# Patient Record
Sex: Female | Born: 1950
Health system: Southern US, Community
[De-identification: ages and names within clinical notes are randomized; demographics above are authoritative.]

## PROBLEM LIST (undated history)

## (undated) DIAGNOSIS — E785 Hyperlipidemia, unspecified: Secondary | ICD-10-CM

## (undated) DIAGNOSIS — I10 Essential (primary) hypertension: Secondary | ICD-10-CM

## (undated) DIAGNOSIS — E119 Type 2 diabetes mellitus without complications: Secondary | ICD-10-CM

## (undated) HISTORY — PX: REFRACTIVE SURGERY: SHX103

## (undated) HISTORY — DX: Type 2 diabetes mellitus without complications: E11.9

## (undated) HISTORY — PX: APPENDECTOMY: SHX54

## (undated) HISTORY — DX: Hyperlipidemia, unspecified: E78.5

## (undated) HISTORY — DX: Essential (primary) hypertension: I10

---

## 1973-09-13 HISTORY — PX: TONSILLECTOMY AND ADENOIDECTOMY: SHX28

## 2006-11-08 DIAGNOSIS — L538 Other specified erythematous conditions: Secondary | ICD-10-CM | POA: Insufficient documentation

## 2011-06-18 ENCOUNTER — Ambulatory Visit: Payer: Self-pay | Admitting: Internal Medicine

## 2011-06-23 ENCOUNTER — Ambulatory Visit: Payer: Self-pay | Admitting: Internal Medicine

## 2012-03-23 ENCOUNTER — Ambulatory Visit: Payer: Self-pay | Admitting: Family Medicine

## 2012-03-23 LAB — HM MAMMOGRAPHY

## 2013-06-22 ENCOUNTER — Ambulatory Visit: Payer: Self-pay | Admitting: Family Medicine

## 2014-01-31 DIAGNOSIS — M2390 Unspecified internal derangement of unspecified knee: Secondary | ICD-10-CM | POA: Insufficient documentation

## 2015-03-30 ENCOUNTER — Other Ambulatory Visit: Payer: Self-pay | Admitting: Family Medicine

## 2015-07-18 ENCOUNTER — Encounter: Payer: Self-pay | Admitting: Family Medicine

## 2015-07-18 ENCOUNTER — Ambulatory Visit (INDEPENDENT_AMBULATORY_CARE_PROVIDER_SITE_OTHER): Payer: PPO | Admitting: Family Medicine

## 2015-07-18 VITALS — BP 142/90 | HR 69 | Temp 98.3°F | Resp 16 | Ht 63.0 in | Wt 199.8 lb

## 2015-07-18 DIAGNOSIS — I10 Essential (primary) hypertension: Secondary | ICD-10-CM | POA: Insufficient documentation

## 2015-07-18 DIAGNOSIS — E7800 Pure hypercholesterolemia, unspecified: Secondary | ICD-10-CM | POA: Insufficient documentation

## 2015-07-18 DIAGNOSIS — E119 Type 2 diabetes mellitus without complications: Secondary | ICD-10-CM | POA: Insufficient documentation

## 2015-07-18 DIAGNOSIS — E049 Nontoxic goiter, unspecified: Secondary | ICD-10-CM | POA: Insufficient documentation

## 2015-07-18 DIAGNOSIS — R490 Dysphonia: Secondary | ICD-10-CM | POA: Diagnosis not present

## 2015-07-18 DIAGNOSIS — J309 Allergic rhinitis, unspecified: Secondary | ICD-10-CM | POA: Insufficient documentation

## 2015-07-18 MED ORDER — AMLODIPINE BESYLATE 5 MG PO TABS: 5.0000 mg | ORAL_TABLET | Freq: Every day | ORAL | Status: DC

## 2015-07-18 NOTE — Progress Notes (Signed)
Subjective:     Patient ID: Anne Parrish, female   DOB: February 13, 1951, 64 y.o.   MRN: [id number]  HPI  Chief Complaint  Patient presents with  . Hoarse    Patient comes in office today with concerns of hoarsness and fullness feeling in her throat for the past 4 days. Patient denies any cold like symptoms or sore throat.   States she is generally fatigued working for the [employer] Has run out of her bp medication. Denies overuse of her voice. Does take antacids at least once/week for heartburn. She occasionally notices heartburn after lying down.   Review of Systems  Constitutional: Negative for fever and chills.  HENT: Negative for postnasal drip and sore throat.   Respiratory: Negative for cough.        Objective:   Physical Exam  Constitutional: She appears well-developed and well-nourished. No distress.  HENT:  Mouth/Throat: Oropharynx is clear and moist.  Tonsils absent  Pulmonary/Chest: Breath sounds normal.  Lymphadenopathy:    She has no cervical adenopathy.       Assessment:    1. Hoarseness: ? Mediated by reflux   2. Essential hypertension - amLODipine (NORVASC) 5 MG tablet; Take 1 tablet (5 mg total) by mouth daily.  Dispense: 90 tablet; Refill: 0    Plan:    Dexilant 60 mg.one at bedtime #15. Reestablish with Dr. Venia Minks.Consider ENT referral.

## 2015-07-18 NOTE — Patient Instructions (Signed)
Start one Dexilant at bedtime. Reestablish with Dr. Venia Minks.

## 2015-08-13 ENCOUNTER — Encounter: Payer: Self-pay | Admitting: Family Medicine

## 2015-08-13 ENCOUNTER — Ambulatory Visit (INDEPENDENT_AMBULATORY_CARE_PROVIDER_SITE_OTHER): Payer: PPO | Admitting: Family Medicine

## 2015-08-13 VITALS — BP 152/92 | HR 76 | Temp 98.0°F | Resp 16 | Ht 63.0 in | Wt 194.0 lb

## 2015-08-13 DIAGNOSIS — E7800 Pure hypercholesterolemia, unspecified: Secondary | ICD-10-CM

## 2015-08-13 DIAGNOSIS — K219 Gastro-esophageal reflux disease without esophagitis: Secondary | ICD-10-CM

## 2015-08-13 DIAGNOSIS — I10 Essential (primary) hypertension: Secondary | ICD-10-CM | POA: Diagnosis not present

## 2015-08-13 DIAGNOSIS — Z23 Encounter for immunization: Secondary | ICD-10-CM | POA: Diagnosis not present

## 2015-08-13 DIAGNOSIS — E049 Nontoxic goiter, unspecified: Secondary | ICD-10-CM | POA: Diagnosis not present

## 2015-08-13 DIAGNOSIS — Z1231 Encounter for screening mammogram for malignant neoplasm of breast: Secondary | ICD-10-CM

## 2015-08-13 MED ORDER — HYDROCHLOROTHIAZIDE 12.5 MG PO CAPS: 12.5000 mg | ORAL_CAPSULE | Freq: Every day | ORAL | Status: DC

## 2015-08-13 MED ORDER — OMEPRAZOLE 20 MG PO CPDR: 20.0000 mg | DELAYED_RELEASE_CAPSULE | Freq: Every day | ORAL | Status: DC

## 2015-08-13 NOTE — Progress Notes (Signed)
Subjective:    Patient ID: Anne Parrish, female    DOB: Jun 29, 1951, 64 y.o.   MRN: [id number]  Hypertension Episode onset: FU from 07/18/2015. Pt saw Mikki Santee. BP was 142/90. Pt started on Amlodipine 5 mg. The problem is uncontrolled. Associated symptoms include malaise/fatigue, peripheral edema and sweats. Pertinent negatives include no anxiety, blurred vision, chest pain, headaches, neck pain, orthopnea, palpitations or shortness of breath. Risk factors for coronary artery disease include obesity, post-menopausal state and family history. Treatments tried: Amlodipine 5 mg. There are no compliance problems.    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  Has been very stressful. Worked very long hours.  Hoarseness Pt was started on Dexilant 60 mg po qd at [place]. Pt reports this problem has resolved.  Doing much better.  No longer taking medication.  Was      Review of Systems  Constitutional: Positive for malaise/fatigue.  Eyes: Negative for blurred vision.  Respiratory: Negative for shortness of breath.   Cardiovascular: Negative for chest pain, palpitations and orthopnea.  Musculoskeletal: Negative for neck pain.  Neurological: Negative for headaches.   BP 152/92 mmHg  Pulse 76  Temp(Src) 98 F (36.7 C) (Oral)  Resp 16  Ht 5\' 3"  (1.6 m)  Wt 194 lb (87.998 kg)  BMI 34.37 kg/m2   Patient Active Problem List   Diagnosis Date Noted  . Allergic rhinitis 07/18/2015  . Goiter 07/18/2015  . BP (high blood pressure) 07/18/2015  . Hypercholesteremia 07/18/2015   Past Medical History  Diagnosis Date  . Hypertension   . Hyperlipidemia    Current Outpatient Prescriptions on File Prior to Visit  Medication Sig  . amLODipine (NORVASC) 5 MG tablet Take 1 tablet (5 mg total) by mouth daily.  Marland Kitchen Bioflavonoid Products (BIOFLEX PO) Take by mouth.   No current facility-administered medications on file prior to visit.   No Known  Allergies Past Surgical History  Procedure Laterality Date  . Tonsillectomy and adenoidectomy  1975  . Refractive surgery    . Appendectomy  1960's   Social History   Social History  . Marital Status: Divorced    Spouse Name: N/A  . Number of Children: N/A  . Years of Education: N/A   Occupational History  . Not on file.   Social History Main Topics  . Smoking status: Never Smoker   . Smokeless tobacco: Never Used  . Alcohol Use: No  . Drug Use: No  . Sexual Activity: Not on file   Other Topics Concern  . Not on file   Social History Narrative   Family History  Problem Relation Age of Onset  . Cancer Mother     lung  . Thyroid disease Mother   . Cancer Father     prostate  . Heart disease Father   . Cancer Brother     prostate      Objective:   Physical Exam  Constitutional: She is oriented to person, place, and time. She appears well-developed and well-nourished.  Cardiovascular: Normal rate and regular rhythm.   Pulmonary/Chest: Effort normal and breath sounds normal.  Neurological: She is alert and oriented to person, place, and time.  Psychiatric: She has a normal mood and affect. Her behavior is normal. Judgment and thought content normal.   BP 152/92 mmHg  Pulse 76  Temp(Src) 98 F (36.7 C) (Oral)  Resp 16  Ht 5\' 3"  (1.6 m)  Wt 194  lb (87.998 kg)  BMI 34.37 kg/m2      Assessment & Plan:  1. Essential hypertension BP ups today. Will add HCTZ, check labs and recheck in 4 weeks.   - CBC with Differential/Platelet - hydrochlorothiazide (MICROZIDE) 12.5 MG capsule; Take 1 capsule (12.5 mg total) by mouth daily.  Dispense: 30 capsule; Refill: 5  2. Goiter Check labs.  - TSH  3. Hypercholesteremia Recheck labs.   - Lipid panel - Comprehensive Metabolic Panel (CMET)  4. Gastroesophageal reflux disease without esophagitis Hoarseness did improve with Dexilant. Will try generic.   - omeprazole (PRILOSEC) 20 MG capsule; Take 1 capsule (20 mg  total) by mouth daily.  Dispense: 30 capsule; Refill: 3  5. Need for pneumococcal vaccination Give today. Refused flu shot.  - Pneumococcal conjugate vaccine 13-valent IM  6. Encounter for screening mammogram for breast cancer Patient will call and schedule.    - MM DIGITAL SCREENING BILATERAL; Future   Margarita Rana, MD

## 2015-08-26 ENCOUNTER — Ambulatory Visit
Admission: RE | Admit: 2015-08-26 | Discharge: 2015-08-26 | Disposition: A | Discharge status: Home / Self Care | Payer: PPO | Source: Ambulatory Visit | Attending: Family Medicine | Admitting: Family Medicine

## 2015-08-26 DIAGNOSIS — Z1231 Encounter for screening mammogram for malignant neoplasm of breast: Secondary | ICD-10-CM

## 2015-08-27 ENCOUNTER — Other Ambulatory Visit: Payer: Self-pay | Admitting: Family Medicine

## 2015-08-27 DIAGNOSIS — R928 Other abnormal and inconclusive findings on diagnostic imaging of breast: Secondary | ICD-10-CM

## 2015-08-27 DIAGNOSIS — N6489 Other specified disorders of breast: Secondary | ICD-10-CM

## 2015-09-30 ENCOUNTER — Ambulatory Visit
Admission: RE | Admit: 2015-09-30 | Discharge: 2015-09-30 | Disposition: A | Discharge status: Home / Self Care | Payer: PPO | Source: Ambulatory Visit | Attending: Family Medicine | Admitting: Family Medicine

## 2015-09-30 DIAGNOSIS — R928 Other abnormal and inconclusive findings on diagnostic imaging of breast: Secondary | ICD-10-CM | POA: Insufficient documentation

## 2015-09-30 DIAGNOSIS — N6489 Other specified disorders of breast: Secondary | ICD-10-CM

## 2015-10-08 DIAGNOSIS — R7309 Other abnormal glucose: Secondary | ICD-10-CM | POA: Diagnosis not present

## 2015-10-08 DIAGNOSIS — E049 Nontoxic goiter, unspecified: Secondary | ICD-10-CM | POA: Diagnosis not present

## 2015-10-08 DIAGNOSIS — E7800 Pure hypercholesterolemia, unspecified: Secondary | ICD-10-CM | POA: Diagnosis not present

## 2015-10-08 DIAGNOSIS — I10 Essential (primary) hypertension: Secondary | ICD-10-CM | POA: Diagnosis not present

## 2015-10-09 ENCOUNTER — Telehealth: Payer: Self-pay

## 2015-10-09 DIAGNOSIS — R7309 Other abnormal glucose: Secondary | ICD-10-CM

## 2015-10-09 LAB — CBC WITH DIFFERENTIAL/PLATELET
Basophils Absolute: 0 10*3/uL (ref 0.0–0.2)
Basos: 1 %
EOS (ABSOLUTE): 0.3 10*3/uL (ref 0.0–0.4)
EOS: 5 %
HEMATOCRIT: 41.5 % (ref 34.0–46.6)
HEMOGLOBIN: 13.9 g/dL (ref 11.1–15.9)
Immature Grans (Abs): 0 10*3/uL (ref 0.0–0.1)
Immature Granulocytes: 0 %
LYMPHS ABS: 1.7 10*3/uL (ref 0.7–3.1)
Lymphs: 30 %
MCH: 28.4 pg (ref 26.6–33.0)
MCHC: 33.5 g/dL (ref 31.5–35.7)
MCV: 85 fL (ref 79–97)
MONOCYTES: 7 %
Monocytes Absolute: 0.4 10*3/uL (ref 0.1–0.9)
NEUTROS ABS: 3.2 10*3/uL (ref 1.4–7.0)
Neutrophils: 57 %
Platelets: 284 10*3/uL (ref 150–379)
RBC: 4.9 x10E6/uL (ref 3.77–5.28)
RDW: 14.2 % (ref 12.3–15.4)
WBC: 5.5 10*3/uL (ref 3.4–10.8)

## 2015-10-09 LAB — COMPREHENSIVE METABOLIC PANEL
ALBUMIN: 4.3 g/dL (ref 3.6–4.8)
ALT: 20 IU/L (ref 0–32)
AST: 22 IU/L (ref 0–40)
Albumin/Globulin Ratio: 1.6 (ref 1.1–2.5)
Alkaline Phosphatase: 92 IU/L (ref 39–117)
BILIRUBIN TOTAL: 0.8 mg/dL (ref 0.0–1.2)
BUN / CREAT RATIO: 12 (ref 11–26)
BUN: 11 mg/dL (ref 8–27)
CO2: 25 mmol/L (ref 18–29)
CREATININE: 0.9 mg/dL (ref 0.57–1.00)
Calcium: 9.8 mg/dL (ref 8.7–10.3)
Chloride: 99 mmol/L (ref 96–106)
GFR calc non Af Amer: 67 mL/min/{1.73_m2} (ref >59)
GFR, EST AFRICAN AMERICAN: 78 mL/min/{1.73_m2} (ref >59)
GLUCOSE: 221 mg/dL — AB (ref 65–99)
Globulin, Total: 2.7 g/dL (ref 1.5–4.5)
Potassium: 4 mmol/L (ref 3.5–5.2)
Sodium: 142 mmol/L (ref 134–144)
TOTAL PROTEIN: 7 g/dL (ref 6.0–8.5)

## 2015-10-09 LAB — LIPID PANEL
CHOL/HDL RATIO: 4 ratio (ref 0.0–4.4)
CHOLESTEROL TOTAL: 245 mg/dL — AB (ref 100–199)
HDL: 61 mg/dL (ref >39)
LDL CALC: 154 mg/dL — AB (ref 0–99)
TRIGLYCERIDES: 152 mg/dL — AB (ref 0–149)
VLDL CHOLESTEROL CAL: 30 mg/dL (ref 5–40)

## 2015-10-09 LAB — TSH: TSH: 2.37 u[IU]/mL (ref 0.450–4.500)

## 2015-10-09 NOTE — Telephone Encounter (Signed)
Patient advised as below. Patient has an appt on 10/16/15 and would like to talk more about her treatment option at that time. sd

## 2015-10-09 NOTE — Telephone Encounter (Signed)
-----   Message from Margarita Rana, MD sent at 10/09/2015  9:16 AM EST ----- See note. PLease add hgbA1c, also cholesterol is elevated, 10 year risk of heart disease is 21 percent and statin is recommended. Please see if patient would like to start a medication. Also, recommend baby asa daily. Thanks.

## 2015-10-09 NOTE — Telephone Encounter (Signed)
Called [facility] to add order.

## 2015-10-10 LAB — SPECIMEN STATUS REPORT

## 2015-10-10 LAB — HGB A1C W/O EAG: HEMOGLOBIN A1C: 8.8 % — AB (ref 4.8–5.6)

## 2015-10-16 ENCOUNTER — Ambulatory Visit (INDEPENDENT_AMBULATORY_CARE_PROVIDER_SITE_OTHER): Payer: PPO | Admitting: Family Medicine

## 2015-10-16 ENCOUNTER — Encounter: Payer: Self-pay | Admitting: Family Medicine

## 2015-10-16 VITALS — BP 158/102 | HR 72 | Temp 98.2°F | Resp 16 | Ht 62.5 in | Wt 191.0 lb

## 2015-10-16 DIAGNOSIS — I10 Essential (primary) hypertension: Secondary | ICD-10-CM

## 2015-10-16 DIAGNOSIS — E7800 Pure hypercholesterolemia, unspecified: Secondary | ICD-10-CM

## 2015-10-16 DIAGNOSIS — K219 Gastro-esophageal reflux disease without esophagitis: Secondary | ICD-10-CM | POA: Diagnosis not present

## 2015-10-16 DIAGNOSIS — E1165 Type 2 diabetes mellitus with hyperglycemia: Secondary | ICD-10-CM | POA: Insufficient documentation

## 2015-10-16 DIAGNOSIS — IMO0002 Reserved for concepts with insufficient information to code with codable children: Secondary | ICD-10-CM | POA: Insufficient documentation

## 2015-10-16 DIAGNOSIS — IMO0001 Reserved for inherently not codable concepts without codable children: Secondary | ICD-10-CM

## 2015-10-16 MED ORDER — ONETOUCH DELICA LANCETS FINE MISC: Status: DC

## 2015-10-16 MED ORDER — ASPIRIN 81 MG PO TABS: 81.0000 mg | ORAL_TABLET | Freq: Every day | ORAL | Status: DC

## 2015-10-16 MED ORDER — AMLODIPINE BESYLATE 5 MG PO TABS: 5.0000 mg | ORAL_TABLET | Freq: Every day | ORAL | Status: DC

## 2015-10-16 MED ORDER — GLUCOSE BLOOD VI STRP: ORAL_STRIP | Status: DC

## 2015-10-16 MED ORDER — ATORVASTATIN CALCIUM 10 MG PO TABS: 10.0000 mg | ORAL_TABLET | Freq: Every day | ORAL | Status: DC

## 2015-10-16 MED ORDER — LISINOPRIL-HYDROCHLOROTHIAZIDE 10-12.5 MG PO TABS: 1.0000 | ORAL_TABLET | Freq: Every day | ORAL | Status: DC

## 2015-10-16 NOTE — Progress Notes (Signed)
Patient: Anne Parrish Female    DOB: 1951-01-30   65 y.o.   MRN: [id number] Visit Date: 10/16/2015  Today's Provider: Margarita Rana, MD   Chief Complaint  Patient presents with  . Diabetes   Subjective:    HPI Diabetes Mellitus Type II, Initial Visit: Patient here for an initial evaluation of Type 2 diabetes mellitus.  Current symptoms/problems include none and have been worsening.  The patient was initially diagnosed with Type 2 diabetes mellitus based on the following criteria:   HgbA1c  8.8.   Known diabetic complications: none Cardiovascular risk factors: advanced age (older than 44 for men, 22 for women), hypercholesterolemia.  Current diabetic medications include none.   Eye exam current (within one year): no Weight trend: stable Prior visit with dietician No Current diet: in general, a "healthy" diet   Has decreased sweets, increased salad, healthy foods and trying to look at diet on line.   Current exercise: no regular exercise, does walk up and down steps. Going to start walking with a friend.   Current monitoring regimen: none Any episodes of hypoglycemia?  Yes.  Educated as to the symptoms.    Is She on ACE inhibitor or angiotensin II receptor blocker?  Will start Lisinopril today.   Also with increased cholesterol today. Has not been on cholesterol medication in the past. 10 year risk of heart disease is over 20 percent.   Blood pressure still elevated today.  Did not take her medication today but does take it regularly. Does not have any side effects.        No Known Allergies Previous Medications   ACETAMINOPHEN (TYLENOL) 100 MG/ML SOLUTION    Take 10 mg/kg by mouth every 4 (four) hours as needed for fever.   AMLODIPINE (NORVASC) 5 MG TABLET    Take 1 tablet (5 mg total) by mouth daily.   BIOFLAVONOID PRODUCTS (BIOFLEX PO)    Take by mouth.   CHLORPHENIRAMINE (CHLOR-TRIMETON) 4 MG TABLET    Take 4 mg by mouth 2 (two) times daily as needed for  allergies.   HYDROCHLOROTHIAZIDE (MICROZIDE) 12.5 MG CAPSULE    Take 1 capsule (12.5 mg total) by mouth daily.   MULTIPLE VITAMIN (MULTIVITAMIN) CAPSULE    Take 1 capsule by mouth daily.   NAPROXEN SODIUM (ANAPROX) 220 MG TABLET    Take 220 mg by mouth 2 (two) times daily with a meal.   OMEPRAZOLE (PRILOSEC) 20 MG CAPSULE    Take 1 capsule (20 mg total) by mouth daily.   POTASSIUM GLUCONATE 595 MG TABS TABLET    Take 595 mg by mouth.   PROBIOTIC PRODUCT (PROBIOTIC DAILY PO)    Take by mouth.    Review of Systems  Constitutional: Negative.   Eyes: Negative.   Respiratory: Negative.   Psychiatric/Behavioral: Negative.     Social History  Substance Use Topics  . Smoking status: Never Smoker   . Smokeless tobacco: Never Used  . Alcohol Use: No   Objective:   BP 158/102 mmHg  Pulse 72  Temp(Src) 98.2 F (36.8 C) (Oral)  Resp 16  Ht 5' 2.5" (1.588 m)  Wt 191 lb (86.637 kg)  BMI 34.36 kg/m2  Physical Exam  Constitutional: She is oriented to person, place, and time. She appears well-developed and well-nourished.  Neurological: She is alert and oriented to person, place, and time.  Psychiatric: She has a normal mood and affect. Her behavior is normal. Judgment and thought content normal.  Results for orders placed or performed in visit on 08/13/15  Lipid panel  Result Value Ref Range   Cholesterol, Total 245 (H) 100 - 199 mg/dL   Triglycerides 152 (H) 0 - 149 mg/dL   HDL 61 >39 mg/dL   VLDL Cholesterol Cal 30 5 - 40 mg/dL   LDL Calculated 154 (H) 0 - 99 mg/dL   Chol/HDL Ratio 4.0 0.0 - 4.4 ratio units  TSH  Result Value Ref Range   TSH 2.370 0.450 - 4.500 uIU/mL  Comprehensive Metabolic Panel (CMET)  Result Value Ref Range   Glucose 221 (H) 65 - 99 mg/dL   BUN 11 8 - 27 mg/dL   Creatinine, Ser 0.90 0.57 - 1.00 mg/dL   GFR calc non Af Amer 67 >59 mL/min/1.73   GFR calc Af Amer 78 >59 mL/min/1.73   BUN/Creatinine Ratio 12 11 - 26   Sodium 142 134 - 144 mmol/L    Potassium 4.0 3.5 - 5.2 mmol/L   Chloride 99 96 - 106 mmol/L   CO2 25 18 - 29 mmol/L   Calcium 9.8 8.7 - 10.3 mg/dL   Total Protein 7.0 6.0 - 8.5 g/dL   Albumin 4.3 3.6 - 4.8 g/dL   Globulin, Total 2.7 1.5 - 4.5 g/dL   Albumin/Globulin Ratio 1.6 1.1 - 2.5   Bilirubin Total 0.8 0.0 - 1.2 mg/dL   Alkaline Phosphatase 92 39 - 117 IU/L   AST 22 0 - 40 IU/L   ALT 20 0 - 32 IU/L  CBC with Differential/Platelet  Result Value Ref Range   WBC 5.5 3.4 - 10.8 x10E3/uL   RBC 4.90 3.77 - 5.28 x10E6/uL   Hemoglobin 13.9 11.1 - 15.9 g/dL   Hematocrit 41.5 34.0 - 46.6 %   MCV 85 79 - 97 fL   MCH 28.4 26.6 - 33.0 pg   MCHC 33.5 31.5 - 35.7 g/dL   RDW 14.2 12.3 - 15.4 %   Platelets 284 150 - 379 x10E3/uL   Neutrophils 57 %   Lymphs 30 %   Monocytes 7 %   Eos 5 %   Basos 1 %   Neutrophils Absolute 3.2 1.4 - 7.0 x10E3/uL   Lymphocytes Absolute 1.7 0.7 - 3.1 x10E3/uL   Monocytes Absolute 0.4 0.1 - 0.9 x10E3/uL   EOS (ABSOLUTE) 0.3 0.0 - 0.4 x10E3/uL   Basophils Absolute 0.0 0.0 - 0.2 x10E3/uL   Immature Granulocytes 0 %   Immature Grans (Abs) 0.0 0.0 - 0.1 x10E3/uL  Hgb A1c w/o eAG  Result Value Ref Range   Hgb A1c MFr Bld 8.8 (H) 4.8 - 5.6 %  Specimen status report  Result Value Ref Range   specimen status report Comment        Assessment & Plan:     1. Uncontrolled type 2 diabetes mellitus without complication, without long-term current use of insulin ([place]) New problem. Taught how to check FSBG today. Will refer to [place].   Recheck in 3 months. Call and will start medication if not reaching goal of 120 to 140 in am with lifestyle.   - Amb Referral to Nutrition and Diabetic E - glucose blood (ONETOUCH VERIO) test strip; To check blood sugar once a day.  DX: E11.9  Dispense: 100 each; Refill: 3 - [place]; To check blood sugars once a day.  DX:  E11.9  Dispense: 100 each; Refill: 1  2. Gastroesophageal reflux disease without esophagitis Will change  to as needed.  3. Essential hypertension Not at goal. Will add ACE in light of diabetes. Warned about allergic reaction, cough. Will continue amlodipine, stop HCTZ and potassium.  Recheck labs in 4 weeks.  Follow up for Wellness in 6 weeks.   - lisinopril-hydrochlorothiazide (PRINZIDE,ZESTORETIC) 10-12.5 MG tablet; Take 1 tablet by mouth daily.  Dispense: 90 tablet; Refill: 3 - amLODipine (NORVASC) 5 MG tablet; Take 1 tablet (5 mg total) by mouth daily.  Dispense: 90 tablet; Refill: 1  4. Hypercholesteremia Will start medication, stop if any cramps or muscle symptoms. Recheck labs in 4 weeks.  - atorvastatin (LIPITOR) 10 MG tablet; Take 1 tablet (10 mg total) by mouth daily.  Dispense: 90 tablet; Refill: 3 - aspirin 81 MG tablet; Take 1 tablet (81 mg total) by mouth daily.  Dispense: 1 tablet; Refill: 0       Margarita Rana, MD  [facility]

## 2015-12-09 ENCOUNTER — Encounter: Payer: PPO | Admitting: Family Medicine

## 2015-12-22 ENCOUNTER — Encounter: Payer: PPO | Admitting: Family Medicine

## 2015-12-22 ENCOUNTER — Telehealth: Payer: Self-pay | Admitting: Family Medicine

## 2015-12-22 NOTE — Telephone Encounter (Signed)
Pt advised-aa 

## 2015-12-22 NOTE — Telephone Encounter (Signed)
Will order labs at ov if needed. Need to review records, etc first.   Thanks.

## 2015-12-22 NOTE — Telephone Encounter (Signed)
Please review-aa 

## 2015-12-22 NOTE — Telephone Encounter (Signed)
Pt has and appt coming up and wants to come by and pick up her lab sheets before hand.  Patient would like to pick up today.  She is going out of town,  Her call back is [phone]  [employer]

## 2015-12-30 ENCOUNTER — Encounter: Payer: Self-pay | Admitting: Family Medicine

## 2015-12-30 ENCOUNTER — Ambulatory Visit (INDEPENDENT_AMBULATORY_CARE_PROVIDER_SITE_OTHER): Payer: PPO | Admitting: Family Medicine

## 2015-12-30 VITALS — BP 132/84 | HR 64 | Temp 97.7°F | Resp 16 | Ht 63.0 in | Wt 170.0 lb

## 2015-12-30 DIAGNOSIS — Z0000 Encounter for general adult medical examination without abnormal findings: Secondary | ICD-10-CM

## 2015-12-30 DIAGNOSIS — Z1211 Encounter for screening for malignant neoplasm of colon: Secondary | ICD-10-CM | POA: Diagnosis not present

## 2015-12-30 DIAGNOSIS — Z780 Asymptomatic menopausal state: Secondary | ICD-10-CM | POA: Diagnosis not present

## 2015-12-30 NOTE — Progress Notes (Signed)
Patient ID: Anne Parrish, female   DOB: 1951-02-16, 65 y.o.   MRN: [id number]       Patient: Anne Parrish, Female    DOB: 1950-11-22, 65 y.o.   MRN: [id number] Visit Date: 12/30/2015  Today's Provider: Margarita Rana, MD   Chief Complaint  Patient presents with  . Medicare Wellness   Subjective:    Annual wellness visit Anne CHOKSHI is a 65 y.o. female. She feels well. She reports exercising walking daily. She reports she is sleeping well.  03/13/12 CPE 03/13/12 Pap-neg; HPV-neg 09/30/15 Mammogram-BI-RADS 1 -----------------------------------------------------------   Review of Systems  Constitutional: Negative.   HENT: Negative.   Eyes: Negative.   Respiratory: Negative.   Cardiovascular: Negative.   Gastrointestinal: Negative.   Endocrine: Negative.   Genitourinary: Negative.   Musculoskeletal: Negative.   Skin: Negative.   Allergic/Immunologic: Negative.   Neurological: Negative.   Hematological: Negative.   Psychiatric/Behavioral: Negative.     Social History   Social History  . Marital Status: Divorced    Spouse Name: N/A  . Number of Children: N/A  . Years of Education: N/A   Occupational History  . Not on file.   Social History Main Topics  . Smoking status: Never Smoker   . Smokeless tobacco: Never Used  . Alcohol Use: No  . Drug Use: No  . Sexual Activity: Not on file   Other Topics Concern  . Not on file   Social History Narrative    Past Medical History  Diagnosis Date  . Hypertension   . Hyperlipidemia      Patient Active Problem List   Diagnosis Date Noted  . Diabetes type 2, uncontrolled ([place]) 10/16/2015  . GERD (gastroesophageal reflux disease) 08/13/2015  . Allergic rhinitis 07/18/2015  . Goiter 07/18/2015  . BP (high blood pressure) 07/18/2015  . Hypercholesteremia 07/18/2015    Past Surgical History  Procedure Laterality Date  . Tonsillectomy and adenoidectomy  1975  . Refractive surgery    . Appendectomy  1960's     Her family history includes Cancer in her brother, father, and mother; Heart disease in her father; Thyroid disease in her mother.    Previous Medications   AMLODIPINE (NORVASC) 5 MG TABLET    Take 1 tablet (5 mg total) by mouth daily.   ASPIRIN 81 MG TABLET    Take 1 tablet (81 mg total) by mouth daily.   ATORVASTATIN (LIPITOR) 10 MG TABLET    Take 1 tablet (10 mg total) by mouth daily.   BIOFLAVONOID PRODUCTS (BIOFLEX PO)    Take by mouth.   GLUCOSE BLOOD (ONETOUCH VERIO) TEST STRIP    To check blood sugar once a day.  DX: E11.9   LISINOPRIL-HYDROCHLOROTHIAZIDE (PRINZIDE,ZESTORETIC) 10-12.5 MG TABLET    Take 1 tablet by mouth daily.   MULTIPLE VITAMIN (MULTIVITAMIN) CAPSULE    Take 1 capsule by mouth daily.   OMEPRAZOLE (PRILOSEC) 20 MG CAPSULE    Take 1 capsule (20 mg total) by mouth daily.   ONETOUCH DELICA LANCETS FINE MISC    To check blood sugars once a day.  DX:  E11.9   PROBIOTIC PRODUCT (PROBIOTIC DAILY PO)    Take by mouth.    Patient Care Team: Margarita Rana, MD as PCP - General (Family Medicine)     Objective:   Vitals: BP 132/84 mmHg  Pulse 64  Temp(Src) 97.7 F (36.5 C) (Oral)  Resp 16  Ht 5\' 3"  (1.6 m)  Wt 170 lb (77.111 kg)  BMI 30.12 kg/m2  SpO2 98%  Physical Exam  Constitutional: She is oriented to person, place, and time. She appears well-developed and well-nourished.  Cardiovascular: Normal rate, regular rhythm and normal heart sounds.   Pulmonary/Chest: Effort normal and breath sounds normal.  Neurological: She is alert and oriented to person, place, and time.    Activities of Daily Living In your present state of health, do you have any difficulty performing the following activities: 12/30/2015  Hearing? N  Vision? N  Difficulty concentrating or making decisions? N  Walking or climbing stairs? N  Dressing or bathing? N  Doing errands, shopping? N    Fall Risk Assessment Fall Risk  12/30/2015 10/16/2015  Falls in the past year? No No      Depression Screen PHQ 2/9 Scores 12/30/2015 10/16/2015  PHQ - 2 Score 0 0    Cognitive Testing - 6-CIT  Correct? Score   What year is it? yes 0 0 or 4  What month is it? yes 0 0 or 3  Memorize:    Anne Parrish,  41,  [address],  [place],      What time is it? (within 1 hour) yes 0 0 or 3  Count backwards from 20 yes 0 0, 2, or 4  Name the months of the year yes 0 0, 2, or 4  Repeat name & address above yes 0 0, 2, 4, 6, 8, or 10       TOTAL SCORE  0/28   Interpretation:  Normal  Normal (0-7) Abnormal (8-28)       Assessment & Plan:     Annual Wellness Visit  Reviewed patient's Family Medical History Reviewed and updated list of patient's medical providers Assessment of cognitive impairment was done Assessed patient's functional ability Established a written schedule for health screening [place] Completed and Reviewed  Exercise Activities and Dietary recommendations Goals    None      Immunization History  Administered Date(s) Administered  . Pneumococcal Conjugate-13 08/13/2015      1. Medicare annual wellness visit, initial As above. - EKG 12-Lead  2. Colon cancer screening - Ambulatory referral to Gastroenterology  3. Postmenopausal - DG Bone Density; Future   Patient seen and examined by Dr. Jerrell Belfast, and note scribed by Philbert Riser. Dimas, CMA.  I have reviewed the document for accuracy and completeness and I agree with above. Jerrell Belfast, MD   Margarita Rana, MD   ------------------------------------------------------------------------------------------------------------

## 2016-01-15 ENCOUNTER — Other Ambulatory Visit: Payer: Self-pay | Admitting: Family Medicine

## 2016-01-15 DIAGNOSIS — I10 Essential (primary) hypertension: Secondary | ICD-10-CM

## 2016-01-16 ENCOUNTER — Other Ambulatory Visit: Payer: Self-pay | Admitting: Family Medicine

## 2016-01-16 DIAGNOSIS — K219 Gastro-esophageal reflux disease without esophagitis: Secondary | ICD-10-CM

## 2016-01-19 ENCOUNTER — Telehealth: Payer: Self-pay

## 2016-01-19 ENCOUNTER — Ambulatory Visit
Admission: RE | Admit: 2016-01-19 | Discharge: 2016-01-19 | Disposition: A | Discharge status: Home / Self Care | Payer: PPO | Source: Ambulatory Visit | Attending: Family Medicine | Admitting: Family Medicine

## 2016-01-19 DIAGNOSIS — M8580 Other specified disorders of bone density and structure, unspecified site: Secondary | ICD-10-CM | POA: Diagnosis not present

## 2016-01-19 DIAGNOSIS — Z780 Asymptomatic menopausal state: Secondary | ICD-10-CM | POA: Diagnosis not present

## 2016-01-19 DIAGNOSIS — M85851 Other specified disorders of bone density and structure, right thigh: Secondary | ICD-10-CM | POA: Diagnosis not present

## 2016-01-19 DIAGNOSIS — Z13820 Encounter for screening for osteoporosis: Secondary | ICD-10-CM | POA: Insufficient documentation

## 2016-01-19 NOTE — Telephone Encounter (Signed)
Pt advised.  She is going to do some research first and then give [place] a call back.   Thanks,   -Mickel Baas

## 2016-01-19 NOTE — Telephone Encounter (Signed)
-----   Message from Margarita Rana, MD sent at 01/19/2016  9:17 AM EDT ----- Bone density right at cut of point for diagnosis and treatment for osteoporosis,  10 year risk of fracture is 20 percent. OV if would like to consider starting medication. If not, increase weight bearing exercise and check Vit D level. Thanks.

## 2016-01-21 ENCOUNTER — Ambulatory Visit: Payer: PPO | Admitting: Family Medicine

## 2016-01-22 DIAGNOSIS — E119 Type 2 diabetes mellitus without complications: Secondary | ICD-10-CM | POA: Diagnosis not present

## 2016-01-22 DIAGNOSIS — H40013 Open angle with borderline findings, low risk, bilateral: Secondary | ICD-10-CM | POA: Diagnosis not present

## 2016-01-22 LAB — HM DIABETES EYE EXAM

## 2016-01-27 ENCOUNTER — Encounter: Payer: PPO | Admitting: Family Medicine

## 2016-02-02 DIAGNOSIS — Z1211 Encounter for screening for malignant neoplasm of colon: Secondary | ICD-10-CM | POA: Diagnosis not present

## 2016-03-23 DIAGNOSIS — Z5321 Procedure and treatment not carried out due to patient leaving prior to being seen by health care provider: Secondary | ICD-10-CM | POA: Insufficient documentation

## 2016-03-23 DIAGNOSIS — R1111 Vomiting without nausea: Secondary | ICD-10-CM | POA: Insufficient documentation

## 2016-03-23 LAB — COMPREHENSIVE METABOLIC PANEL
ALK PHOS: 90 U/L (ref 38–126)
ALT: 26 U/L (ref 14–54)
ANION GAP: 11 (ref 5–15)
AST: 31 U/L (ref 15–41)
Albumin: 4.2 g/dL (ref 3.5–5.0)
BUN: 11 mg/dL (ref 6–20)
CO2: 23 mmol/L (ref 22–32)
Calcium: 9.4 mg/dL (ref 8.9–10.3)
Chloride: 92 mmol/L — ABNORMAL LOW (ref 101–111)
Creatinine, Ser: 0.83 mg/dL (ref 0.44–1.00)
GLUCOSE: 184 mg/dL — AB (ref 65–99)
POTASSIUM: 2.9 mmol/L — AB (ref 3.5–5.1)
SODIUM: 126 mmol/L — AB (ref 135–145)
TOTAL PROTEIN: 7.3 g/dL (ref 6.5–8.1)
Total Bilirubin: 1.8 mg/dL — ABNORMAL HIGH (ref 0.3–1.2)

## 2016-03-23 LAB — CBC
HEMATOCRIT: 37.3 % (ref 35.0–47.0)
HEMOGLOBIN: 13.3 g/dL (ref 12.0–16.0)
MCH: 29.8 pg (ref 26.0–34.0)
MCHC: 35.7 g/dL (ref 32.0–36.0)
MCV: 83.6 fL (ref 80.0–100.0)
Platelets: 299 10*3/uL (ref 150–440)
RBC: 4.46 MIL/uL (ref 3.80–5.20)
RDW: 12.6 % (ref 11.5–14.5)
WBC: 9 10*3/uL (ref 3.6–11.0)

## 2016-03-23 LAB — GLUCOSE, CAPILLARY: Glucose-Capillary: 179 mg/dL — ABNORMAL HIGH (ref 65–99)

## 2016-03-23 MED ORDER — ONDANSETRON 4 MG PO TBDP
4.0000 mg | ORAL_TABLET | Freq: Once | ORAL | Status: AC
  Administered 2016-03-23: 4 mg via ORAL

## 2016-03-23 MED ORDER — ONDANSETRON 4 MG PO TBDP
ORAL_TABLET | ORAL | Status: AC
  Administered 2016-03-23: 4 mg via ORAL
  Filled 2016-03-23: qty 1

## 2016-03-23 NOTE — ED Notes (Signed)
Pt started prep for colonoscopy scheduled tomorrow started vomiting around 1900 no abd pain, here for feeling shaky.

## 2016-03-24 ENCOUNTER — Ambulatory Visit: Admission: RE | Admit: 2016-03-24 | Payer: PPO | Source: Ambulatory Visit | Admitting: Unknown Physician Specialty

## 2016-03-24 ENCOUNTER — Emergency Department
Admission: EM | Admit: 2016-03-24 | Discharge: 2016-03-24 | Disposition: A | Discharge status: Home / Self Care | Payer: PPO | Attending: Emergency Medicine | Admitting: Emergency Medicine

## 2016-03-24 ENCOUNTER — Encounter: Admission: RE | Payer: Self-pay | Source: Ambulatory Visit

## 2016-03-24 SURGERY — COLONOSCOPY WITH PROPOFOL
Anesthesia: General

## 2016-04-14 ENCOUNTER — Encounter: Payer: Self-pay | Admitting: *Deleted

## 2016-04-15 ENCOUNTER — Ambulatory Visit
Admission: RE | Admit: 2016-04-15 | Discharge: 2016-04-15 | Disposition: A | Discharge status: Home / Self Care | Payer: PPO | Source: Ambulatory Visit | Attending: Unknown Physician Specialty | Admitting: Unknown Physician Specialty

## 2016-04-15 ENCOUNTER — Encounter: Payer: Self-pay | Admitting: *Deleted

## 2016-04-15 ENCOUNTER — Encounter
Admission: RE | Disposition: A | Discharge status: Home / Self Care | Payer: Self-pay | Source: Ambulatory Visit | Attending: Unknown Physician Specialty

## 2016-04-15 ENCOUNTER — Ambulatory Visit: Payer: PPO | Admitting: Anesthesiology

## 2016-04-15 DIAGNOSIS — E119 Type 2 diabetes mellitus without complications: Secondary | ICD-10-CM | POA: Insufficient documentation

## 2016-04-15 DIAGNOSIS — I10 Essential (primary) hypertension: Secondary | ICD-10-CM | POA: Diagnosis not present

## 2016-04-15 DIAGNOSIS — Z1211 Encounter for screening for malignant neoplasm of colon: Secondary | ICD-10-CM | POA: Insufficient documentation

## 2016-04-15 DIAGNOSIS — Z7982 Long term (current) use of aspirin: Secondary | ICD-10-CM | POA: Insufficient documentation

## 2016-04-15 DIAGNOSIS — K621 Rectal polyp: Secondary | ICD-10-CM | POA: Insufficient documentation

## 2016-04-15 DIAGNOSIS — E785 Hyperlipidemia, unspecified: Secondary | ICD-10-CM | POA: Insufficient documentation

## 2016-04-15 DIAGNOSIS — Z79899 Other long term (current) drug therapy: Secondary | ICD-10-CM | POA: Diagnosis not present

## 2016-04-15 DIAGNOSIS — D123 Benign neoplasm of transverse colon: Secondary | ICD-10-CM | POA: Insufficient documentation

## 2016-04-15 DIAGNOSIS — K219 Gastro-esophageal reflux disease without esophagitis: Secondary | ICD-10-CM | POA: Diagnosis not present

## 2016-04-15 DIAGNOSIS — D128 Benign neoplasm of rectum: Secondary | ICD-10-CM | POA: Diagnosis not present

## 2016-04-15 DIAGNOSIS — K635 Polyp of colon: Secondary | ICD-10-CM | POA: Diagnosis not present

## 2016-04-15 DIAGNOSIS — K648 Other hemorrhoids: Secondary | ICD-10-CM | POA: Diagnosis not present

## 2016-04-15 DIAGNOSIS — K640 First degree hemorrhoids: Secondary | ICD-10-CM | POA: Insufficient documentation

## 2016-04-15 HISTORY — PX: COLONOSCOPY WITH PROPOFOL: SHX5780

## 2016-04-15 LAB — SURGICAL PATHOLOGY

## 2016-04-15 SURGERY — COLONOSCOPY WITH PROPOFOL
Anesthesia: General

## 2016-04-15 MED ORDER — SODIUM CHLORIDE 0.9 % IV SOLN: INTRAVENOUS | Status: DC

## 2016-04-15 MED ORDER — PROPOFOL 10 MG/ML IV BOLUS
INTRAVENOUS | Status: DC | PRN
  Administered 2016-04-15: 70 mg via INTRAVENOUS

## 2016-04-15 MED ORDER — LIDOCAINE HCL (CARDIAC) 20 MG/ML IV SOLN
INTRAVENOUS | Status: DC | PRN
  Administered 2016-04-15: 30 mg via INTRAVENOUS

## 2016-04-15 MED ORDER — PROPOFOL 500 MG/50ML IV EMUL
INTRAVENOUS | Status: DC | PRN
  Administered 2016-04-15: 125 ug/kg/min via INTRAVENOUS

## 2016-04-15 MED ORDER — SODIUM CHLORIDE 0.9 % IV SOLN
INTRAVENOUS | Status: DC
  Administered 2016-04-15: 08:00:00 via INTRAVENOUS

## 2016-04-15 NOTE — Op Note (Signed)
[facility]enter[facility] Patient Name: Anne Parrish Procedure Date: 04/15/2016 8:11 AM MRN: [id number] Account #: [id number] Date of Birth: 02-Apr-1951 Admit Type: Outpatient Age: 65 Room: [facility] ENDO ROOM 4 Gender: Female Note Status: Finalized Procedure:            Colonoscopy Indications:          Screening for colorectal malignant neoplasm Providers:            Manya Silvas, MD Referring MD:         Jerrell Belfast, MD (Referring MD) Medicines:            Propofol per Anesthesia Complications:        No immediate complications. Procedure:            Pre-Anesthesia Assessment:                       - After reviewing the risks and benefits, the patient                        was deemed in satisfactory condition to undergo the                        procedure.                       After obtaining informed consent, the colonoscope was                        passed under direct vision. Throughout the procedure,                        the patient's blood pressure, pulse, and oxygen                        saturations were monitored continuously. The                        Colonoscope was introduced through the anus and                        advanced to the the cecum, identified by appendiceal                        orifice and ileocecal valve. The colonoscopy was                        performed without difficulty. The patient tolerated the                        procedure well. The quality of the bowel preparation                        was good. Findings:      Three sessile polyps were found in the rectum, transverse colon and       hepatic flexure. The polyps were diminutive in size. These polyps were       removed with a jumbo cold forceps. Resection and retrieval were complete.      Internal hemorrhoids were found during endoscopy. The hemorrhoids were       small and Grade I (internal hemorrhoids that do not prolapse).  The exam was otherwise  without abnormality. Impression:           - Three diminutive polyps in the rectum, in the                        transverse colon and at the hepatic flexure, removed                        with a jumbo cold forceps. Resected and retrieved.                       - Internal hemorrhoids.                       - The examination was otherwise normal. Recommendation:       - Await pathology results.                       - Resume previous diet. Manya Silvas, MD 04/15/2016 8:40:18 AM This report has been signed electronically. Number of Addenda: 0 Note Initiated On: 04/15/2016 8:11 AM Scope Withdrawal Time: 0 hours 11 minutes 24 seconds  Total Procedure Duration: 0 hours 17 minutes 33 seconds       [facility]

## 2016-04-15 NOTE — H&P (Signed)
Primary Care Physician:  Margarita Rana, MD Primary Gastroenterologist:  Dr. Vira Agar  Pre-Procedure History & Physical: HPI:  Anne Parrish is a 65 y.o. female is here for an colonoscopy.   Past Medical History:  Diagnosis Date  . Hyperlipidemia   . Hypertension     Past Surgical History:  Procedure Laterality Date  . APPENDECTOMY  1960's  . REFRACTIVE SURGERY    . TONSILLECTOMY AND ADENOIDECTOMY  1975    Prior to Admission medications   Medication Sig Start Date End Date Taking? Authorizing Provider  amLODipine (NORVASC) 5 MG tablet TAKE ONE TABLET BY MOUTH EVERY DAY 01/16/16  Yes Margarita Rana, MD  aspirin 81 MG tablet Take 1 tablet (81 mg total) by mouth daily. 10/16/15  Yes Margarita Rana, MD  atorvastatin (LIPITOR) 10 MG tablet Take 1 tablet (10 mg total) by mouth daily. 10/16/15  Yes Margarita Rana, MD  Bioflavonoid Products (BIOFLEX PO) Take by mouth.   Yes Historical Provider, MD  lisinopril-hydrochlorothiazide (PRINZIDE,ZESTORETIC) 10-12.5 MG tablet Take 1 tablet by mouth daily. 10/16/15  Yes Margarita Rana, MD  Multiple Vitamin (MULTIVITAMIN) capsule Take 1 capsule by mouth daily.   Yes Historical Provider, MD  glucose blood (ONETOUCH VERIO) test strip To check blood sugar once a day.  DX: E11.9 10/16/15   Margarita Rana, MD  omeprazole (PRILOSEC) 20 MG capsule TAKE 1 CAPSULE BY MOUTH EVERY DAY 01/16/16   Margarita Rana, MD  [facility] DELICA LANCETS FINE MISC To check blood sugars once a day.  DX:  E11.9 10/16/15   Margarita Rana, MD  Probiotic Product (PROBIOTIC DAILY PO) Take by mouth.    Historical Provider, MD    Allergies as of 04/12/2016  . (No Known Allergies)    Family History  Problem Relation Age of Onset  . Cancer Mother     lung  . Thyroid disease Mother   . Cancer Father     prostate  . Heart disease Father   . Cancer Brother     prostate    Social History   Social History  . Marital status: Divorced    Spouse name: N/A  . Number of children: N/A  . Years of  education: N/A   Occupational History  . Not on file.   Social History Main Topics  . Smoking status: Never Smoker  . Smokeless tobacco: Never Used  . Alcohol use No  . Drug use: No  . Sexual activity: Not on file   Other Topics Concern  . Not on file   Social History Narrative  . No narrative on file    Review of Systems: See HPI, otherwise negative ROS  Physical Exam: BP 132/73   Pulse 66   Temp 98.9 F (37.2 C) (Tympanic)   Resp 16   Ht 5\' 3"  (1.6 m)   Wt 73 kg (161 lb)   SpO2 96%   BMI 28.52 kg/m  General:   Alert,  pleasant and cooperative in NAD Head:  Normocephalic and atraumatic. Neck:  Supple; no masses or thyromegaly. Lungs:  Clear throughout to auscultation.    Heart:  Regular rate and rhythm. Abdomen:  Soft, nontender and nondistended. Normal bowel sounds, without guarding, and without rebound.   Neurologic:  Alert and  oriented x4;  grossly normal neurologically.  Impression/Plan: [name] is here for an colonoscopy to be performed for screening colonoscopy  Risks, benefits, limitations, and alternatives regarding  colonoscopy have been reviewed with the patient.  Questions have been answered.  All parties agreeable.   Gaylyn Cheers, MD  04/15/2016, 8:11 AM

## 2016-04-15 NOTE — Anesthesia Postprocedure Evaluation (Signed)
Anesthesia Post Note  Patient: Anne Parrish  Procedure(s) Performed: Procedure(s) (LRB): COLONOSCOPY WITH PROPOFOL (N/A)  Patient location during evaluation: Endoscopy Anesthesia Type: General Level of consciousness: awake and alert Pain management: pain level controlled Vital Signs Assessment: post-procedure vital signs reviewed and stable Respiratory status: spontaneous breathing, nonlabored ventilation, respiratory function stable and patient connected to nasal cannula oxygen Cardiovascular status: blood pressure returned to baseline and stable Postop Assessment: no signs of nausea or vomiting Anesthetic complications: no    Last Vitals:  Vitals:   04/15/16 0850 04/15/16 0900  BP: 121/61 124/68  Pulse:    Resp:    Temp:      Last Pain:  Vitals:   04/15/16 0840  TempSrc: Oral                 Martha Clan

## 2016-04-15 NOTE — Transfer of Care (Signed)
Immediate Anesthesia Transfer of Care Note  Patient: Anne Parrish  Procedure(s) Performed: Procedure(s): COLONOSCOPY WITH PROPOFOL (N/A)  Patient Location: Endoscopy Unit  Anesthesia Type:General  Level of Consciousness: awake and alert   Airway & Oxygen Therapy: Patient Spontanous Breathing and Patient connected to nasal cannula oxygen  Post-op Assessment: Report given to RN and Post -op Vital signs reviewed and stable  Post vital signs: Reviewed and stable  Last Vitals:  Vitals:   04/15/16 0747  BP: 132/73  Pulse: 66  Resp: 16  Temp: 37.2 C    Last Pain:  Vitals:   04/15/16 0747  TempSrc: Tympanic         Complications: No apparent anesthesia complications

## 2016-04-15 NOTE — Anesthesia Preprocedure Evaluation (Addendum)
Anesthesia Evaluation  Patient identified by MRN, date of birth, ID band Patient awake    Reviewed: Allergy & Precautions, NPO status , Patient's Chart, lab work & pertinent test results, reviewed documented beta blocker date and time   History of Anesthesia Complications Negative for: history of anesthetic complications  Airway Mallampati: II  TM Distance: >3 FB     Dental  (+) Chipped   Pulmonary neg pulmonary ROS,           Cardiovascular hypertension,      Neuro/Psych negative neurological ROS  negative psych ROS   GI/Hepatic Neg liver ROS, GERD  ,  Endo/Other  diabetes, Type 2  Renal/GU negative Renal ROS  negative genitourinary   Musculoskeletal   Abdominal   Peds  Hematology negative hematology ROS (+)   Anesthesia Other Findings Past Medical History: No date: Hyperlipidemia No date: Hypertension   Reproductive/Obstetrics negative OB ROS                            Anesthesia Physical Anesthesia Plan  ASA: III  Anesthesia Plan: General   Post-op Pain Management:    Induction: Intravenous  Airway Management Planned:   Additional Equipment:   Intra-op Plan:   Post-operative Plan:   Informed Consent: I have reviewed the patients History and Physical, chart, labs and discussed the procedure including the risks, benefits and alternatives for the proposed anesthesia with the patient or authorized representative who has indicated his/her understanding and acceptance.     Plan Discussed with: CRNA, Anesthesiologist and Surgeon  Anesthesia Plan Comments:        Anesthesia Quick Evaluation

## 2016-04-16 ENCOUNTER — Encounter: Payer: Self-pay | Admitting: Unknown Physician Specialty

## 2016-04-23 ENCOUNTER — Encounter: Payer: Self-pay | Admitting: Family Medicine

## 2016-04-23 DIAGNOSIS — K635 Polyp of colon: Secondary | ICD-10-CM | POA: Insufficient documentation

## 2016-08-02 ENCOUNTER — Other Ambulatory Visit: Payer: Self-pay | Admitting: Family Medicine

## 2016-08-02 DIAGNOSIS — E7800 Pure hypercholesterolemia, unspecified: Secondary | ICD-10-CM

## 2016-08-02 DIAGNOSIS — I10 Essential (primary) hypertension: Secondary | ICD-10-CM

## 2016-08-02 NOTE — Telephone Encounter (Signed)
Please review-aa 

## 2016-11-12 ENCOUNTER — Ambulatory Visit (INDEPENDENT_AMBULATORY_CARE_PROVIDER_SITE_OTHER): Payer: PPO | Admitting: Physician Assistant

## 2016-11-12 ENCOUNTER — Encounter: Payer: Self-pay | Admitting: Physician Assistant

## 2016-11-12 VITALS — BP 150/88 | HR 67 | Temp 98.1°F | Resp 16 | Ht 63.0 in | Wt 182.0 lb

## 2016-11-12 DIAGNOSIS — E7800 Pure hypercholesterolemia, unspecified: Secondary | ICD-10-CM

## 2016-11-12 DIAGNOSIS — I10 Essential (primary) hypertension: Secondary | ICD-10-CM

## 2016-11-12 DIAGNOSIS — E1165 Type 2 diabetes mellitus with hyperglycemia: Secondary | ICD-10-CM

## 2016-11-12 DIAGNOSIS — K219 Gastro-esophageal reflux disease without esophagitis: Secondary | ICD-10-CM

## 2016-11-12 DIAGNOSIS — E049 Nontoxic goiter, unspecified: Secondary | ICD-10-CM | POA: Diagnosis not present

## 2016-11-12 DIAGNOSIS — IMO0001 Reserved for inherently not codable concepts without codable children: Secondary | ICD-10-CM

## 2016-11-12 MED ORDER — AMLODIPINE BESYLATE 5 MG PO TABS: 5.0000 mg | ORAL_TABLET | Freq: Every day | ORAL | 1 refills | Status: DC

## 2016-11-12 MED ORDER — ATORVASTATIN CALCIUM 10 MG PO TABS: 10.0000 mg | ORAL_TABLET | Freq: Every day | ORAL | 1 refills | Status: DC

## 2016-11-12 MED ORDER — LISINOPRIL-HYDROCHLOROTHIAZIDE 10-12.5 MG PO TABS: 1.0000 | ORAL_TABLET | Freq: Every day | ORAL | 1 refills | Status: DC

## 2016-11-12 MED ORDER — OMEPRAZOLE 20 MG PO CPDR: DELAYED_RELEASE_CAPSULE | ORAL | 1 refills | Status: DC

## 2016-11-12 NOTE — Patient Instructions (Signed)
DASH Eating Plan DASH stands for "Dietary Approaches to Stop Hypertension." The DASH eating plan is a healthy eating plan that has been shown to reduce high blood pressure (hypertension). It may also reduce your risk for type 2 diabetes, heart disease, and stroke. The DASH eating plan may also help with weight loss. What are tips for following this plan? General guidelines  Avoid eating more than 2,300 mg (milligrams) of salt (sodium) a day. If you have hypertension, you may need to reduce your sodium intake to 1,500 mg a day.  Limit alcohol intake to no more than 1 drink a day for nonpregnant women and 2 drinks a day for men. One drink equals 12 oz of beer, 5 oz of wine, or 1 oz of hard liquor.  Work with your health care provider to maintain a healthy body weight or to lose weight. Ask what an ideal weight is for you.  Get at least 30 minutes of exercise that causes your heart to beat faster (aerobic exercise) most days of the week. Activities may include walking, swimming, or biking.  Work with your health care provider or diet and nutrition specialist (dietitian) to adjust your eating plan to your individual calorie needs. Reading food labels  Check food labels for the amount of sodium per serving. Choose foods with less than 5 percent of the Daily Value of sodium. Generally, foods with less than 300 mg of sodium per serving fit into this eating plan.  To find whole grains, look for the word "whole" as the first word in the ingredient list. Shopping  Buy products labeled as "low-sodium" or "no salt added."  Buy fresh foods. Avoid canned foods and premade or frozen meals. Cooking  Avoid adding salt when cooking. Use salt-free seasonings or herbs instead of table salt or sea salt. Check with your health care provider or pharmacist before using salt substitutes.  Do not fry foods. Cook foods using healthy methods such as baking, boiling, grilling, and broiling instead.  Cook with  heart-healthy oils, such as olive, canola, soybean, or sunflower oil. Meal planning   Eat a balanced diet that includes: ? 5 or more servings of fruits and vegetables each day. At each meal, try to fill half of your plate with fruits and vegetables. ? Up to 6-8 servings of whole grains each day. ? Less than 6 oz of lean meat, poultry, or fish each day. A 3-oz serving of meat is about the same size as a deck of cards. One egg equals 1 oz. ? 2 servings of low-fat dairy each day. ? A serving of nuts, seeds, or beans 5 times each week. ? Heart-healthy fats. Healthy fats called Omega-3 fatty acids are found in foods such as flaxseeds and coldwater fish, like sardines, salmon, and mackerel.  Limit how much you eat of the following: ? Canned or prepackaged foods. ? Food that is high in trans fat, such as fried foods. ? Food that is high in saturated fat, such as fatty meat. ? Sweets, desserts, sugary drinks, and other foods with added sugar. ? Full-fat dairy products.  Do not salt foods before eating.  Try to eat at least 2 vegetarian meals each week.  Eat more home-cooked food and less restaurant, buffet, and fast food.  When eating at a restaurant, ask that your food be prepared with less salt or no salt, if possible. What foods are recommended? The items listed may not be a complete list. Talk with your dietitian about what   dietary choices are best for you. Grains Whole-grain or whole-wheat bread. Whole-grain or whole-wheat pasta. Brown rice. Oatmeal. Quinoa. Bulgur. Whole-grain and low-sodium cereals. Pita bread. Low-fat, low-sodium crackers. Whole-wheat flour tortillas. Vegetables Fresh or frozen vegetables (raw, steamed, roasted, or grilled). Low-sodium or reduced-sodium tomato and vegetable juice. Low-sodium or reduced-sodium tomato sauce and tomato paste. Low-sodium or reduced-sodium canned vegetables. Fruits All fresh, dried, or frozen fruit. Canned fruit in natural juice (without  added sugar). Meat and other protein foods Skinless chicken or turkey. Ground chicken or turkey. Pork with fat trimmed off. Fish and seafood. Egg whites. Dried beans, peas, or lentils. Unsalted nuts, nut butters, and seeds. Unsalted canned beans. Lean cuts of beef with fat trimmed off. Low-sodium, lean deli meat. Dairy Low-fat (1%) or fat-free (skim) milk. Fat-free, low-fat, or reduced-fat cheeses. Nonfat, low-sodium ricotta or cottage cheese. Low-fat or nonfat yogurt. Low-fat, low-sodium cheese. Fats and oils Soft margarine without trans fats. Vegetable oil. Low-fat, reduced-fat, or light mayonnaise and salad dressings (reduced-sodium). Canola, safflower, olive, soybean, and sunflower oils. Avocado. Seasoning and other foods Herbs. Spices. Seasoning mixes without salt. Unsalted popcorn and pretzels. Fat-free sweets. What foods are not recommended? The items listed may not be a complete list. Talk with your dietitian about what dietary choices are best for you. Grains Baked goods made with fat, such as croissants, muffins, or some breads. Dry pasta or rice meal packs. Vegetables Creamed or fried vegetables. Vegetables in a cheese sauce. Regular canned vegetables (not low-sodium or reduced-sodium). Regular canned tomato sauce and paste (not low-sodium or reduced-sodium). Regular tomato and vegetable juice (not low-sodium or reduced-sodium). Pickles. Olives. Fruits Canned fruit in a light or heavy syrup. Fried fruit. Fruit in cream or butter sauce. Meat and other protein foods Fatty cuts of meat. Ribs. Fried meat. Bacon. Sausage. Bologna and other processed lunch meats. Salami. Fatback. Hotdogs. Bratwurst. Salted nuts and seeds. Canned beans with added salt. Canned or smoked fish. Whole eggs or egg yolks. Chicken or turkey with skin. Dairy Whole or 2% milk, cream, and half-and-half. Whole or full-fat cream cheese. Whole-fat or sweetened yogurt. Full-fat cheese. Nondairy creamers. Whipped toppings.  Processed cheese and cheese spreads. Fats and oils Butter. Stick margarine. Lard. Shortening. Ghee. Bacon fat. Tropical oils, such as coconut, palm kernel, or palm oil. Seasoning and other foods Salted popcorn and pretzels. Onion salt, garlic salt, seasoned salt, table salt, and sea salt. Worcestershire sauce. Tartar sauce. Barbecue sauce. Teriyaki sauce. Soy sauce, including reduced-sodium. Steak sauce. Canned and packaged gravies. Fish sauce. Oyster sauce. Cocktail sauce. Horseradish that you find on the shelf. Ketchup. Mustard. Meat flavorings and tenderizers. Bouillon cubes. Hot sauce and Tabasco sauce. Premade or packaged marinades. Premade or packaged taco seasonings. Relishes. Regular salad dressings. Where to find more information:  National Heart, Lung, and Blood Institute: www.nhlbi.nih.gov  American Heart Association: www.heart.org Summary  The DASH eating plan is a healthy eating plan that has been shown to reduce high blood pressure (hypertension). It may also reduce your risk for type 2 diabetes, heart disease, and stroke.  With the DASH eating plan, you should limit salt (sodium) intake to 2,300 mg a day. If you have hypertension, you may need to reduce your sodium intake to 1,500 mg a day.  When on the DASH eating plan, aim to eat more fresh fruits and vegetables, whole grains, lean proteins, low-fat dairy, and heart-healthy fats.  Work with your health care provider or diet and nutrition specialist (dietitian) to adjust your eating plan to your individual   calorie needs. This information is not intended to replace advice given to you by your health care provider. Make sure you discuss any questions you have with your health care provider. Document Released: 08/19/2011 Document Revised: 08/23/2016 Document Reviewed: 08/23/2016 Elsevier Interactive Patient Education  2017 Elsevier Inc.  

## 2016-11-12 NOTE — Progress Notes (Signed)
Patient: Anne Parrish Female    DOB: Feb 04, 1951   66 y.o.   MRN: [id number] Visit Date: 11/12/2016  Today's Provider: Mar Daring, PA-C   Chief Complaint  Patient presents with  . Follow-up    Diabetes, Cholesterol, Hypertension   Subjective:    HPI  Hypertension, follow-up:  BP Readings from Last 3 Encounters:  11/12/16 (!) 150/88  04/15/16 (!) 112/97  03/23/16 (!) 156/82    She was last seen for hypertension 1 years ago.  BP at that visit was 156/82. Management since that visit includes D/C HCTZ and Potassium. Continue Amlodipine and Lisinopril-Hydrochlorothiazide was added. Per patient she it has been a week or so of not taking medication. She reports fair compliance with treatment. She is not having side effects.  She is exercising. She walks. She is adherent to low salt diet.   Outside blood pressures are n/a. She is experiencing none.  Patient denies chest pain.   Cardiovascular risk factors include advanced age (older than 51 for men, 38 for women), diabetes mellitus, dyslipidemia and hypertension.    Weight trend: increasing rapidly Wt Readings from Last 3 Encounters:  11/12/16 182 lb (82.6 kg)  04/15/16 161 lb (73 kg)  03/23/16 160 lb (72.6 kg)    Current diet: in general, an "unhealthy" diet  ------------------------------------------------------------------------    Lipid/Cholesterol, Follow-up:   Last seen for this1 years ago.  Management changes since that visit include Atorvastatin 10 MG and was advised stop if any cramps or muscle symptoms. . Last Lipid Panel:    Component Value Date/Time   CHOL 245 (H) 10/08/2015 0804   TRIG 152 (H) 10/08/2015 0804   HDL 61 10/08/2015 0804   CHOLHDL 4.0 10/08/2015 0804   LDLCALC 154 (H) 10/08/2015 0804    Risk factors for vascular disease include diabetes mellitus, hypercholesterolemia and hypertension  She reports excellent compliance with treatment.Per patient she it has been a week or  so of not taking medication. She is not having side effects.  Current symptoms include none and have been stable. Weight trend: increasing steadily Prior visit with dietician: noPatient was referred to [place].    Diabetes Mellitus Type II, Follow-up:   Lab Results  Component Value Date   HGBA1C 8.8 (H) 10/08/2015    Last seen for diabetes 1 years ago.  Management since then includes it was a new problem. Pt was taught how to check FSBG. Pt was referred to [facility] and was advised to call  if not reaching goal of 120 to 140 in am with lifestyle to start medication.  Current symptoms include none and have been stable. Home blood sugar records: n/a   Most Recent Eye Exam: 02/2016 Prior visit with dietician: no.Ptient didn't go to [facility].   Pertinent Labs:    Component Value Date/Time   CHOL 245 (H) 10/08/2015 0804   TRIG 152 (H) 10/08/2015 0804   HDL 61 10/08/2015 0804   LDLCALC 154 (H) 10/08/2015 0804   CREATININE 0.83 03/23/2016 2309    ------------------------------------------------------------------------     Allergies  Allergen Reactions  . Adhesive [Tape]   . Morphine And Related Itching  . Zolpidem   . Benzonatate Rash  . Levofloxacin Rash     Current Outpatient Prescriptions:  .  aspirin 81 MG tablet, Take 1 tablet (81 mg total) by mouth daily., Disp: 1 tablet, Rfl: 0 .  Bioflavonoid Products (BIOFLEX PO), Take by mouth., Disp: , Rfl:  .  Multiple Vitamin (MULTIVITAMIN) capsule, Take 1 capsule by mouth daily., Disp: , Rfl:  .  omeprazole (PRILOSEC) 20 MG capsule, TAKE 1 CAPSULE BY MOUTH EVERY DAY, Disp: 90 capsule, Rfl: 1 .  Probiotic Product (PROBIOTIC DAILY PO), Take by mouth., Disp: , Rfl:  .  amLODipine (NORVASC) 5 MG tablet, TAKE ONE TABLET BY MOUTH EVERY DAY (Patient not taking: Reported on 11/12/2016), Disp: 90 tablet, Rfl: 1 .  atorvastatin (LIPITOR) 10 MG tablet, Take 1 tablet (10 mg total) by mouth daily. (Patient  not taking: Reported on 11/12/2016), Disp: 90 tablet, Rfl: 3 .  glucose blood (ONETOUCH VERIO) test strip, To check blood sugar once a day.  DX: E11.9 (Patient not taking: Reported on 11/12/2016), Disp: 100 each, Rfl: 3 .  lisinopril-hydrochlorothiazide (PRINZIDE,ZESTORETIC) 10-12.5 MG tablet, Take 1 tablet by mouth daily. (Patient not taking: Reported on 11/12/2016), Disp: 90 tablet, Rfl: 3 .  ONETOUCH DELICA LANCETS FINE MISC, To check blood sugars once a day.  DX:  E11.9 (Patient not taking: Reported on 11/12/2016), Disp: 100 each, Rfl: 1  Review of Systems  Constitutional: Negative for fatigue.  Respiratory: Negative for cough, chest tightness and shortness of breath.   Cardiovascular: Negative for chest pain, palpitations and leg swelling.  Gastrointestinal: Negative for constipation, diarrhea, nausea and vomiting.  Endocrine: Negative for polydipsia and polyuria.  Neurological: Negative for dizziness, weakness, light-headedness and headaches.    Social History  Substance Use Topics  . Smoking status: Never Smoker  . Smokeless tobacco: Never Used  . Alcohol use No   Objective:   BP (!) 150/88 (BP Location: Right Arm, Patient Position: Sitting, Cuff Size: Normal)   Pulse 67   Temp 98.1 F (36.7 C) (Oral)   Resp 16   Ht 5\' 3"  (1.6 m)   Wt 182 lb (82.6 kg)   BMI 32.24 kg/m     Physical Exam  Constitutional: She appears well-developed and well-nourished. No distress.  Neck: Normal range of motion. Neck supple.  Cardiovascular: Normal rate, regular rhythm and normal heart sounds.  Exam reveals no gallop and no friction rub.   No murmur heard. Pulmonary/Chest: Effort normal and breath sounds normal. No respiratory distress. She has no wheezes. She has no rales.  Musculoskeletal: She exhibits no edema.  Skin: She is not diaphoretic.  Vitals reviewed.      Assessment & Plan:     1. Essential hypertension Stable. Diagnosis pulled for medication refill. Continue current medical  treatment plan. Will check labs as below and f/u pending results. Patient will return in 6-7 weeks for CPE/AWV. - CBC with Differential/Platelet - Comprehensive metabolic panel - lisinopril-hydrochlorothiazide (PRINZIDE,ZESTORETIC) 10-12.5 MG tablet; Take 1 tablet by mouth daily.  Dispense: 90 tablet; Refill: 1 - amLODipine (NORVASC) 5 MG tablet; Take 1 tablet (5 mg total) by mouth daily.  Dispense: 90 tablet; Refill: 1  2. Gastroesophageal reflux disease without esophagitis Stable. Diagnosis pulled for medication refill. Continue current medical treatment plan. - omeprazole (PRILOSEC) 20 MG capsule; TAKE 1 CAPSULE BY MOUTH EVERY DAY  Dispense: 90 capsule; Refill: 1  3. Uncontrolled type 2 diabetes mellitus without complication, without long-term current use of insulin (HCC) Previously 8.8. Had been referred to Nutrition counseling and diabetic educator but patient never went. Had previously refused medications. Will check labs as below and f/u pending results. - Comprehensive metabolic panel - Hemoglobin A1c  4. Hypercholesteremia Stable. Diagnosis pulled for medication refill. Continue current medical treatment plan. Will check labs as below and f/u pending results. -  Comprehensive metabolic panel - Lipid panel - atorvastatin (LIPITOR) 10 MG tablet; Take 1 tablet (10 mg total) by mouth daily.  Dispense: 90 tablet; Refill: 1  5. Goiter Will check labs as below and f/u pending results. - TSH       Mar Daring, PA-C  [facility]

## 2016-11-13 LAB — CBC WITH DIFFERENTIAL/PLATELET
BASOS: 0 %
Basophils Absolute: 0 10*3/uL (ref 0.0–0.2)
EOS (ABSOLUTE): 0.2 10*3/uL (ref 0.0–0.4)
EOS: 4 %
HEMATOCRIT: 36.9 % (ref 34.0–46.6)
HEMOGLOBIN: 12.9 g/dL (ref 11.1–15.9)
IMMATURE GRANS (ABS): 0 10*3/uL (ref 0.0–0.1)
IMMATURE GRANULOCYTES: 0 %
LYMPHS: 30 %
Lymphocytes Absolute: 1.6 10*3/uL (ref 0.7–3.1)
MCH: 29.4 pg (ref 26.6–33.0)
MCHC: 35 g/dL (ref 31.5–35.7)
MCV: 84 fL (ref 79–97)
Monocytes Absolute: 0.5 10*3/uL (ref 0.1–0.9)
Monocytes: 9 %
NEUTROS ABS: 3 10*3/uL (ref 1.4–7.0)
NEUTROS PCT: 57 %
Platelets: 296 10*3/uL (ref 150–379)
RBC: 4.39 x10E6/uL (ref 3.77–5.28)
RDW: 13.1 % (ref 12.3–15.4)
WBC: 5.3 10*3/uL (ref 3.4–10.8)

## 2016-11-13 LAB — COMPREHENSIVE METABOLIC PANEL
A/G RATIO: 1.7 (ref 1.2–2.2)
ALBUMIN: 4.4 g/dL (ref 3.6–4.8)
ALT: 28 IU/L (ref 0–32)
AST: 32 IU/L (ref 0–40)
Alkaline Phosphatase: 70 IU/L (ref 39–117)
BILIRUBIN TOTAL: 0.7 mg/dL (ref 0.0–1.2)
BUN / CREAT RATIO: 12 (ref 12–28)
BUN: 11 mg/dL (ref 8–27)
CALCIUM: 9.4 mg/dL (ref 8.7–10.3)
CHLORIDE: 102 mmol/L (ref 96–106)
CO2: 22 mmol/L (ref 18–29)
Creatinine, Ser: 0.9 mg/dL (ref 0.57–1.00)
GFR, EST AFRICAN AMERICAN: 77 mL/min/{1.73_m2} (ref >59)
GFR, EST NON AFRICAN AMERICAN: 67 mL/min/{1.73_m2} (ref >59)
GLOBULIN, TOTAL: 2.6 g/dL (ref 1.5–4.5)
Glucose: 110 mg/dL — ABNORMAL HIGH (ref 65–99)
POTASSIUM: 4.5 mmol/L (ref 3.5–5.2)
SODIUM: 142 mmol/L (ref 134–144)
TOTAL PROTEIN: 7 g/dL (ref 6.0–8.5)

## 2016-11-13 LAB — LIPID PANEL
CHOL/HDL RATIO: 2.3 ratio (ref 0.0–4.4)
CHOLESTEROL TOTAL: 170 mg/dL (ref 100–199)
HDL: 74 mg/dL (ref >39)
LDL CALC: 84 mg/dL (ref 0–99)
Triglycerides: 60 mg/dL (ref 0–149)
VLDL CHOLESTEROL CAL: 12 mg/dL (ref 5–40)

## 2016-11-13 LAB — HEMOGLOBIN A1C
Est. average glucose Bld gHb Est-mCnc: 134 mg/dL
Hgb A1c MFr Bld: 6.3 % — ABNORMAL HIGH (ref 4.8–5.6)

## 2016-11-13 LAB — TSH: TSH: 1.9 u[IU]/mL (ref 0.450–4.500)

## 2016-11-15 ENCOUNTER — Telehealth: Payer: Self-pay

## 2016-11-15 NOTE — Telephone Encounter (Signed)
LMTCB  Thanks,  -Joseline 

## 2016-11-15 NOTE — Telephone Encounter (Signed)
Pt is returning call. CB#[phone]/MW

## 2016-11-15 NOTE — Telephone Encounter (Signed)
-----   Message from Mar Daring, [place] sent at 11/15/2016 10:52 AM EST ----- Cholesterol is drastically improved from previous labs and WNL now. A1c also improved from 8.8 to 6.3! All other labs are WNL and stable. Continue current medications and lifestyle changes.

## 2016-11-15 NOTE — Telephone Encounter (Signed)
Patient advised as directed below.  Thanks,  -Miracle Criado 

## 2016-11-15 NOTE — Telephone Encounter (Signed)
LMTCB  Thanks,  -Thomos Domine 

## 2016-12-06 DIAGNOSIS — L821 Other seborrheic keratosis: Secondary | ICD-10-CM | POA: Diagnosis not present

## 2016-12-06 DIAGNOSIS — L820 Inflamed seborrheic keratosis: Secondary | ICD-10-CM | POA: Diagnosis not present

## 2016-12-06 DIAGNOSIS — L578 Other skin changes due to chronic exposure to nonionizing radiation: Secondary | ICD-10-CM | POA: Diagnosis not present

## 2016-12-06 DIAGNOSIS — L570 Actinic keratosis: Secondary | ICD-10-CM | POA: Diagnosis not present

## 2016-12-06 DIAGNOSIS — L812 Freckles: Secondary | ICD-10-CM | POA: Diagnosis not present

## 2016-12-30 ENCOUNTER — Ambulatory Visit (INDEPENDENT_AMBULATORY_CARE_PROVIDER_SITE_OTHER): Payer: PPO | Admitting: Physician Assistant

## 2016-12-30 ENCOUNTER — Encounter: Payer: Self-pay | Admitting: Physician Assistant

## 2016-12-30 VITALS — BP 120/80 | HR 75 | Temp 98.7°F | Resp 16 | Ht 63.0 in | Wt 167.4 lb

## 2016-12-30 DIAGNOSIS — Z1239 Encounter for other screening for malignant neoplasm of breast: Secondary | ICD-10-CM

## 2016-12-30 DIAGNOSIS — Z0000 Encounter for general adult medical examination without abnormal findings: Secondary | ICD-10-CM

## 2016-12-30 DIAGNOSIS — Z1231 Encounter for screening mammogram for malignant neoplasm of breast: Secondary | ICD-10-CM

## 2016-12-30 DIAGNOSIS — N3941 Urge incontinence: Secondary | ICD-10-CM

## 2016-12-30 DIAGNOSIS — Z23 Encounter for immunization: Secondary | ICD-10-CM

## 2016-12-30 NOTE — Progress Notes (Signed)
Patient: Anne Parrish Female    DOB: 1950-11-08   66 y.o.   MRN: [id number] Visit Date: 12/30/2016  Today's Provider: Mar Daring, PA-C   Chief Complaint  Patient presents with  . Medicare Wellness   Subjective:     . Annual wellness visit Anne Parrish is a 66 y.o. female. She feels well. She reports exercising none, but is very active. She reports she is sleeping well.    12/30/15 AWE  03/13/12 Pap-neg; HPV-neg  01/19/16 BMD-borderline osteoporosis  09/30/2015 Mammo: [facility]ads:1 right breast  04/15/2016 Colonoscopy: Dr. Vira Agar; 5 year 2 tubular  adenomas and 1 hyperplastic polyp; internal hemorrhoids.    --------------------------------------------------------   HPI     Allergies  Allergen Reactions  . Adhesive [Tape]   . Morphine And Related Itching  . Zolpidem   . Benzonatate Rash  . Levofloxacin Rash     Current Outpatient Prescriptions:  .  amLODipine (NORVASC) 5 MG tablet, Take 1 tablet (5 mg total) by mouth daily., Disp: 90 tablet, Rfl: 1 .  aspirin 81 MG tablet, Take 1 tablet (81 mg total) by mouth daily., Disp: 1 tablet, Rfl: 0 .  atorvastatin (LIPITOR) 10 MG tablet, Take 1 tablet (10 mg total) by mouth daily., Disp: 90 tablet, Rfl: 1 .  Bioflavonoid Products (BIOFLEX PO), Take by mouth., Disp: , Rfl:  .  glucose blood (ONETOUCH VERIO) test strip, To check blood sugar once a day.  DX: E11.9, Disp: 100 each, Rfl: 3 .  lisinopril-hydrochlorothiazide (PRINZIDE,ZESTORETIC) 10-12.5 MG tablet, Take 1 tablet by mouth daily., Disp: 90 tablet, Rfl: 1 .  Multiple Vitamin (MULTIVITAMIN) capsule, Take 1 capsule by mouth daily., Disp: , Rfl:  .  omeprazole (PRILOSEC) 20 MG capsule, TAKE 1 CAPSULE BY MOUTH EVERY DAY, Disp: 90 capsule, Rfl: 1 .  ONETOUCH DELICA LANCETS FINE MISC, To check blood sugars once a day.  DX:  E11.9, Disp: 100 each, Rfl: 1 .  Probiotic Product (PROBIOTIC DAILY PO), Take by mouth., Disp: , Rfl:   Review of Systems  Constitutional:  Negative.   HENT: Positive for tinnitus.   Eyes: Negative.   Respiratory: Negative.   Cardiovascular: Negative.   Gastrointestinal: Negative.   Endocrine: Negative.   Genitourinary: Positive for urgency (She reports that for the last month she had some accidents).  Musculoskeletal: Negative.   Skin: Negative.   Allergic/Immunologic: Negative.   Neurological: Negative.   Hematological: Negative.   Psychiatric/Behavioral: Negative.     Social History  Substance Use Topics  . Smoking status: Never Smoker  . Smokeless tobacco: Never Used  . Alcohol use No   Active Ambulatory Problems    Diagnosis Date Noted  . Allergic rhinitis 07/18/2015  . Goiter 07/18/2015  . BP (high blood pressure) 07/18/2015  . Hypercholesteremia 07/18/2015  . GERD (gastroesophageal reflux disease) 08/13/2015  . Diabetes type 2, uncontrolled ([place]) 10/16/2015  . Colon polyp 04/23/2016   Resolved Ambulatory Problems    Diagnosis Date Noted  . Diabetes ([place]) 07/18/2015  . Other specified erythematous conditions 11/08/2006  . Derangement of knee 01/31/2014  . Abnormal blood sugar 10/09/2015   Past Medical History:  Diagnosis Date  . Hyperlipidemia   . Hypertension      Objective:   BP 120/80 (BP Location: Right Arm, Patient Position: Sitting, Cuff Size: Normal)   Pulse 75   Temp 98.7 F (37.1 C) (Oral)   Resp 16   Ht 5\' 3"  (1.6 m)   Wt 167  lb 6.4 oz (75.9 kg)   BMI 29.65 kg/m     Physical Exam  Constitutional: She is oriented to person, place, and time. She appears well-developed and well-nourished. No distress.  HENT:  Head: Normocephalic and atraumatic.  Right Ear: Hearing, tympanic membrane, external ear and ear canal normal.  Left Ear: Hearing, tympanic membrane, external ear and ear canal normal.  Nose: Nose normal.  Mouth/Throat: Uvula is midline, oropharynx is clear and moist and mucous membranes are normal. No oropharyngeal exudate.  Eyes: Conjunctivae and EOM are normal.  Pupils are equal, round, and reactive to light. Right eye exhibits no discharge. Left eye exhibits no discharge. No scleral icterus.  Neck: Normal range of motion. Neck supple. No JVD present. No tracheal deviation present. No thyromegaly present.  Cardiovascular: Normal rate, regular rhythm, normal heart sounds and intact distal pulses.  Exam reveals no gallop and no friction rub.   No murmur heard. Pulmonary/Chest: Effort normal and breath sounds normal. No respiratory distress. She has no wheezes. She has no rales. She exhibits no tenderness.  Abdominal: Soft. Bowel sounds are normal. She exhibits no distension and no mass. There is no tenderness. There is no rebound and no guarding.  Musculoskeletal: Normal range of motion. She exhibits no edema or tenderness.  Lymphadenopathy:    She has no cervical adenopathy.  Neurological: She is alert and oriented to person, place, and time.  Skin: Skin is warm and dry. No rash noted. She is not diaphoretic.  Psychiatric: She has a normal mood and affect. Her behavior is normal. Judgment and thought content normal.  Vitals reviewed.   Functional Status Survey: Is the patient deaf or have difficulty hearing?: No Does the patient have difficulty seeing, even when wearing glasses/contacts?: No Does the patient have difficulty concentrating, remembering, or making decisions?: No Does the patient have difficulty walking or climbing stairs?: No Does the patient have difficulty dressing or bathing?: No Does the patient have difficulty doing errands alone such as visiting a doctor's office or shopping?: No  Fall Risk  12/30/2016 12/30/2015 10/16/2015  Falls in the past year? No No No   Depression screen [facility] 2/9 12/30/2016 12/30/2015 10/16/2015  Decreased Interest 0 0 0  Down, Depressed, Hopeless 0 0 0  PHQ - 2 Score 0 0 0  Altered sleeping 0 - -  Tired, decreased energy 0 - -  Change in appetite 0 - -  Feeling bad or failure about yourself  0 - -  Trouble  concentrating 0 - -  Moving slowly or fidgety/restless 0 - -  Suicidal thoughts 0 - -  PHQ-9 Score 0 - -  Difficult doing work/chores Not difficult at all - -   Cognitive Testing - 6-CIT  Correct? Score   What year is it? yes 0 0 or 4  What month is it? yes 0 0 or 3  Memorize:    [name],  41,  [place],      What time is it? (within 1 hour) yes 0 0 or 3  Count backwards from 20 yes 0 0, 2, or 4  Name the months of the year yes 0 0, 2, or 4  Repeat name & address above yes 0 0, 2, 4, 6, 8, or 10       TOTAL SCORE  0/28   Interpretation:  Normal  Normal (0-7) Abnormal (8-28)    Audit-C Alcohol Use Screening  Question Answer Points  How often do you have  alcoholic drink? never 0  On days you do drink alcohol, how many drinks do you typically consume? 0 0  How oftey will you drink 6 or more in a total? never 0  Total Score:  0   A score of 3 or more in women, and 4 or more in men indicates increased risk for alcohol abuse, EXCEPT if all of the points are from question 1.       Assessment & Plan:     1. Medicare annual wellness visit, subsequent Normal physical exam for age.  2. Breast cancer screening Breast exam today was normal. There is no family history of breast cancer. She does perform regular self breast exams. Mammogram was ordered as below. Information for [facility] was given to patient so she may schedule her mammogram at her convenience. - MM Digital Screening; Future  3. Urge incontinence Discussed timed toileting, kegel exercises. Discussed referral for pelvic floor therapy if her doing it on her own does not improve symptoms. Discussed medications for OAB but do not want to start unless fails conservative measures.   4. Need for pneumococcal vaccination Vaccine given to patient without complications. Patient sat for 15 minutes after administration and was tolerated well without adverse effects. - Pneumococcal polysaccharide  vaccine 23-valent greater than or equal to 2yo subcutaneous/IM       Mar Daring, PA-C  [place]

## 2016-12-30 NOTE — Patient Instructions (Signed)
Health Maintenance for Postmenopausal Women Menopause is a normal process in which your reproductive ability comes to an end. This process happens gradually over a span of months to years, usually between the ages of 33 and 38. Menopause is complete when you have missed 12 consecutive menstrual periods. It is important to talk with your health care provider about some of the most common conditions that affect postmenopausal women, such as heart disease, cancer, and bone loss (osteoporosis). Adopting a healthy lifestyle and getting preventive care can help to promote your health and wellness. Those actions can also lower your chances of developing some of these common conditions. What should I know about menopause? During menopause, you may experience a number of symptoms, such as:  Moderate-to-severe hot flashes.  Night sweats.  Decrease in sex drive.  Mood swings.  Headaches.  Tiredness.  Irritability.  Memory problems.  Insomnia. Choosing to treat or not to treat menopausal changes is an individual decision that you make with your health care provider. What should I know about hormone replacement therapy and supplements? Hormone therapy products are effective for treating symptoms that are associated with menopause, such as hot flashes and night sweats. Hormone replacement carries certain risks, especially as you become older. If you are thinking about using estrogen or estrogen with progestin treatments, discuss the benefits and risks with your health care provider. What should I know about heart disease and stroke? Heart disease, heart attack, and stroke become more likely as you age. This may be due, in part, to the hormonal changes that your body experiences during menopause. These can affect how your body processes dietary fats, triglycerides, and cholesterol. Heart attack and stroke are both medical emergencies. There are many things that you can do to help prevent heart disease  and stroke:  Have your blood pressure checked at least every 1-2 years. High blood pressure causes heart disease and increases the risk of stroke.  If you are 67-67 years old, ask your health care provider if you should take aspirin to prevent a heart attack or a stroke.  Do not use any tobacco products, including cigarettes, chewing tobacco, or electronic cigarettes. If you need help quitting, ask your health care provider.  It is important to eat a healthy diet and maintain a healthy weight.  Be sure to include plenty of vegetables, fruits, low-fat dairy products, and lean protein.  Avoid eating foods that are high in solid fats, added sugars, or salt (sodium).  Get regular exercise. This is one of the most important things that you can do for your health.  Try to exercise for at least 150 minutes each week. The type of exercise that you do should increase your heart rate and make you sweat. This is known as moderate-intensity exercise.  Try to do strengthening exercises at least twice each week. Do these in addition to the moderate-intensity exercise.  Know your numbers.Ask your health care provider to check your cholesterol and your blood glucose. Continue to have your blood tested as directed by your health care provider. What should I know about cancer screening? There are several types of cancer. Take the following steps to reduce your risk and to catch any cancer development as early as possible. Breast Cancer  Practice breast self-awareness.  This means understanding how your breasts normally appear and feel.  It also means doing regular breast self-exams. Let your health care provider know about any changes, no matter how small.  If you are 40 or older,  have a clinician do a breast exam (clinical breast exam or CBE) every year. Depending on your age, family history, and medical history, it may be recommended that you also have a yearly breast X-ray (mammogram).  If you  have a family history of breast cancer, talk with your health care provider about genetic screening.  If you are at high risk for breast cancer, talk with your health care provider about having an MRI and a mammogram every year.  Breast cancer (BRCA) gene test is recommended for women who have family members with BRCA-related cancers. Results of the assessment will determine the need for genetic counseling and BRCA1 and for BRCA2 testing. BRCA-related cancers include these types:  Breast. This occurs in males or females.  Ovarian.  Tubal. This may also be called fallopian tube cancer.  Cancer of the abdominal or pelvic lining (peritoneal cancer).  Prostate.  Pancreatic. Cervical, Uterine, and Ovarian Cancer  Your health care provider may recommend that you be screened regularly for cancer of the pelvic organs. These include your ovaries, uterus, and vagina. This screening involves a pelvic exam, which includes checking for microscopic changes to the surface of your cervix (Pap test).  For women ages 21-65, health care providers may recommend a pelvic exam and a Pap test every three years. For women ages 23-65, they may recommend the Pap test and pelvic exam, combined with testing for human papilloma virus (HPV), every five years. Some types of HPV increase your risk of cervical cancer. Testing for HPV may also be done on women of any age who have unclear Pap test results.  Other health care providers may not recommend any screening for nonpregnant women who are considered low risk for pelvic cancer and have no symptoms. Ask your health care provider if a screening pelvic exam is right for you.  If you have had past treatment for cervical cancer or a condition that could lead to cancer, you need Pap tests and screening for cancer for at least 20 years after your treatment. If Pap tests have been discontinued for you, your risk factors (such as having a new sexual partner) need to be reassessed  to determine if you should start having screenings again. Some women have medical problems that increase the chance of getting cervical cancer. In these cases, your health care provider may recommend that you have screening and Pap tests more often.  If you have a family history of uterine cancer or ovarian cancer, talk with your health care provider about genetic screening.  If you have vaginal bleeding after reaching menopause, tell your health care provider.  There are currently no reliable tests available to screen for ovarian cancer. Lung Cancer  Lung cancer screening is recommended for adults 99-83 years old who are at high risk for lung cancer because of a history of smoking. A yearly low-dose CT scan of the lungs is recommended if you:  Currently smoke.  Have a history of at least 30 pack-years of smoking and you currently smoke or have quit within the past 15 years. A pack-year is smoking an average of one pack of cigarettes per day for one year. Yearly screening should:  Continue until it has been 15 years since you quit.  Stop if you develop a health problem that would prevent you from having lung cancer treatment. Colorectal Cancer  This type of cancer can be detected and can often be prevented.  Routine colorectal cancer screening usually begins at age 72 and continues  through age 75.  If you have risk factors for colon cancer, your health care provider may recommend that you be screened at an earlier age.  If you have a family history of colorectal cancer, talk with your health care provider about genetic screening.  Your health care provider may also recommend using home test kits to check for hidden blood in your stool.  A small camera at the end of a tube can be used to examine your colon directly (sigmoidoscopy or colonoscopy). This is done to check for the earliest forms of colorectal cancer.  Direct examination of the colon should be repeated every 5-10 years until  age 75. However, if early forms of precancerous polyps or small growths are found or if you have a family history or genetic risk for colorectal cancer, you may need to be screened more often. Skin Cancer  Check your skin from head to toe regularly.  Monitor any moles. Be sure to tell your health care provider:  About any new moles or changes in moles, especially if there is a change in a mole's shape or color.  If you have a mole that is larger than the size of a pencil eraser.  If any of your family members has a history of skin cancer, especially at a young age, talk with your health care provider about genetic screening.  Always use sunscreen. Apply sunscreen liberally and repeatedly throughout the day.  Whenever you are outside, protect yourself by wearing long sleeves, pants, a wide-brimmed hat, and sunglasses. What should I know about osteoporosis? Osteoporosis is a condition in which bone destruction happens more quickly than new bone creation. After menopause, you may be at an increased risk for osteoporosis. To help prevent osteoporosis or the bone fractures that can happen because of osteoporosis, the following is recommended:  If you are 19-50 years old, get at least 1,000 mg of calcium and at least 600 mg of vitamin D per day.  If you are older than age 50 but younger than age 70, get at least 1,200 mg of calcium and at least 600 mg of vitamin D per day.  If you are older than age 70, get at least 1,200 mg of calcium and at least 800 mg of vitamin D per day. Smoking and excessive alcohol intake increase the risk of osteoporosis. Eat foods that are rich in calcium and vitamin D, and do weight-bearing exercises several times each week as directed by your health care provider. What should I know about how menopause affects my mental health? Depression may occur at any age, but it is more common as you become older. Common symptoms of depression include:  Low or sad  mood.  Changes in sleep patterns.  Changes in appetite or eating patterns.  Feeling an overall lack of motivation or enjoyment of activities that you previously enjoyed.  Frequent crying spells. Talk with your health care provider if you think that you are experiencing depression. What should I know about immunizations? It is important that you get and maintain your immunizations. These include:  Tetanus, diphtheria, and pertussis (Tdap) booster vaccine.  Influenza every year before the flu season begins.  Pneumonia vaccine.  Shingles vaccine. Your health care provider may also recommend other immunizations. This information is not intended to replace advice given to you by your health care provider. Make sure you discuss any questions you have with your health care provider. Document Released: 10/22/2005 Document Revised: 03/19/2016 Document Reviewed: 06/03/2015 Elsevier Interactive Patient   Education  2017 [employer]  

## 2017-02-09 ENCOUNTER — Other Ambulatory Visit: Payer: Self-pay | Admitting: Physician Assistant

## 2017-02-09 ENCOUNTER — Ambulatory Visit
Admission: RE | Admit: 2017-02-09 | Discharge: 2017-02-09 | Disposition: A | Discharge status: Home / Self Care | Payer: PPO | Source: Ambulatory Visit | Attending: Physician Assistant | Admitting: Physician Assistant

## 2017-02-09 ENCOUNTER — Telehealth: Payer: Self-pay

## 2017-02-09 DIAGNOSIS — N6489 Other specified disorders of breast: Secondary | ICD-10-CM

## 2017-02-09 DIAGNOSIS — R928 Other abnormal and inconclusive findings on diagnostic imaging of breast: Secondary | ICD-10-CM | POA: Diagnosis not present

## 2017-02-09 DIAGNOSIS — Z1231 Encounter for screening mammogram for malignant neoplasm of breast: Secondary | ICD-10-CM | POA: Insufficient documentation

## 2017-02-09 DIAGNOSIS — Z1239 Encounter for other screening for malignant neoplasm of breast: Secondary | ICD-10-CM

## 2017-02-09 NOTE — Telephone Encounter (Signed)
Patient advised as below. Patient verbalizes understanding and is in agreement with treatment plan. Patient reports that she called [facility] today and they told her to wait for an appointment. Patient reports that she will be going out of town next week and will be gone for a week.

## 2017-02-09 NOTE — Telephone Encounter (Signed)
-----   Message from Mar Daring, [place] sent at 02/09/2017  9:36 AM EDT ----- Mammogram is abnormal in right breast. [facility] should contact her to schedule diagnostic mammogram and US of the right breast. If she has not heard from them in the next couple of days (by Friday) she can call us to schedule.

## 2017-02-09 NOTE — Telephone Encounter (Signed)
Noted  

## 2017-02-21 ENCOUNTER — Telehealth: Payer: Self-pay

## 2017-02-21 ENCOUNTER — Ambulatory Visit
Admission: RE | Admit: 2017-02-21 | Discharge: 2017-02-21 | Disposition: A | Discharge status: Home / Self Care | Payer: PPO | Source: Ambulatory Visit | Attending: Physician Assistant | Admitting: Physician Assistant

## 2017-02-21 ENCOUNTER — Other Ambulatory Visit: Payer: Self-pay | Admitting: Physician Assistant

## 2017-02-21 DIAGNOSIS — N6312 Unspecified lump in the right breast, upper inner quadrant: Secondary | ICD-10-CM | POA: Insufficient documentation

## 2017-02-21 DIAGNOSIS — N6489 Other specified disorders of breast: Secondary | ICD-10-CM

## 2017-02-21 DIAGNOSIS — R928 Other abnormal and inconclusive findings on diagnostic imaging of breast: Secondary | ICD-10-CM

## 2017-02-21 DIAGNOSIS — N6310 Unspecified lump in the right breast, unspecified quadrant: Secondary | ICD-10-CM

## 2017-02-21 NOTE — Telephone Encounter (Signed)
Pt advised. Per Tawanna Sat, the hospital sent over orders to sign. Advised pt the scheduling is in progress, and she should receive phone call soon to schedule this. Renaldo Fiddler, CMA

## 2017-02-21 NOTE — Telephone Encounter (Signed)
-----   Message from Mar Daring, [place] sent at 02/21/2017 12:29 PM EDT ----- The [place] and diagnostic mammogram was inconclusive and they are recommending biopsy. Can we see if they offered to schedule her at the hospital for this or does she have a surgeon that she may wish to be referred to for this?

## 2017-02-28 ENCOUNTER — Ambulatory Visit
Admission: RE | Admit: 2017-02-28 | Discharge: 2017-02-28 | Disposition: A | Discharge status: Home / Self Care | Payer: PPO | Source: Ambulatory Visit | Attending: Physician Assistant | Admitting: Physician Assistant

## 2017-02-28 DIAGNOSIS — N6310 Unspecified lump in the right breast, unspecified quadrant: Secondary | ICD-10-CM | POA: Diagnosis not present

## 2017-02-28 DIAGNOSIS — R928 Other abnormal and inconclusive findings on diagnostic imaging of breast: Secondary | ICD-10-CM | POA: Insufficient documentation

## 2017-02-28 DIAGNOSIS — N6031 Fibrosclerosis of right breast: Secondary | ICD-10-CM | POA: Diagnosis not present

## 2017-02-28 DIAGNOSIS — N6312 Unspecified lump in the right breast, upper inner quadrant: Secondary | ICD-10-CM | POA: Diagnosis not present

## 2017-02-28 DIAGNOSIS — R922 Inconclusive mammogram: Secondary | ICD-10-CM | POA: Diagnosis not present

## 2017-03-01 LAB — SURGICAL PATHOLOGY

## 2017-03-10 DIAGNOSIS — E119 Type 2 diabetes mellitus without complications: Secondary | ICD-10-CM | POA: Diagnosis not present

## 2017-03-10 DIAGNOSIS — H524 Presbyopia: Secondary | ICD-10-CM | POA: Diagnosis not present

## 2017-03-10 DIAGNOSIS — H21233 Degeneration of iris (pigmentary), bilateral: Secondary | ICD-10-CM | POA: Diagnosis not present

## 2017-03-10 DIAGNOSIS — H40013 Open angle with borderline findings, low risk, bilateral: Secondary | ICD-10-CM | POA: Diagnosis not present

## 2017-03-10 LAB — HM DIABETES EYE EXAM

## 2017-03-21 DIAGNOSIS — L720 Epidermal cyst: Secondary | ICD-10-CM | POA: Diagnosis not present

## 2017-03-21 DIAGNOSIS — L820 Inflamed seborrheic keratosis: Secondary | ICD-10-CM | POA: Diagnosis not present

## 2017-03-21 DIAGNOSIS — L812 Freckles: Secondary | ICD-10-CM | POA: Diagnosis not present

## 2017-03-21 DIAGNOSIS — L570 Actinic keratosis: Secondary | ICD-10-CM | POA: Diagnosis not present

## 2017-03-21 DIAGNOSIS — L821 Other seborrheic keratosis: Secondary | ICD-10-CM | POA: Diagnosis not present

## 2017-03-30 ENCOUNTER — Encounter: Payer: Self-pay | Admitting: Physician Assistant

## 2017-05-23 ENCOUNTER — Telehealth: Payer: Self-pay | Admitting: Physician Assistant

## 2017-05-23 DIAGNOSIS — E7800 Pure hypercholesterolemia, unspecified: Secondary | ICD-10-CM

## 2017-05-23 DIAGNOSIS — K219 Gastro-esophageal reflux disease without esophagitis: Secondary | ICD-10-CM

## 2017-05-23 DIAGNOSIS — I10 Essential (primary) hypertension: Secondary | ICD-10-CM

## 2017-05-23 MED ORDER — AMLODIPINE BESYLATE 5 MG PO TABS: 5.0000 mg | ORAL_TABLET | Freq: Every day | ORAL | 3 refills | Status: DC

## 2017-05-23 MED ORDER — LISINOPRIL-HYDROCHLOROTHIAZIDE 10-12.5 MG PO TABS: 1.0000 | ORAL_TABLET | Freq: Every day | ORAL | 3 refills | Status: DC

## 2017-05-23 MED ORDER — ATORVASTATIN CALCIUM 10 MG PO TABS: 10.0000 mg | ORAL_TABLET | Freq: Every day | ORAL | 3 refills | Status: DC

## 2017-05-23 MED ORDER — OMEPRAZOLE 20 MG PO CPDR: DELAYED_RELEASE_CAPSULE | ORAL | 3 refills | Status: DC

## 2017-05-23 NOTE — Telephone Encounter (Signed)
Pt is requesting a call back to discuss her medication.  CB#[phone]

## 2017-05-23 NOTE — Telephone Encounter (Signed)
Called patient and left VM. Can someone please try her again in the morning to see what questions she has. Orders were placed to refill her atorvastatin, omeprazole, lisinopril-hctz and amlodipine.

## 2018-02-22 ENCOUNTER — Telehealth: Payer: Self-pay | Admitting: Physician Assistant

## 2018-02-22 DIAGNOSIS — Z1239 Encounter for other screening for malignant neoplasm of breast: Secondary | ICD-10-CM

## 2018-02-22 DIAGNOSIS — R928 Other abnormal and inconclusive findings on diagnostic imaging of breast: Secondary | ICD-10-CM

## 2018-02-22 NOTE — Telephone Encounter (Signed)
LMTCMB about scheduliung MWVw [facility].  Last appt 12/2016 so she is ready to schedule.

## 2018-02-22 NOTE — Telephone Encounter (Signed)
Mammo ordered.

## 2018-02-22 NOTE — Telephone Encounter (Signed)
°  Scheduled MWV and CPE but patient wants to have an order sent to [facility] for her 6 month FU mammogram  Pt's call back is [phone]  [facility]

## 2018-02-22 NOTE — Telephone Encounter (Signed)
Patient advised.

## 2018-02-28 HISTORY — PX: BREAST BIOPSY: SHX20

## 2018-03-02 ENCOUNTER — Other Ambulatory Visit: Payer: Self-pay | Admitting: Physician Assistant

## 2018-03-02 DIAGNOSIS — I10 Essential (primary) hypertension: Secondary | ICD-10-CM

## 2018-03-02 DIAGNOSIS — E7800 Pure hypercholesterolemia, unspecified: Secondary | ICD-10-CM

## 2018-03-02 DIAGNOSIS — K219 Gastro-esophageal reflux disease without esophagitis: Secondary | ICD-10-CM

## 2018-03-10 DIAGNOSIS — E119 Type 2 diabetes mellitus without complications: Secondary | ICD-10-CM | POA: Diagnosis not present

## 2018-03-10 DIAGNOSIS — H40013 Open angle with borderline findings, low risk, bilateral: Secondary | ICD-10-CM | POA: Diagnosis not present

## 2018-03-10 DIAGNOSIS — H524 Presbyopia: Secondary | ICD-10-CM | POA: Diagnosis not present

## 2018-03-10 DIAGNOSIS — H21233 Degeneration of iris (pigmentary), bilateral: Secondary | ICD-10-CM | POA: Diagnosis not present

## 2018-03-10 DIAGNOSIS — H5213 Myopia, bilateral: Secondary | ICD-10-CM | POA: Diagnosis not present

## 2018-03-10 LAB — HM DIABETES EYE EXAM

## 2018-04-07 ENCOUNTER — Ambulatory Visit
Admission: RE | Admit: 2018-04-07 | Discharge: 2018-04-07 | Disposition: A | Discharge status: Home / Self Care | Payer: PPO | Source: Ambulatory Visit | Attending: Physician Assistant | Admitting: Physician Assistant

## 2018-04-07 ENCOUNTER — Telehealth: Payer: Self-pay

## 2018-04-07 DIAGNOSIS — R928 Other abnormal and inconclusive findings on diagnostic imaging of breast: Secondary | ICD-10-CM | POA: Diagnosis not present

## 2018-04-07 DIAGNOSIS — Z1231 Encounter for screening mammogram for malignant neoplasm of breast: Secondary | ICD-10-CM | POA: Diagnosis not present

## 2018-04-07 DIAGNOSIS — Z1239 Encounter for other screening for malignant neoplasm of breast: Secondary | ICD-10-CM

## 2018-04-07 NOTE — Telephone Encounter (Signed)
-----   Message from Mar Daring, PA-C sent at 04/07/2018 10:45 AM EDT ----- Normal mammogram. Repeat screening in one year.

## 2018-04-07 NOTE — Telephone Encounter (Signed)
Viewed by [name] on 04/07/2018 12:22 PM

## 2018-04-19 ENCOUNTER — Ambulatory Visit (INDEPENDENT_AMBULATORY_CARE_PROVIDER_SITE_OTHER): Payer: PPO

## 2018-04-19 ENCOUNTER — Encounter: Payer: Self-pay | Admitting: Physician Assistant

## 2018-04-19 ENCOUNTER — Ambulatory Visit (INDEPENDENT_AMBULATORY_CARE_PROVIDER_SITE_OTHER): Payer: PPO | Admitting: Physician Assistant

## 2018-04-19 VITALS — BP 122/62 | HR 75 | Temp 98.8°F | Ht 63.0 in | Wt 181.0 lb

## 2018-04-19 VITALS — BP 122/62 | HR 75 | Temp 98.8°F | Resp 16 | Ht 63.0 in | Wt 181.0 lb

## 2018-04-19 DIAGNOSIS — E7800 Pure hypercholesterolemia, unspecified: Secondary | ICD-10-CM | POA: Diagnosis not present

## 2018-04-19 DIAGNOSIS — E1165 Type 2 diabetes mellitus with hyperglycemia: Secondary | ICD-10-CM

## 2018-04-19 DIAGNOSIS — I10 Essential (primary) hypertension: Secondary | ICD-10-CM

## 2018-04-19 DIAGNOSIS — E049 Nontoxic goiter, unspecified: Secondary | ICD-10-CM | POA: Diagnosis not present

## 2018-04-19 DIAGNOSIS — Z0000 Encounter for general adult medical examination without abnormal findings: Secondary | ICD-10-CM | POA: Diagnosis not present

## 2018-04-19 NOTE — Progress Notes (Signed)
Subjective:   Anne Parrish is a 67 y.o. female who presents for Medicare Annual (Subsequent) preventive examination.  Review of Systems:  N/A  Cardiac Risk Factors include: advanced age (>84men, >31 women);diabetes mellitus;dyslipidemia;hypertension;obesity (BMI >30kg/m2)     Objective:     Vitals: BP 122/62 (BP Location: Right Arm)   Pulse 75   Temp 98.8 F (37.1 C) (Oral)   Ht 5\' 3"  (1.6 m)   Wt 181 lb (82.1 kg)   BMI 32.06 kg/m   Body mass index is 32.06 kg/m.  Advanced Directives 04/19/2018 04/15/2016 12/30/2015 12/30/2015 10/16/2015 08/13/2015  Does Patient Have a Medical Advance Directive? No No No Yes No No  Type of Advance Directive - - Public librarian;Living will - -  Does patient want to make changes to medical advance directive? Yes (MAU/Ambulatory/Procedural Areas - Information given) - - - - -  Would patient like information on creating a medical advance directive? - No - patient declined information - - - -    Tobacco Social History   Tobacco Use  Smoking Status Never Smoker  Smokeless Tobacco Never Used     Counseling given: Not Answered   Clinical Intake:  Pre-visit preparation completed: Yes  Pain : No/denies pain Pain Score: 0-No pain     Nutritional Status: BMI > 30  Obese Nutritional Risks: None Diabetes: Yes(type 2) CBG done?: No Did pt. bring in CBG monitor from home?: No  How often do you need to have someone help you when you read instructions, pamphlets, or other written materials from your doctor or pharmacy?: 1 - Never  Interpreter Needed?: No  Information entered by :: [facility], LPN  Past Medical History:  Diagnosis Date  . Hyperlipidemia   . Hypertension    Past Surgical History:  Procedure Laterality Date  . APPENDECTOMY  1960's  . BREAST BIOPSY Right 02/28/2018   neg  . COLONOSCOPY WITH PROPOFOL N/A 04/15/2016   Procedure: COLONOSCOPY WITH PROPOFOL;  Surgeon: Manya Silvas, MD;  Location: [facility]  ENDOSCOPY;  Service: Endoscopy;  Laterality: N/A;  . REFRACTIVE SURGERY    . TONSILLECTOMY AND ADENOIDECTOMY  1975   Family History  Problem Relation Age of Onset  . Cancer Mother        lung  . Thyroid disease Mother   . Cancer Father        prostate  . Heart disease Father   . Cancer Brother        prostate   Social History   Socioeconomic History  . Marital status: Divorced    Spouse name: Not on file  . Number of children: 2  . Years of education: Not on file  . Highest education level: Some college, no degree  Occupational History  . Occupation: retired  . Occupation: owns Engineer, building services  Social Needs  . Financial resource strain: Not hard at all  . Food insecurity:    Worry: Never true    Inability: Never true  . Transportation needs:    Medical: No    Non-medical: No  Tobacco Use  . Smoking status: Never Smoker  . Smokeless tobacco: Never Used  Substance and Sexual Activity  . Alcohol use: No    Alcohol/week: 0.0 oz  . Drug use: No  . Sexual activity: Not on file  Lifestyle  . Physical activity:    Days per week: Not on file    Minutes per session: Not on file  . Stress: Not at all  Relationships  . Social connections:    Talks on phone: Not on file    Gets together: Not on file    Attends religious service: Not on file    Active member of club or organization: Not on file    Attends meetings of clubs or organizations: Not on file    Relationship status: Not on file  Other Topics Concern  . Not on file  Social History Narrative  . Not on file    Outpatient Encounter Medications as of 04/19/2018  Medication Sig  . acetaminophen (TYLENOL) 650 MG CR tablet Take 650 mg by mouth every 8 (eight) hours as needed for pain.  Marland Kitchen amLODipine (NORVASC) 5 MG tablet TAKE 1 TABLET BY MOUTH DAILY  . aspirin 81 MG tablet Take 1 tablet (81 mg total) by mouth daily.  Marland Kitchen atorvastatin (LIPITOR) 10 MG tablet TAKE 1 TABLET BY MOUTH DAILY  . Bioflavonoid Products  (BIOFLEX PO) Take by mouth daily.   Marland Kitchen glucose blood (ONETOUCH VERIO) test strip To check blood sugar once a day.  DX: E11.9  . lisinopril-hydrochlorothiazide (PRINZIDE,ZESTORETIC) 10-12.5 MG tablet TAKE 1 TABLET BY MOUTH DAILY  . Multiple Vitamin (MULTIVITAMIN) capsule Take 1 capsule by mouth daily.  Marland Kitchen omeprazole (PRILOSEC) 20 MG capsule TAKE 1 CAPSULE BY MOUTH DAILY  . ONETOUCH DELICA LANCETS FINE MISC To check blood sugars once a day.  DX:  E11.9  . Probiotic Product (PROBIOTIC DAILY PO) Take by mouth.   No facility-administered encounter medications on file as of 04/19/2018.     Activities of Daily Living In your present state of health, do you have any difficulty performing the following activities: 04/19/2018  Hearing? N  Vision? N  Difficulty concentrating or making decisions? N  Walking or climbing stairs? N  Dressing or bathing? N  Doing errands, shopping? N  Preparing Food and eating ? N  Using the Toilet? N  In the past six months, have you accidently leaked urine? Y  Comment Occasionally when does not go with urges.  Do you have problems with loss of bowel control? Y  Comment Rare  Managing your Medications? N  Managing your Finances? N  Housekeeping or managing your Housekeeping? N  Some recent data might be hidden    Patient Care Team: Mar Daring, PA-C as PCP - General (Family Medicine) Thelma Comp, [place] as Consulting Physician (Optometry)    Assessment:   This is a routine wellness examination for [name].  Exercise Activities and Dietary recommendations Current Exercise Habits: The patient does not participate in regular exercise at present, Exercise limited by: None identified  Goals    . Exercise 3x per week (30 min per time)     Recommend to start walking 3 days a week for at least 30 minutes at a time.        Fall Risk Fall Risk  04/19/2018 12/30/2016 12/30/2015 10/16/2015  Falls in the past year? No No No No   Is the patient's home free of  loose throw rugs in walkways, pet beds, electrical cords, etc?   yes      Grab bars in the bathroom? yes      Handrails on the stairs?   no      Adequate lighting?   yes  Timed Get Up and Go performed: N/A  Depression Screen PHQ 2/9 Scores 04/19/2018 04/19/2018 12/30/2016 12/30/2015  PHQ - 2 Score 0 0 0 0  PHQ- 9 Score 1 - 0 -  Cognitive Function: Pt declined screening today.         Immunization History  Administered Date(s) Administered  . Pneumococcal Conjugate-13 08/13/2015  . Pneumococcal Polysaccharide-23 12/30/2016    Qualifies for Shingles Vaccine? Due for Shingles vaccine. Declined my offer to administer today. Education has been provided regarding the importance of this vaccine. Pt has been advised to call her insurance company to determine her out of pocket expense. Advised she may also receive this vaccine at her local pharmacy or Health Dept. Verbalized acceptance and understanding.  Screening Tests Health Maintenance  Topic Date Due  . FOOT EXAM  03/09/1960  . TETANUS/TDAP  03/09/1969  . HEMOGLOBIN A1C  05/15/2017  . OPHTHALMOLOGY EXAM  03/10/2018  . INFLUENZA VACCINE  04/13/2018  . MAMMOGRAM  04/07/2020  . COLONOSCOPY  04/15/2021  . DEXA SCAN  Completed  . Hepatitis C Screening  Completed  . PNA vac Low Risk Adult  Completed    Cancer Screenings: Lung: Low Dose CT Chest recommended if Age 68-80 years, 30 pack-year currently smoking OR have quit w/in 15years. Patient does not qualify. Breast:  Up to date on Mammogram? Yes   Up to date of Bone Density/Dexa? Yes Colorectal: Up to date  Additional Screenings:  Hepatitis C Screening: Up to date     Plan:  I have personally reviewed and addressed the Medicare Annual Wellness questionnaire and have noted the following in the patient's chart:  A. Medical and social history B. Use of alcohol, tobacco or illicit drugs  C. Current medications and supplements D. Functional ability and status E.  Nutritional  status F.  Physical activity G. Advance directives H. List of other physicians I.  Hospitalizations, surgeries, and ER visits in previous 12 months J.  Kirkpatrick such as hearing and vision if needed, cognitive and depression L. Referrals and appointments - none  In addition, I have reviewed and discussed with patient certain preventive protocols, quality metrics, and best practice recommendations. A written personalized care plan for preventive services as well as general preventive health recommendations were provided to patient.  See attached scanned questionnaire for additional information.   Signed,  Fabio Neighbors, LPN Nurse Health Advisor   Nurse Recommendations: Pt needs a diabetic foot exam and Hgb A1c checked today. Pt states she has had an eye exam in 02/2018. Will contact [facility] to retrieve records. Pt declined the tetanus vaccine today.

## 2018-04-19 NOTE — Progress Notes (Signed)
Patient: Anne Parrish, Female    DOB: [birth year], 67 y.o.   MRN: [id number] Visit Date: 04/19/2018  Today's Provider: Mar Daring, PA-C   Chief Complaint  Patient presents with  . Annual Exam   Subjective:    Anne Parrish is a 67 y.o. female. She feels well. She reports exercising none. She reports she is sleeping well.  12/30/16 AWE 04/07/18 Mammogram-BI-RADS 2 01/19/16 BMD-osteopenia 04/15/16 Colonoscopy-polyps, recheck in 5 yrs -----------------------------------------------------------   Review of Systems  Constitutional: Negative.   HENT: Negative.   Eyes: Negative.   Respiratory: Negative.   Cardiovascular: Negative.   Gastrointestinal: Negative.   Endocrine: Negative.   Genitourinary: Negative.   Musculoskeletal: Negative.   Skin: Negative.   Allergic/Immunologic: Negative.   Neurological: Negative.   Hematological: Negative.   Psychiatric/Behavioral: Negative.     Social History   Socioeconomic History  . Marital status: Divorced    Spouse name: Not on file  . Number of children: 2  . Years of education: Not on file  . Highest education level: Some college, no degree  Occupational History  . Occupation: retired  . Occupation: owns Engineer, building services  Social Needs  . Financial resource strain: Not hard at all  . Food insecurity:    Worry: Never true    Inability: Never true  . Transportation needs:    Medical: No    Non-medical: No  Tobacco Use  . Smoking status: Never Smoker  . Smokeless tobacco: Never Used  Substance and Sexual Activity  . Alcohol use: No    Alcohol/week: 0.0 oz  . Drug use: No  . Sexual activity: Not on file  Lifestyle  . Physical activity:    Days per week: Not on file    Minutes per session: Not on file  . Stress: Not at all  Relationships  . Social connections:    Talks on phone: Not on file    Gets together: Not on file    Attends religious service: Not on file    Active member of club or  organization: Not on file    Attends meetings of clubs or organizations: Not on file    Relationship status: Not on file  . Intimate partner violence:    Fear of current or ex partner: Not on file    Emotionally abused: Not on file    Physically abused: Not on file    Forced sexual activity: Not on file  Other Topics Concern  . Not on file  Social History Narrative  . Not on file    Past Medical History:  Diagnosis Date  . Hyperlipidemia   . Hypertension      Patient Active Problem List   Diagnosis Date Noted  . Colon polyp 04/23/2016  . Diabetes type 2, uncontrolled ([place]) 10/16/2015  . GERD (gastroesophageal reflux disease) 08/13/2015  . Allergic rhinitis 07/18/2015  . Goiter 07/18/2015  . BP (high blood pressure) 07/18/2015  . Hypercholesteremia 07/18/2015    Past Surgical History:  Procedure Laterality Date  . APPENDECTOMY  1960's  . BREAST BIOPSY Right 02/28/2018   neg  . COLONOSCOPY WITH PROPOFOL N/A 04/15/2016   Procedure: COLONOSCOPY WITH PROPOFOL;  Surgeon: Manya Silvas, MD;  Location: [facility] ENDOSCOPY;  Service: Endoscopy;  Laterality: N/A;  . REFRACTIVE SURGERY    . TONSILLECTOMY AND ADENOIDECTOMY  1975    Her family history includes Cancer in her brother, father, and mother; Heart disease in her father; Thyroid  disease in her mother.      Current Outpatient Medications:  .  acetaminophen (TYLENOL) 650 MG CR tablet, Take 650 mg by mouth every 8 (eight) hours as needed for pain., Disp: , Rfl:  .  amLODipine (NORVASC) 5 MG tablet, TAKE 1 TABLET BY MOUTH DAILY, Disp: 90 tablet, Rfl: 3 .  aspirin 81 MG tablet, Take 1 tablet (81 mg total) by mouth daily., Disp: 1 tablet, Rfl: 0 .  atorvastatin (LIPITOR) 10 MG tablet, TAKE 1 TABLET BY MOUTH DAILY, Disp: 90 tablet, Rfl: 3 .  Bioflavonoid Products (BIOFLEX PO), Take by mouth daily. , Disp: , Rfl:  .  glucose blood (ONETOUCH VERIO) test strip, To check blood sugar once a day.  DX: E11.9, Disp: 100 each, Rfl: 3 .   lisinopril-hydrochlorothiazide (PRINZIDE,ZESTORETIC) 10-12.5 MG tablet, TAKE 1 TABLET BY MOUTH DAILY, Disp: 90 tablet, Rfl: 3 .  Multiple Vitamin (MULTIVITAMIN) capsule, Take 1 capsule by mouth daily., Disp: , Rfl:  .  omeprazole (PRILOSEC) 20 MG capsule, TAKE 1 CAPSULE BY MOUTH DAILY, Disp: 90 capsule, Rfl: 3 .  ONETOUCH DELICA LANCETS FINE MISC, To check blood sugars once a day.  DX:  E11.9, Disp: 100 each, Rfl: 1 .  Probiotic Product (PROBIOTIC DAILY PO), Take by mouth., Disp: , Rfl:   Patient Care Team: Mar Daring, PA-C as PCP - General (Family Medicine) Thelma Comp, OD as Consulting Physician (Optometry)     Objective:   Vitals: BP 122/62 (BP Location: Right Arm, Patient Position: Sitting, Cuff Size: Normal)   Pulse 75   Temp 98.8 F (37.1 C) (Oral)   Resp 16   Ht 5\' 3"  (1.6 m)   Wt 181 lb (82.1 kg)   BMI 32.06 kg/m   Physical Exam  Constitutional: She is oriented to person, place, and time. She appears well-developed and well-nourished. No distress.  HENT:  Head: Normocephalic and atraumatic.  Right Ear: Hearing, tympanic membrane, external ear and ear canal normal.  Left Ear: Hearing, tympanic membrane, external ear and ear canal normal.  Nose: Nose normal.  Mouth/Throat: Uvula is midline, oropharynx is clear and moist and mucous membranes are normal. No oropharyngeal exudate.  Eyes: Pupils are equal, round, and reactive to light. Conjunctivae and EOM are normal. Right eye exhibits no discharge. Left eye exhibits no discharge. No scleral icterus.  Neck: Normal range of motion. Neck supple. No JVD present. Carotid bruit is not present. No tracheal deviation present. No thyromegaly present.  Cardiovascular: Normal rate, regular rhythm, normal heart sounds and intact distal pulses. Exam reveals no gallop and no friction rub.  No murmur heard. Pulses:      Dorsalis pedis pulses are 2+ on the right side, and 2+ on the left side.       Posterior tibial pulses are  2+ on the right side, and 2+ on the left side.  Pulmonary/Chest: Effort normal and breath sounds normal. No respiratory distress. She has no wheezes. She has no rales. She exhibits no tenderness.  Abdominal: Soft. Bowel sounds are normal. She exhibits no distension and no mass. There is no tenderness. There is no rebound and no guarding.  Musculoskeletal: Normal range of motion. She exhibits no edema or tenderness.       Right foot: There is normal range of motion and no deformity.       Left foot: There is normal range of motion and no deformity.  Feet:  Right Foot:  Protective Sensation: 10 sites tested. 10 sites sensed.  Skin Integrity: Negative for ulcer, blister, skin breakdown, erythema, warmth, callus or dry skin.  Left Foot:  Protective Sensation: 10 sites tested. 10 sites sensed.  Skin Integrity: Negative for ulcer, blister, skin breakdown, erythema, warmth, callus or dry skin.  Lymphadenopathy:    She has no cervical adenopathy.  Neurological: She is alert and oriented to person, place, and time.  Skin: Skin is warm and dry. No rash noted. She is not diaphoretic.  Psychiatric: She has a normal mood and affect. Her behavior is normal. Judgment and thought content normal.  Vitals reviewed.   Activities of Daily Living In your present state of health, do you have any difficulty performing the following activities: 04/19/2018  Hearing? N  Vision? N  Difficulty concentrating or making decisions? N  Walking or climbing stairs? N  Dressing or bathing? N  Doing errands, shopping? N  Preparing Food and eating ? N  Using the Toilet? N  In the past six months, have you accidently leaked urine? Y  Comment Occasionally when does not go with urges.  Do you have problems with loss of bowel control? Y  Comment Rare  Managing your Medications? N  Managing your Finances? N  Housekeeping or managing your Housekeeping? N  Some recent data might be hidden    Fall Risk Assessment Fall  Risk  04/19/2018 12/30/2016 12/30/2015 10/16/2015  Falls in the past year? No No No No     Depression Screen PHQ 2/9 Scores 04/19/2018 04/19/2018 12/30/2016 12/30/2015  PHQ - 2 Score 0 0 0 0  PHQ- 9 Score 1 - 0 -    Cognitive Testing - 6-CIT  Correct? Score   What year is it? yes 0 0 or 4  What month is it? yes 0 0 or 3  Memorize:    [name],  41,  [address],  [place],      What time is it? (within 1 hour) yes 0 0 or 3  Count backwards from 20 yes 0 0, 2, or 4  Name the months of the year yes 0 0, 2, or 4  Repeat name & address above no 2 0, 2, 4, 6, 8, or 10       TOTAL SCORE  2/28   Interpretation:  Normal  Normal (0-7) Abnormal (8-28)       Assessment & Plan:    Annual Physical Reviewed patient's Family Medical History Reviewed and updated list of patient's medical providers Assessment of cognitive impairment was done Assessed patient's functional ability Established a written schedule for health screening [place] Completed and Reviewed  Exercise Activities and Dietary recommendations Goals    . Exercise 3x per week (30 min per time)     Recommend to start walking 3 days a week for at least 30 minutes at a time.        Immunization History  Administered Date(s) Administered  . Pneumococcal Conjugate-13 08/13/2015  . Pneumococcal Polysaccharide-23 12/30/2016    Health Maintenance  Topic Date Due  . FOOT EXAM  03/09/1960  . TETANUS/TDAP  03/09/1969  . HEMOGLOBIN A1C  05/15/2017  . OPHTHALMOLOGY EXAM  03/10/2018  . INFLUENZA VACCINE  04/13/2018  . MAMMOGRAM  04/07/2020  . COLONOSCOPY  04/15/2021  . DEXA SCAN  Completed  . Hepatitis C Screening  Completed  . PNA vac Low Risk Adult  Completed     Discussed health benefits of physical activity, and encouraged her to engage in regular exercise appropriate for her  age and condition.   1. Essential hypertension Stable. Continue amlodipine 5mg , lisinopril-hctz 10-12.5mg . Will check labs as  below and f/u pending results. - CBC w/Diff/Platelet - Comprehensive Metabolic Panel (CMET) - HgB A1c - Lipid Profile  2. Uncontrolled type 2 diabetes mellitus with hyperglycemia (HCC) Diet controlled. Will check labs as below and f/u pending results. - CBC w/Diff/Platelet - Comprehensive Metabolic Panel (CMET) - HgB A1c - Lipid Profile  3. Goiter H/O this. Has been stable. Will check labs as below and f/u pending results. - CBC w/Diff/Platelet - Comprehensive Metabolic Panel (CMET) - HgB A1c - Lipid Profile -TSH   4. Hypercholesteremia Stable. Continue Atrovastatin 10mg . Will check labs as below and f/u pending results. - CBC w/Diff/Platelet - Comprehensive Metabolic Panel (CMET) - HgB A1c - Lipid Profile  ------------------------------------------------------------------------------------------------------------    Mar Daring, PA-C  [facility]

## 2018-04-19 NOTE — Patient Instructions (Signed)

## 2018-04-19 NOTE — Patient Instructions (Signed)
[name] , Thank you for taking time to come for your Medicare Wellness Visit. I appreciate your ongoing commitment to your health goals. Please review the following plan we discussed and let me know if I can assist you in the future.   Screening recommendations/referrals: Colonoscopy: Up to date Mammogram: Up to date Bone Density: Up to date Recommended yearly ophthalmology/optometry visit for glaucoma screening and checkup Recommended yearly dental visit for hygiene and checkup  Vaccinations: Influenza vaccine: N/A Pneumococcal vaccine: Up to date Tdap vaccine: Pt declines today.  Shingles vaccine: Pt declines today.     Advanced directives: Please bring a copy of your POA (Power of Attorney) and/or Living Will to your next appointment.   Conditions/risks identified: Obesity- recommend to start walking 3 days a week for at least 30 minutes at a time.   Next appointment: 10:00 AM today with Anne Parrish.   Preventive Care 34 Years and Older, Female Preventive care refers to lifestyle choices and visits with your health care provider that can promote health and wellness. What does preventive care include?  A yearly physical exam. This is also called an annual well check.  Dental exams once or twice a year.  Routine eye exams. Ask your health care provider how often you should have your eyes checked.  Personal lifestyle choices, including:  Daily care of your teeth and gums.  Regular physical activity.  Eating a healthy diet.  Avoiding tobacco and drug use.  Limiting alcohol use.  Practicing safe sex.  Taking low-dose aspirin every day.  Taking vitamin and mineral supplements as recommended by your health care provider. What happens during an annual well check? The services and screenings done by your health care provider during your annual well check will depend on your age, overall health, lifestyle risk factors, and family history of disease. Counseling    Your health care provider may ask you questions about your:  Alcohol use.  Tobacco use.  Drug use.  Emotional well-being.  Home and relationship well-being.  Sexual activity.  Eating habits.  History of falls.  Memory and ability to understand (cognition).  Work and work Statistician.  Reproductive health. Screening  You may have the following tests or measurements:  Height, weight, and BMI.  Blood pressure.  Lipid and cholesterol levels. These may be checked every 5 years, or more frequently if you are over 71 years old.  Skin check.  Lung cancer screening. You may have this screening every year starting at age 35 if you have a 30-pack-year history of smoking and currently smoke or have quit within the past 15 years.  Fecal occult blood test (FOBT) of the stool. You may have this test every year starting at age 62.  Flexible sigmoidoscopy or colonoscopy. You may have a sigmoidoscopy every 5 years or a colonoscopy every 10 years starting at age 45.  Hepatitis C blood test.  Hepatitis B blood test.  Sexually transmitted disease (STD) testing.  Diabetes screening. This is done by checking your blood sugar (glucose) after you have not eaten for a while (fasting). You may have this done every 1-3 years.  Bone density scan. This is done to screen for osteoporosis. You may have this done starting at age 71.  Mammogram. This may be done every 1-2 years. Talk to your health care provider about how often you should have regular mammograms. Talk with your health care provider about your test results, treatment options, and if necessary, the need for more tests. Vaccines  Your health care provider may recommend certain vaccines, such as:  Influenza vaccine. This is recommended every year.  Tetanus, diphtheria, and acellular pertussis (Tdap, Td) vaccine. You may need a Td booster every 10 years.  Zoster vaccine. You may need this after age 17.  Pneumococcal 13-valent  conjugate (PCV13) vaccine. One dose is recommended after age 14.  Pneumococcal polysaccharide (PPSV23) vaccine. One dose is recommended after age 55. Talk to your health care provider about which screenings and vaccines you need and how often you need them. This information is not intended to replace advice given to you by your health care provider. Make sure you discuss any questions you have with your health care provider. Document Released: 09/26/2015 Document Revised: 05/19/2016 Document Reviewed: 07/01/2015 Elsevier Interactive Patient Education  2017 [place] Prevention in the Home Falls can cause injuries. They can happen to people of all ages. There are many things you can do to make your home safe and to help prevent falls. What can I do on the outside of my home?  Regularly fix the edges of walkways and driveways and fix any cracks.  Remove anything that might make you trip as you walk through a door, such as a raised step or threshold.  Trim any bushes or trees on the path to your home.  Use bright outdoor lighting.  Clear any walking paths of anything that might make someone trip, such as rocks or tools.  Regularly check to see if handrails are loose or broken. Make sure that both sides of any steps have handrails.  Any raised decks and porches should have guardrails on the edges.  Have any leaves, snow, or ice cleared regularly.  Use sand or salt on walking paths during winter.  Clean up any spills in your garage right away. This includes oil or grease spills. What can I do in the bathroom?  Use night lights.  Install grab bars by the toilet and in the tub and shower. Do not use towel bars as grab bars.  Use non-skid mats or decals in the tub or shower.  If you need to sit down in the shower, use a plastic, non-slip stool.  Keep the floor dry. Clean up any water that spills on the floor as soon as it happens.  Remove soap buildup in the tub or  shower regularly.  Attach bath mats securely with double-sided non-slip rug tape.  Do not have throw rugs and other things on the floor that can make you trip. What can I do in the bedroom?  Use night lights.  Make sure that you have a light by your bed that is easy to reach.  Do not use any sheets or blankets that are too big for your bed. They should not hang down onto the floor.  Have a firm chair that has side arms. You can use this for support while you get dressed.  Do not have throw rugs and other things on the floor that can make you trip. What can I do in the kitchen?  Clean up any spills right away.  Avoid walking on wet floors.  Keep items that you use a lot in easy-to-reach places.  If you need to reach something above you, use a strong step stool that has a grab bar.  Keep electrical cords out of the way.  Do not use floor polish or wax that makes floors slippery. If you must use wax, use non-skid floor wax.  Do  not have throw rugs and other things on the floor that can make you trip. What can I do with my stairs?  Do not leave any items on the stairs.  Make sure that there are handrails on both sides of the stairs and use them. Fix handrails that are broken or loose. Make sure that handrails are as long as the stairways.  Check any carpeting to make sure that it is firmly attached to the stairs. Fix any carpet that is loose or worn.  Avoid having throw rugs at the top or bottom of the stairs. If you do have throw rugs, attach them to the floor with carpet tape.  Make sure that you have a light switch at the top of the stairs and the bottom of the stairs. If you do not have them, ask someone to add them for you. What else can I do to help prevent falls?  Wear shoes that:  Do not have high heels.  Have rubber bottoms.  Are comfortable and fit you well.  Are closed at the toe. Do not wear sandals.  If you use a stepladder:  Make sure that it is fully  opened. Do not climb a closed stepladder.  Make sure that both sides of the stepladder are locked into place.  Ask someone to hold it for you, if possible.  Clearly mark and make sure that you can see:  Any grab bars or handrails.  First and last steps.  Where the edge of each step is.  Use tools that help you move around (mobility aids) if they are needed. These include:  Canes.  Walkers.  Scooters.  Crutches.  Turn on the lights when you go into a dark area. Replace any light bulbs as soon as they burn out.  Set up your furniture so you have a clear path. Avoid moving your furniture around.  If any of your floors are uneven, fix them.  If there are any pets around you, be aware of where they are.  Review your medicines with your doctor. Some medicines can make you feel dizzy. This can increase your chance of falling. Ask your doctor what other things that you can do to help prevent falls. This information is not intended to replace advice given to you by your health care provider. Make sure you discuss any questions you have with your health care provider. Document Released: 06/26/2009 Document Revised: 02/05/2016 Document Reviewed: 10/04/2014 Elsevier Interactive Patient Education  2017 [employer].

## 2018-04-20 ENCOUNTER — Telehealth: Payer: Self-pay

## 2018-04-20 LAB — CBC WITH DIFFERENTIAL/PLATELET
BASOS: 0 %
Basophils Absolute: 0 10*3/uL (ref 0.0–0.2)
EOS (ABSOLUTE): 0.1 10*3/uL (ref 0.0–0.4)
EOS: 3 %
HEMOGLOBIN: 12 g/dL (ref 11.1–15.9)
Hematocrit: 35.2 % (ref 34.0–46.6)
Immature Grans (Abs): 0 10*3/uL (ref 0.0–0.1)
Immature Granulocytes: 0 %
LYMPHS ABS: 1.4 10*3/uL (ref 0.7–3.1)
Lymphs: 34 %
MCH: 29.6 pg (ref 26.6–33.0)
MCHC: 34.1 g/dL (ref 31.5–35.7)
MCV: 87 fL (ref 79–97)
MONOS ABS: 0.4 10*3/uL (ref 0.1–0.9)
Monocytes: 9 %
NEUTROS ABS: 2.2 10*3/uL (ref 1.4–7.0)
Neutrophils: 54 %
Platelets: 293 10*3/uL (ref 150–450)
RBC: 4.05 x10E6/uL (ref 3.77–5.28)
RDW: 13.1 % (ref 12.3–15.4)
WBC: 4 10*3/uL (ref 3.4–10.8)

## 2018-04-20 LAB — COMPREHENSIVE METABOLIC PANEL
A/G RATIO: 1.8 (ref 1.2–2.2)
ALBUMIN: 4.4 g/dL (ref 3.6–4.8)
ALK PHOS: 71 IU/L (ref 39–117)
ALT: 14 IU/L (ref 0–32)
AST: 21 IU/L (ref 0–40)
BILIRUBIN TOTAL: 0.8 mg/dL (ref 0.0–1.2)
BUN / CREAT RATIO: 9 — AB (ref 12–28)
BUN: 8 mg/dL (ref 8–27)
CO2: 22 mmol/L (ref 20–29)
Calcium: 9.8 mg/dL (ref 8.7–10.3)
Chloride: 98 mmol/L (ref 96–106)
Creatinine, Ser: 0.85 mg/dL (ref 0.57–1.00)
GFR calc Af Amer: 81 mL/min/{1.73_m2} (ref >59)
GFR calc non Af Amer: 71 mL/min/{1.73_m2} (ref >59)
Globulin, Total: 2.4 g/dL (ref 1.5–4.5)
Glucose: 121 mg/dL — ABNORMAL HIGH (ref 65–99)
POTASSIUM: 4.2 mmol/L (ref 3.5–5.2)
SODIUM: 135 mmol/L (ref 134–144)
Total Protein: 6.8 g/dL (ref 6.0–8.5)

## 2018-04-20 LAB — LIPID PANEL
CHOL/HDL RATIO: 2.7 ratio (ref 0.0–4.4)
Cholesterol, Total: 140 mg/dL (ref 100–199)
HDL: 51 mg/dL (ref >39)
LDL Calculated: 68 mg/dL (ref 0–99)
Triglycerides: 106 mg/dL (ref 0–149)
VLDL Cholesterol Cal: 21 mg/dL (ref 5–40)

## 2018-04-20 LAB — HEMOGLOBIN A1C
ESTIMATED AVERAGE GLUCOSE: 160 mg/dL
HEMOGLOBIN A1C: 7.2 % — AB (ref 4.8–5.6)

## 2018-04-20 NOTE — Telephone Encounter (Signed)
LMTCB  Thanks,  -Joseline 

## 2018-04-20 NOTE — Telephone Encounter (Signed)
-----   Message from Mar Daring, [place] sent at 04/20/2018  3:19 PM EDT ----- Cholesterol is normal. A1c is up to 7.2 from 6.3. Really work on lifestyle modifications with limiting carbs and sugars. Kidney and liver function are normal. Blood count is normal.

## 2018-04-21 NOTE — Telephone Encounter (Signed)
Patient advised as below.  

## 2018-04-25 ENCOUNTER — Encounter: Payer: Self-pay | Admitting: Physician Assistant

## 2018-04-27 ENCOUNTER — Telehealth: Payer: Self-pay | Admitting: Physician Assistant

## 2018-04-27 DIAGNOSIS — E119 Type 2 diabetes mellitus without complications: Secondary | ICD-10-CM

## 2018-04-27 NOTE — Telephone Encounter (Signed)
Ok. Referral placed

## 2018-04-27 NOTE — Telephone Encounter (Signed)
Pt wants to be referred to someone for diabetes. Pt was in Last week and her A1C was 7.6.  Pt;s call back [phone]  Thanks teri

## 2018-04-27 NOTE — Telephone Encounter (Signed)
Please Review

## 2018-05-04 DIAGNOSIS — Z872 Personal history of diseases of the skin and subcutaneous tissue: Secondary | ICD-10-CM | POA: Diagnosis not present

## 2018-05-04 DIAGNOSIS — L920 Granuloma annulare: Secondary | ICD-10-CM | POA: Diagnosis not present

## 2018-05-04 DIAGNOSIS — L578 Other skin changes due to chronic exposure to nonionizing radiation: Secondary | ICD-10-CM | POA: Diagnosis not present

## 2018-05-04 DIAGNOSIS — L820 Inflamed seborrheic keratosis: Secondary | ICD-10-CM | POA: Diagnosis not present

## 2018-05-04 DIAGNOSIS — L720 Epidermal cyst: Secondary | ICD-10-CM | POA: Diagnosis not present

## 2018-05-04 DIAGNOSIS — L821 Other seborrheic keratosis: Secondary | ICD-10-CM | POA: Diagnosis not present

## 2019-03-09 ENCOUNTER — Other Ambulatory Visit: Payer: Self-pay | Admitting: Physician Assistant

## 2019-03-09 DIAGNOSIS — I10 Essential (primary) hypertension: Secondary | ICD-10-CM

## 2019-03-09 DIAGNOSIS — E7800 Pure hypercholesterolemia, unspecified: Secondary | ICD-10-CM

## 2019-03-10 ENCOUNTER — Other Ambulatory Visit: Payer: Self-pay | Admitting: Physician Assistant

## 2019-03-10 DIAGNOSIS — K219 Gastro-esophageal reflux disease without esophagitis: Secondary | ICD-10-CM

## 2019-04-30 ENCOUNTER — Ambulatory Visit: Payer: PPO

## 2019-04-30 ENCOUNTER — Encounter: Payer: PPO | Admitting: Physician Assistant

## 2019-04-30 DIAGNOSIS — E1165 Type 2 diabetes mellitus with hyperglycemia: Secondary | ICD-10-CM | POA: Diagnosis not present

## 2019-04-30 DIAGNOSIS — E782 Mixed hyperlipidemia: Secondary | ICD-10-CM | POA: Diagnosis not present

## 2019-05-04 DIAGNOSIS — H524 Presbyopia: Secondary | ICD-10-CM | POA: Diagnosis not present

## 2019-05-04 DIAGNOSIS — H5213 Myopia, bilateral: Secondary | ICD-10-CM | POA: Diagnosis not present

## 2019-05-04 DIAGNOSIS — H40013 Open angle with borderline findings, low risk, bilateral: Secondary | ICD-10-CM | POA: Diagnosis not present

## 2019-05-04 DIAGNOSIS — H21233 Degeneration of iris (pigmentary), bilateral: Secondary | ICD-10-CM | POA: Diagnosis not present

## 2019-05-04 DIAGNOSIS — E119 Type 2 diabetes mellitus without complications: Secondary | ICD-10-CM | POA: Diagnosis not present

## 2019-05-09 NOTE — Progress Notes (Signed)
Subjective:   Anne Parrish is a 68 y.o. female who presents for Medicare Annual (Subsequent) preventive examination.    This visit is being conducted through telemedicine due to the COVID-19 pandemic. This patient has given me verbal consent via doximity to conduct this visit, patient states they are participating from their home address. Some vital signs may be absent or patient reported.    Patient identification: identified by name, DOB, and current address  Review of Systems:  N/A  Cardiac Risk Factors include: advanced age (>48men, >41 women);diabetes mellitus;dyslipidemia;hypertension     Objective:     Vitals: There were no vitals taken for this visit.  There is no height or weight on file to calculate BMI. Unable to obtain vitals due to visit being conducted via telephonically.   Advanced Directives 05/10/2019 04/19/2018 04/15/2016 12/30/2015 12/30/2015 10/16/2015 08/13/2015  Does Patient Have a Medical Advance Directive? No No No No Yes No No  Type of Advance Directive - - - - Press photographer;Living will - -  Does patient want to make changes to medical advance directive? - Yes (MAU/Ambulatory/Procedural Areas - Information given) - - - - -  Would patient like information on creating a medical advance directive? No - Patient declined - No - patient declined information - - - -    Tobacco Social History   Tobacco Use  Smoking Status Never Smoker  Smokeless Tobacco Never Used     Counseling given: Not Answered   Clinical Intake:  Pre-visit preparation completed: Yes  Pain : No/denies pain Pain Score: 0-No pain     Nutritional Risks: None Diabetes: Yes  How often do you need to have someone help you when you read instructions, pamphlets, or other written materials from your doctor or pharmacy?: 1 - Never   Diabetes:  Is the patient diabetic?  Yes type 2 If diabetic, was a CBG obtained today?  No  Did the patient bring in their glucometer from  home?  No  How often do you monitor your CBG's? Twice daily.   Financial Strains and Diabetes Management:  Are you having any financial strains with the device, your supplies or your medication? No .  Does the patient want to be seen by Chronic Care Management for management of their diabetes?  No  Would the patient like to be referred to a Nutritionist or for Diabetic Management?  No   Diabetic Exams:  Diabetic Eye Exam: Completed 05/04/19. Repeat yearly.   Diabetic Foot Exam: Completed 04/30/19. Repeat yearly.    Interpreter Needed?: No  Information entered by :: [facility], LPN  Past Medical History:  Diagnosis Date  . Hyperlipidemia   . Hypertension    Past Surgical History:  Procedure Laterality Date  . APPENDECTOMY  1960's  . BREAST BIOPSY Right 02/28/2018   neg  . COLONOSCOPY WITH PROPOFOL N/A 04/15/2016   Procedure: COLONOSCOPY WITH PROPOFOL;  Surgeon: Manya Silvas, MD;  Location: [facility] ENDOSCOPY;  Service: Endoscopy;  Laterality: N/A;  . REFRACTIVE SURGERY    . TONSILLECTOMY AND ADENOIDECTOMY  1975   Family History  Problem Relation Age of Onset  . Cancer Mother        lung  . Thyroid disease Mother   . Cancer Father        prostate  . Heart disease Father   . Cancer Brother        prostate   Social History   Socioeconomic History  . Marital status: Divorced  Spouse name: Not on file  . Number of children: 2  . Years of education: Not on file  . Highest education level: Some college, no degree  Occupational History  . Occupation: retired  . Occupation: owns Engineer, building services  Social Needs  . Financial resource strain: Not hard at all  . Food insecurity    Worry: Never true    Inability: Never true  . Transportation needs    Medical: No    Non-medical: No  Tobacco Use  . Smoking status: Never Smoker  . Smokeless tobacco: Never Used  Substance and Sexual Activity  . Alcohol use: No    Alcohol/week: 0.0 standard drinks  . Drug use: No  .  Sexual activity: Not on file  Lifestyle  . Physical activity    Days per week: 0 days    Minutes per session: 0 min  . Stress: Not at all  Relationships  . Social Herbalist on phone: Patient refused    Gets together: Patient refused    Attends religious service: Patient refused    Active member of club or organization: Patient refused    Attends meetings of clubs or organizations: Patient refused    Relationship status: Patient refused  Other Topics Concern  . Not on file  Social History Narrative  . Not on file    Outpatient Encounter Medications as of 05/10/2019  Medication Sig  . acetaminophen (TYLENOL) 650 MG CR tablet Take 650 mg by mouth every 8 (eight) hours as needed for pain.  Marland Kitchen amLODipine (NORVASC) 5 MG tablet TAKE ONE TABLET BY MOUTH EVERY DAY  . atorvastatin (LIPITOR) 10 MG tablet TAKE ONE TABLET BY MOUTH EVERY DAY  . Bioflavonoid Products (BIOFLEX PO) Take by mouth daily.   Marland Kitchen glucose blood (ONETOUCH VERIO) test strip To check blood sugar once a day.  DX: E11.9  . lisinopril-hydrochlorothiazide (ZESTORETIC) 10-12.5 MG tablet TAKE ONE TABLET BY MOUTH EVERY DAY  . metFORMIN (GLUCOPHAGE-XR) 500 MG 24 hr tablet Take 1 tablet by mouth daily in the evening for 7 days then increase to  2 tablets by mouth daily in the evening  . Multiple Vitamin (MULTIVITAMIN) capsule Take 1 capsule by mouth daily.  Marland Kitchen omeprazole (PRILOSEC) 20 MG capsule TAKE 1 CAPSULE BY MOUTH EVERY DAY  . ONETOUCH DELICA LANCETS FINE MISC To check blood sugars once a day.  DX:  E11.9  . aspirin 81 MG tablet Take 1 tablet (81 mg total) by mouth daily.  . Probiotic Product (PROBIOTIC DAILY PO) Take by mouth.   No facility-administered encounter medications on file as of 05/10/2019.     Activities of Daily Living In your present state of health, do you have any difficulty performing the following activities: 05/10/2019  Hearing? N  Vision? N  Difficulty concentrating or making decisions? N   Walking or climbing stairs? N  Dressing or bathing? N  Doing errands, shopping? N  Preparing Food and eating ? N  Using the Toilet? N  In the past six months, have you accidently leaked urine? N  Do you have problems with loss of bowel control? N  Managing your Medications? N  Managing your Finances? N  Housekeeping or managing your Housekeeping? N  Some recent data might be hidden    Patient Care Team: Mar Daring, PA-C as PCP - General (Family Medicine) Thelma Comp, OD as Consulting Physician (Optometry) Ralene Bathe, MD (Dermatology) Gabriel Carina Betsey Holiday, MD as Physician Assistant (Endocrinology)  Assessment:   This is a routine wellness examination for [place].  Exercise Activities and Dietary recommendations Current Exercise Habits: The patient does not participate in regular exercise at present, Exercise limited by: None identified  Goals    . Exercise 3x per week (30 min per time)     Recommend to start walking 3 days a week for at least 30 minutes at a time.        Fall Risk: Fall Risk  05/10/2019 04/19/2018 12/30/2016 12/30/2015 10/16/2015  Falls in the past year? 0 No No No No    FALL RISK PREVENTION PERTAINING TO THE HOME:  Any stairs in or around the home? Yes  If so, are there any without handrails? No   Home free of loose throw rugs in walkways, pet beds, electrical cords, etc? Yes  Adequate lighting in your home to reduce risk of falls? Yes   ASSISTIVE DEVICES UTILIZED TO PREVENT FALLS:  Life alert? No  Use of a cane, walker or w/c? No  Grab bars in the bathroom? No  Shower chair or bench in shower? No  Elevated toilet seat or a handicapped toilet? Yes   TIMED UP AND GO:  Was the test performed? No .    Depression Screen PHQ 2/9 Scores 05/10/2019 04/19/2018 04/19/2018 12/30/2016  PHQ - 2 Score 0 0 0 0  PHQ- 9 Score - 1 - 0     Cognitive Function: Declined today.        Immunization History  Administered Date(s) Administered  .  Influenza, High Dose Seasonal PF 07/02/2018  . Pneumococcal Conjugate-13 08/13/2015  . Pneumococcal Polysaccharide-23 12/30/2016    Qualifies for Shingles Vaccine? Yes . Due for Shingrix. Education has been provided regarding the importance of this vaccine. Pt has been advised to call insurance company to determine out of pocket expense. Advised may also receive vaccine at local pharmacy or Health Dept. Verbalized acceptance and understanding.  Tdap: Although this vaccine is not a covered service during a Wellness Exam, does the patient still wish to receive this vaccine today?  No .   Flu Vaccine: Due for Flu vaccine. Does the patient want to receive this vaccine today?  No .   Pneumococcal Vaccine: Completed series  Screening Tests Health Maintenance  Topic Date Due  . TETANUS/TDAP  03/09/1970  . INFLUENZA VACCINE  04/14/2019  . HEMOGLOBIN A1C  10/31/2019  . MAMMOGRAM  04/07/2020  . FOOT EXAM  04/29/2020  . OPHTHALMOLOGY EXAM  05/03/2020  . DEXA SCAN  01/18/2021  . COLONOSCOPY  04/15/2021  . Hepatitis C Screening  Completed  . PNA vac Low Risk Adult  Completed    Cancer Screenings:  Colorectal Screening: Completed 04/15/16. Repeat every 5 years.  Mammogram: Completed 04/07/18.   Bone Density: Completed 01/19/16. Results reflect OSTEOPENIA. Repeat every 5 years.   Lung Cancer Screening: (Low Dose CT Chest recommended if Age 77-80 years, 30 pack-year currently smoking OR have quit w/in 15years.) does not qualify.   Additional Screening:  Hepatitis C Screening: Up to date  Dental Screening: Recommended annual dental exams for proper oral hygiene   Community Resource Referral:  CRR required this visit?  No       Plan:  I have personally reviewed and addressed the Medicare Annual Wellness questionnaire and have noted the following in the patient's chart:  A. Medical and social history B. Use of alcohol, tobacco or illicit drugs  C. Current medications and supplements D.  Functional ability and status E.  Nutritional status F.  Physical activity G. Advance directives H. List of other physicians I.  Hospitalizations, surgeries, and ER visits in previous 12 months J.  [place] such as hearing and vision if needed, cognitive and depression L. Referrals and appointments   In addition, I have reviewed and discussed with patient certain preventive protocols, quality metrics, and best practice recommendations. A written personalized care plan for preventive services as well as general preventive health recommendations were provided to patient.   Glendora Score, [place]  [id number] Nurse Health Advisor   Nurse Notes: Pt would like to receive the influenza vaccine at [facility] in office visit. Declined the tetanus vaccine.

## 2019-05-10 ENCOUNTER — Other Ambulatory Visit: Payer: Self-pay

## 2019-05-10 ENCOUNTER — Ambulatory Visit (INDEPENDENT_AMBULATORY_CARE_PROVIDER_SITE_OTHER): Payer: PPO

## 2019-05-10 DIAGNOSIS — Z0000 Encounter for general adult medical examination without abnormal findings: Secondary | ICD-10-CM | POA: Diagnosis not present

## 2019-05-10 NOTE — Patient Instructions (Signed)
Anne Parrish , Thank you for taking time to come for your Medicare Wellness Visit. I appreciate your ongoing commitment to your health goals. Please review the following plan we discussed and let me know if I can assist you in the future.   Screening recommendations/referrals: Colonoscopy: Up to date, due 04/2021 Mammogram: Up to date, due 03/2020 Bone Density: Up to date, due 01/2021 Recommended yearly ophthalmology/optometry visit for glaucoma screening and checkup Recommended yearly dental visit for hygiene and checkup  Vaccinations: Influenza vaccine: Currently due Pneumococcal vaccine: Completed series Tdap vaccine: Pt declines today.  Shingles vaccine: Pt declines today.     Advanced directives: Advance directive discussed with you today. Even though you declined this today please call our office should you change your mind and we can give you the proper paperwork for you to fill out.  Conditions/risks identified: Continue to work towards increasing exercise to 3 days a week for 30 minutes at a time.   Next appointment: 05/11/19 @ 9:00 AM with Fenton Malling.    Preventive Care 69 Years and Older, Female Preventive care refers to lifestyle choices and visits with your health care provider that can promote health and wellness. What does preventive care include?  A yearly physical exam. This is also called an annual well check.  Dental exams once or twice a year.  Routine eye exams. Ask your health care provider how often you should have your eyes checked.  Personal lifestyle choices, including:  Daily care of your teeth and gums.  Regular physical activity.  Eating a healthy diet.  Avoiding tobacco and drug use.  Limiting alcohol use.  Practicing safe sex.  Taking low-dose aspirin every day.  Taking vitamin and mineral supplements as recommended by your health care provider. What happens during an annual well check? The services and screenings done by your health  care provider during your annual well check will depend on your age, overall health, lifestyle risk factors, and family history of disease. Counseling  Your health care provider may ask you questions about your:  Alcohol use.  Tobacco use.  Drug use.  Emotional well-being.  Home and relationship well-being.  Sexual activity.  Eating habits.  History of falls.  Memory and ability to understand (cognition).  Work and work Statistician.  Reproductive health. Screening  You may have the following tests or measurements:  Height, weight, and BMI.  Blood pressure.  Lipid and cholesterol levels. These may be checked every 5 years, or more frequently if you are over 71 years old.  Skin check.  Lung cancer screening. You may have this screening every year starting at age 74 if you have a 30-pack-year history of smoking and currently smoke or have quit within the past 15 years.  Fecal occult blood test (FOBT) of the stool. You may have this test every year starting at age 35.  Flexible sigmoidoscopy or colonoscopy. You may have a sigmoidoscopy every 5 years or a colonoscopy every 10 years starting at age 34.  Hepatitis C blood test.  Hepatitis B blood test.  Sexually transmitted disease (STD) testing.  Diabetes screening. This is done by checking your blood sugar (glucose) after you have not eaten for a while (fasting). You may have this done every 1-3 years.  Bone density scan. This is done to screen for osteoporosis. You may have this done starting at age 28.  Mammogram. This may be done every 1-2 years. Talk to your health care provider about how often you should have  regular mammograms. Talk with your health care provider about your test results, treatment options, and if necessary, the need for more tests. Vaccines  Your health care provider may recommend certain vaccines, such as:  Influenza vaccine. This is recommended every year.  Tetanus, diphtheria, and  acellular pertussis (Tdap, Td) vaccine. You may need a Td booster every 10 years.  Zoster vaccine. You may need this after age 75.  Pneumococcal 13-valent conjugate (PCV13) vaccine. One dose is recommended after age 12.  Pneumococcal polysaccharide (PPSV23) vaccine. One dose is recommended after age 50. Talk to your health care provider about which screenings and vaccines you need and how often you need them. This information is not intended to replace advice given to you by your health care provider. Make sure you discuss any questions you have with your health care provider. Document Released: 09/26/2015 Document Revised: 05/19/2016 Document Reviewed: 07/01/2015 Elsevier Interactive Patient Education  2017 [place] Prevention in the Home Falls can cause injuries. They can happen to people of all ages. There are many things you can do to make your home safe and to help prevent falls. What can I do on the outside of my home?  Regularly fix the edges of walkways and driveways and fix any cracks.  Remove anything that might make you trip as you walk through a door, such as a raised step or threshold.  Trim any bushes or trees on the path to your home.  Use bright outdoor lighting.  Clear any walking paths of anything that might make someone trip, such as rocks or tools.  Regularly check to see if handrails are loose or broken. Make sure that both sides of any steps have handrails.  Any raised decks and porches should have guardrails on the edges.  Have any leaves, snow, or ice cleared regularly.  Use sand or salt on walking paths during winter.  Clean up any spills in your garage right away. This includes oil or grease spills. What can I do in the bathroom?  Use night lights.  Install grab bars by the toilet and in the tub and shower. Do not use towel bars as grab bars.  Use non-skid mats or decals in the tub or shower.  If you need to sit down in the shower, use  a plastic, non-slip stool.  Keep the floor dry. Clean up any water that spills on the floor as soon as it happens.  Remove soap buildup in the tub or shower regularly.  Attach bath mats securely with double-sided non-slip rug tape.  Do not have throw rugs and other things on the floor that can make you trip. What can I do in the bedroom?  Use night lights.  Make sure that you have a light by your bed that is easy to reach.  Do not use any sheets or blankets that are too big for your bed. They should not hang down onto the floor.  Have a firm chair that has side arms. You can use this for support while you get dressed.  Do not have throw rugs and other things on the floor that can make you trip. What can I do in the kitchen?  Clean up any spills right away.  Avoid walking on wet floors.  Keep items that you use a lot in easy-to-reach places.  If you need to reach something above you, use a strong step stool that has a grab bar.  Keep electrical cords out of the  way.  Do not use floor polish or wax that makes floors slippery. If you must use wax, use non-skid floor wax.  Do not have throw rugs and other things on the floor that can make you trip. What can I do with my stairs?  Do not leave any items on the stairs.  Make sure that there are handrails on both sides of the stairs and use them. Fix handrails that are broken or loose. Make sure that handrails are as long as the stairways.  Check any carpeting to make sure that it is firmly attached to the stairs. Fix any carpet that is loose or worn.  Avoid having throw rugs at the top or bottom of the stairs. If you do have throw rugs, attach them to the floor with carpet tape.  Make sure that you have a light switch at the top of the stairs and the bottom of the stairs. If you do not have them, ask someone to add them for you. What else can I do to help prevent falls?  Wear shoes that:  Do not have high heels.  Have  rubber bottoms.  Are comfortable and fit you well.  Are closed at the toe. Do not wear sandals.  If you use a stepladder:  Make sure that it is fully opened. Do not climb a closed stepladder.  Make sure that both sides of the stepladder are locked into place.  Ask someone to hold it for you, if possible.  Clearly mark and make sure that you can see:  Any grab bars or handrails.  First and last steps.  Where the edge of each step is.  Use tools that help you move around (mobility aids) if they are needed. These include:  Canes.  Walkers.  Scooters.  Crutches.  Turn on the lights when you go into a dark area. Replace any light bulbs as soon as they burn out.  Set up your furniture so you have a clear path. Avoid moving your furniture around.  If any of your floors are uneven, fix them.  If there are any pets around you, be aware of where they are.  Review your medicines with your doctor. Some medicines can make you feel dizzy. This can increase your chance of falling. Ask your doctor what other things that you can do to help prevent falls. This information is not intended to replace advice given to you by your health care provider. Make sure you discuss any questions you have with your health care provider. Document Released: 06/26/2009 Document Revised: 02/05/2016 Document Reviewed: 10/04/2014 Elsevier Interactive Patient Education  2017 [employer].

## 2019-05-11 ENCOUNTER — Ambulatory Visit: Payer: PPO

## 2019-05-11 ENCOUNTER — Ambulatory Visit (INDEPENDENT_AMBULATORY_CARE_PROVIDER_SITE_OTHER): Payer: PPO | Admitting: Physician Assistant

## 2019-05-11 ENCOUNTER — Encounter: Payer: Self-pay | Admitting: Physician Assistant

## 2019-05-11 ENCOUNTER — Other Ambulatory Visit: Payer: Self-pay

## 2019-05-11 VITALS — BP 125/78 | HR 67 | Temp 96.9°F | Resp 16 | Ht 63.0 in | Wt 167.6 lb

## 2019-05-11 DIAGNOSIS — Z1239 Encounter for other screening for malignant neoplasm of breast: Secondary | ICD-10-CM | POA: Diagnosis not present

## 2019-05-11 DIAGNOSIS — Z23 Encounter for immunization: Secondary | ICD-10-CM | POA: Diagnosis not present

## 2019-05-11 DIAGNOSIS — Z0000 Encounter for general adult medical examination without abnormal findings: Secondary | ICD-10-CM

## 2019-05-11 DIAGNOSIS — E1165 Type 2 diabetes mellitus with hyperglycemia: Secondary | ICD-10-CM

## 2019-05-11 DIAGNOSIS — I10 Essential (primary) hypertension: Secondary | ICD-10-CM

## 2019-05-11 DIAGNOSIS — E7800 Pure hypercholesterolemia, unspecified: Secondary | ICD-10-CM

## 2019-05-11 NOTE — Progress Notes (Signed)
Patient: Anne Parrish, Female    DOB: 1950-11-19, 68 y.o.   MRN: [id number] Visit Date: 05/11/2019  Today's Provider: Mar Daring, PA-C   Chief Complaint  Patient presents with  . Medicare Wellness   Subjective:     Complete Physical Anne Parrish is a 68 y.o. female. She feels well. She reports exercising no. She reports she is sleeping well.  05/10/2019 AWV with Mckenzie 04/07/2018 Mammogram-Bi-RADS 1 04/15/2016 colonoscopy-polyps -----------------------------------------------------------   Review of Systems  Constitutional: Negative.   HENT: Negative.   Eyes: Negative.   Respiratory: Negative.   Cardiovascular: Negative.   Gastrointestinal: Negative.   Endocrine: Negative.   Genitourinary: Negative.   Musculoskeletal: Negative.   Skin: Negative.   Allergic/Immunologic: Negative.   Neurological: Negative.   Hematological: Negative.   Psychiatric/Behavioral: Negative.     Social History   Socioeconomic History  . Marital status: Divorced    Spouse name: Not on file  . Number of children: 2  . Years of education: Not on file  . Highest education level: Some college, no degree  Occupational History  . Occupation: retired  . Occupation: owns Engineer, building services  Social Needs  . Financial resource strain: Not hard at all  . Food insecurity    Worry: Never true    Inability: Never true  . Transportation needs    Medical: No    Non-medical: No  Tobacco Use  . Smoking status: Never Smoker  . Smokeless tobacco: Never Used  Substance and Sexual Activity  . Alcohol use: No    Alcohol/week: 0.0 standard drinks  . Drug use: No  . Sexual activity: Not on file  Lifestyle  . Physical activity    Days per week: 0 days    Minutes per session: 0 min  . Stress: Not at all  Relationships  . Social Herbalist on phone: Patient refused    Gets together: Patient refused    Attends religious service: Patient refused    Active member of club  or organization: Patient refused    Attends meetings of clubs or organizations: Patient refused    Relationship status: Patient refused  . Intimate partner violence    Fear of current or ex partner: Patient refused    Emotionally abused: Patient refused    Physically abused: Patient refused    Forced sexual activity: Patient refused  Other Topics Concern  . Not on file  Social History Narrative  . Not on file    Past Medical History:  Diagnosis Date  . Hyperlipidemia   . Hypertension      Patient Active Problem List   Diagnosis Date Noted  . Colon polyp 04/23/2016  . Diabetes type 2, uncontrolled ([place]) 10/16/2015  . GERD (gastroesophageal reflux disease) 08/13/2015  . Allergic rhinitis 07/18/2015  . Goiter 07/18/2015  . BP (high blood pressure) 07/18/2015  . Hypercholesteremia 07/18/2015    Past Surgical History:  Procedure Laterality Date  . APPENDECTOMY  1960's  . BREAST BIOPSY Right 02/28/2018   neg  . COLONOSCOPY WITH PROPOFOL N/A 04/15/2016   Procedure: COLONOSCOPY WITH PROPOFOL;  Surgeon: Manya Silvas, MD;  Location[facility] END[facility]COPY;  Service: Endoscopy;  Laterality: N/A;  . REFRACTIVE SURGERY    . TONSILLECTOMY AND ADENOIDECTOMY  1975    Her family history includes Cancer in her brother, father, and mother; Heart disease in her father; Thyroid disease in her mother.   Current Outpatient Medications:  .  acetaminophen (TYLENOL) 650 MG CR tablet, Take 650 mg by mouth every 8 (eight) hours as needed for pain., Disp: , Rfl:  .  amLODipine (NORVASC) 5 MG tablet, TAKE ONE TABLET BY MOUTH EVERY DAY, Disp: 90 tablet, Rfl: 3 .  atorvastatin (LIPITOR) 10 MG tablet, TAKE ONE TABLET BY MOUTH EVERY DAY, Disp: 90 tablet, Rfl: 3 .  Bioflavonoid Products (BIOFLEX PO), Take by mouth daily. , Disp: , Rfl:  .  glucose blood (ONETOUCH VERIO) test strip, To check blood sugar once a day.  DX: E11.9, Disp: 100 each, Rfl: 3 .  lisinopril-hydrochlorothiazide (ZESTORETIC) 10-12.5 MG  tablet, TAKE ONE TABLET BY MOUTH EVERY DAY, Disp: 90 tablet, Rfl: 3 .  metFORMIN (GLUCOPHAGE-XR) 500 MG 24 hr tablet, Take 1 tablet by mouth daily in the evening for 7 days then increase to  2 tablets by mouth daily in the evening, Disp: , Rfl:  .  Multiple Vitamin (MULTIVITAMIN) capsule, Take 1 capsule by mouth daily., Disp: , Rfl:  .  omeprazole (PRILOSEC) 20 MG capsule, TAKE 1 CAPSULE BY MOUTH EVERY DAY, Disp: 90 capsule, Rfl: 3 .  ONETOUCH DELICA LANCETS FINE MISC, To check blood sugars once a day.  DX:  E11.9, Disp: 100 each, Rfl: 1  Patient Care Team: Mar Daring, PA-C as PCP - General (Family Medicine) Thelma Comp, OD as Consulting Physician (Optometry) Ralene Bathe, MD (Dermatology) Gabriel Carina Betsey Holiday, MD as Physician Assistant (Endocrinology)     Objective:    Vitals: BP 125/78 (BP Location: Left Arm, Patient Position: Sitting, Cuff Size: Normal)   Pulse 67   Temp (!) 96.9 F (36.1 C) (Temporal)   Resp 16   Ht 5\' 3"  (1.6 m)   Wt 167 lb 9.6 oz (76 kg)   SpO2 98%   BMI 29.69 kg/m   Physical Exam Vitals signs reviewed.  Constitutional:      General: She is not in acute distress.    Appearance: Normal appearance. She is well-developed. She is not ill-appearing or diaphoretic.  HENT:     Head: Normocephalic and atraumatic.     Right Ear: Tympanic membrane, ear canal and external ear normal.     Left Ear: Tympanic membrane, ear canal and external ear normal.     Nose: Nose normal.     Mouth/Throat:     Mouth: Mucous membranes are moist.     Pharynx: No oropharyngeal exudate.  Eyes:     General: No scleral icterus.       Right eye: No discharge.        Left eye: No discharge.     Extraocular Movements: Extraocular movements intact.     Conjunctiva/sclera: Conjunctivae normal.     Pupils: Pupils are equal, round, and reactive to light.  Neck:     Musculoskeletal: Normal range of motion and neck supple.     Thyroid: No thyromegaly.     Vascular: No  carotid bruit or JVD.     Trachea: No tracheal deviation.  Cardiovascular:     Rate and Rhythm: Normal rate and regular rhythm.     Pulses: Normal pulses.     Heart sounds: Normal heart sounds. No murmur. No friction rub. No gallop.   Pulmonary:     Effort: Pulmonary effort is normal. No respiratory distress.     Breath sounds: Normal breath sounds. No wheezing or rales.  Chest:     Chest wall: No tenderness.  Abdominal:     General: Abdomen is flat. Bowel  sounds are normal. There is no distension.     Palpations: Abdomen is soft. There is no mass.     Tenderness: There is no abdominal tenderness. There is no guarding or rebound.  Musculoskeletal: Normal range of motion.        General: No tenderness.     Right lower leg: No edema.  Lymphadenopathy:     Cervical: No cervical adenopathy.  Skin:    General: Skin is warm and dry.     Capillary Refill: Capillary refill takes less than 2 seconds.     Findings: No rash.  Neurological:     General: No focal deficit present.     Mental Status: She is alert and oriented to person, place, and time. Mental status is at baseline.  Psychiatric:        Mood and Affect: Mood normal.        Behavior: Behavior normal.        Thought Content: Thought content normal.        Judgment: Judgment normal.     Activities of Daily Living In your present state of health, do you have any difficulty performing the following activities: 05/10/2019  Hearing? N  Vision? N  Difficulty concentrating or making decisions? N  Walking or climbing stairs? N  Dressing or bathing? N  Doing errands, shopping? N  Preparing Food and eating ? N  Using the Toilet? N  In the past six months, have you accidently leaked urine? N  Do you have problems with loss of bowel control? N  Managing your Medications? N  Managing your Finances? N  Housekeeping or managing your Housekeeping? N  Some recent data might be hidden    Fall Risk Assessment Fall Risk  05/10/2019  04/19/2018 12/30/2016 12/30/2015 10/16/2015  Falls in the past year? 0 No No No No     Depression Screen PHQ 2/9 Scores 05/10/2019 04/19/2018 04/19/2018 12/30/2016  PHQ - 2 Score 0 0 0 0  PHQ- 9 Score - 1 - 0   Audit-C Alcohol Use Screening   Alcohol Use Disorder Test (AUDIT) 05/11/2019  1. How often do you have a drink containing alcohol? 0  2. How many drinks containing alcohol do you have on a typical day when you are drinking? 0  3. How often do you have six or more drinks on one occasion? 0  AUDIT-C Score 0    A score of 3 or more in women, and 4 or more in men indicates increased risk for alcohol abuse, EXCEPT if all of the points are from question 1  6CIT Screen 05/11/2019  What Year? 0 points  What month? 0 points  What time? 0 points  Count back from 20 0 points  Months in reverse 0 points  Repeat phrase 0 points  Total Score 0        Assessment & Plan:    Annual Physical Reviewed patient's Family Medical History Reviewed and updated list of patient's medical providers Assessment of cognitive impairment was done Assessed patient's functional ability Established a written schedule for health screening [place] Completed and Reviewed  Exercise Activities and Dietary recommendations Goals    . Exercise 3x per week (30 min per time)     Recommend to start walking 3 days a week for at least 30 minutes at a time.        Immunization History  Administered Date(s) Administered  . Influenza, High Dose Seasonal PF 07/02/2018  . Pneumococcal  Conjugate-13 08/13/2015  . Pneumococcal Polysaccharide-23 12/30/2016    Health Maintenance  Topic Date Due  . TETANUS/TDAP  03/09/1969  . INFLUENZA VACCINE  04/14/2019  . HEMOGLOBIN A1C  10/31/2019  . MAMMOGRAM  04/07/2020  . FOOT EXAM  04/29/2020  . OPHTHALMOLOGY EXAM  05/03/2020  . DEXA SCAN  01/18/2021  . COLONOSCOPY  04/15/2021  . Hepatitis C Screening  Completed  . PNA vac Low Risk Adult  Completed      Discussed health benefits of physical activity, and encouraged her to engage in regular exercise appropriate for her age and condition.    1. Annual physical exam Normal physical exam today. Will check labs as below and f/u pending lab results. If labs are stable and WNL she will not need to have these rechecked for one year at her next annual physical exam. She is to call the office in the meantime if she has any acute issue, questions or concerns. - CBC with Differential/Platelet - TSH  2. Breast cancer screening Breast exam today was normal. There is no family history of breast cancer. She does perform regular self breast exams. Mammogram was ordered as below. Information for [facility] clinic was given to patient so she may schedule her mammogram at her convenience. - MM 3D SCREEN BREAST BILATERAL; Future  3. Uncontrolled type 2 diabetes mellitus with hyperglycemia ([place]) Followed by Dr. Gabriel Carina. Most recent A1c was 7.1 and patient has been on Metformin 500mg  BID x 1 week.   5. Essential hypertension Stable. Continue amlodipine 5mg , lisinopril-hctz 10-12.5mg . Will check labs as below and f/u pending results. - CBC with Differential/Platelet - TSH  6. Hypercholesteremia Stable. Continue Atorvastatin 10mg . Will check labs as below and f/u pending results.  7. Need for influenza vaccination Flu vaccine given today without complication. Patient sat upright for 15 minutes to check for adverse reaction before being released. - Flu Vaccine QUAD High Dose(Fluad)  ------------------------------------------------------------------------------------------------------------    Mar Daring, PA-C  [facility]

## 2019-05-11 NOTE — Patient Instructions (Addendum)
Health Maintenance After Age 69 After age 69, you are at a higher risk for certain long-term diseases and infections as well as injuries from falls. Falls are a major cause of broken bones and head injuries in people who are older than age 69. Getting regular preventive care can help to keep you healthy and well. Preventive care includes getting regular testing and making lifestyle changes as recommended by your health care provider. Talk with your health care provider about:  Which screenings and tests you should have. A screening is a test that checks for a disease when you have no symptoms.  A diet and exercise plan that is right for you. What should I know about screenings and tests to prevent falls? Screening and testing are the best ways to find a health problem early. Early diagnosis and treatment give you the best chance of managing medical conditions that are common after age 69. Certain conditions and lifestyle choices may make you more likely to have a fall. Your health care provider may recommend:  Regular vision checks. Poor vision and conditions such as cataracts can make you more likely to have a fall. If you wear glasses, make sure to get your prescription updated if your vision changes.  Medicine review. Work with your health care provider to regularly review all of the medicines you are taking, including over-the-counter medicines. Ask your health care provider about any side effects that may make you more likely to have a fall. Tell your health care provider if any medicines that you take make you feel dizzy or sleepy.  Osteoporosis screening. Osteoporosis is a condition that causes the bones to get weaker. This can make the bones weak and cause them to break more easily.  Blood pressure screening. Blood pressure changes and medicines to control blood pressure can make you feel dizzy.  Strength and balance checks. Your health care provider may recommend certain tests to check your  strength and balance while standing, walking, or changing positions.  Foot health exam. Foot pain and numbness, as well as not wearing proper footwear, can make you more likely to have a fall.  Depression screening. You may be more likely to have a fall if you have a fear of falling, feel emotionally low, or feel unable to do activities that you used to do.  Alcohol use screening. Using too much alcohol can affect your balance and may make you more likely to have a fall. What actions can I take to lower my risk of falls? General instructions  Talk with your health care provider about your risks for falling. Tell your health care provider if: ? You fall. Be sure to tell your health care provider about all falls, even ones that seem minor. ? You feel dizzy, sleepy, or off-balance.  Take over-the-counter and prescription medicines only as told by your health care provider. These include any supplements.  Eat a healthy diet and maintain a healthy weight. A healthy diet includes low-fat dairy products, low-fat (lean) meats, and fiber from whole grains, beans, and lots of fruits and vegetables. Home safety  Remove any tripping hazards, such as rugs, cords, and clutter.  Install safety equipment such as grab bars in bathrooms and safety rails on stairs.  Keep rooms and walkways well-lit. Activity   Follow a regular exercise program to stay fit. This will help you maintain your balance. Ask your health care provider what types of exercise are appropriate for you.  If you need a cane or   walker, use it as recommended by your health care provider.  Wear supportive shoes that have nonskid soles. Lifestyle  Do not drink alcohol if your health care provider tells you not to drink.  If you drink alcohol, limit how much you have: ? 0-1 drink a day for women. ? 0-2 drinks a day for men.  Be aware of how much alcohol is in your drink. In the [place] one drink equals one typical bottle of beer (12  oz), one-half glass of wine (5 oz), or one shot of hard liquor (1 oz).  Do not use any products that contain nicotine or tobacco, such as cigarettes and e-cigarettes. If you need help quitting, ask your health care provider. Summary  Having a healthy lifestyle and getting preventive care can help to protect your health and wellness after age 39.  Screening and testing are the best way to find a health problem early and help you avoid having a fall. Early diagnosis and treatment give you the best chance for managing medical conditions that are more common for people who are older than age 6.  Falls are a major cause of broken bones and head injuries in people who are older than age 76. Take precautions to prevent a fall at home.  Work with your health care provider to learn what changes you can make to improve your health and wellness and to prevent falls. This information is not intended to replace advice given to you by your health care provider. Make sure you discuss any questions you have with your health care provider. Document Released: 07/13/2017 Document Revised: 12/21/2018 Document Reviewed: 07/13/2017 Elsevier Patient Education  2020 [place].  Invokana/Canagliflozin oral tablets What is this medicine? CANAGLIFLOZIN (KAN a gli FLOE zin) helps to treat type 2 diabetes. It helps to control blood sugar. Treatment is combined with diet and exercise. This drug may also reduce the risk of heart attack, stroke, or death if you have type 2 diabetes and risk factors for heart disease. If you have diabetic kidney disease with a certain amount of protein in the urine, this drug may reduce your risk of end stage kidney disease (ESKD), worsened kidney function, and hospitalization for heart failure. This medicine may be used for other purposes; ask your health care provider or pharmacist if you have questions. COMMON BRAND NAME(S): Invokana What should I tell my health care provider before I  take this medicine? They need to know if you have any of these conditions:  artery disease  dehydration  diabetic ketoacidosis  diet low in salt  eating less due to illness, surgery, dieting, or any other reason  foot sores  having surgery  high cholesterol  high levels of potassium in the blood  history of amputation  history of pancreatitis or pancreas problems  history of yeast infection of the penis or vagina  if you often drink alcohol  infections in the bladder, kidneys, or urinary tract  kidney disease  liver disease  low blood pressure  nerve damage  on hemodialysis  problems urinating  type 1 diabetes  uncircumcised female  an unusual or allergic reaction to canagliflozin, other medicines, foods, dyes, or preservatives  pregnant or trying to get pregnant  breast-feeding How should I use this medicine? Take this medicine by mouth with a glass of water. Follow the directions on the prescription label. Take it before the first meal of the day. Take your dose at the same time each day. Do not take more  often than directed. Do not stop taking except on your doctor's advice. A special MedGuide will be given to you by the pharmacist with each prescription and refill. Be sure to read this information carefully each time. Talk to your pediatrician regarding the use of this medicine in children. Special care may be needed. Overdosage: If you think you have taken too much of this medicine contact a poison control center or emergency room at once. NOTE: This medicine is only for you. Do not share this medicine with others. What if I miss a dose? If you miss a dose, take it as soon as you can. If it is almost time for your next dose, take only that dose. Do not take double or extra doses. What may interact with this medicine? Do not take this medicine with any of the following medications:  gatifloxacin This medicine may also interact with the following  medications:  alcohol  certain medicines for blood pressure, heart disease  digoxin  diuretics  insulin  nateglinide  phenobarbital  phenytoin  repaglinide  rifampin  ritonavir  sulfonylureas like glimepiride, glipizide, glyburide This list may not describe all possible interactions. Give your health care provider a list of all the medicines, herbs, non-prescription drugs, or dietary supplements you use. Also tell them if you smoke, drink alcohol, or use illegal drugs. Some items may interact with your medicine. What should I watch for while using this medicine? Visit your doctor or health care professional for regular checks on your progress. This medicine can cause a serious condition in which there is too much acid in the blood. If you develop nausea, vomiting, stomach pain, unusual tiredness, or breathing problems, stop taking this medicine and call your doctor right away. If possible, use a ketone dipstick to check for ketones in your urine. A test called the HbA1C (A1C) will be monitored. This is a simple blood test. It measures your blood sugar control over the last 2 to 3 months. You will receive this test every 3 to 6 months. Learn how to check your blood sugar. Learn the symptoms of low and high blood sugar and how to manage them. Always carry a quick-source of sugar with you in case you have symptoms of low blood sugar. Examples include hard sugar candy or glucose tablets. Make sure others know that you can choke if you eat or drink when you develop serious symptoms of low blood sugar, such as seizures or unconsciousness. They must get medical help at once. Tell your doctor or health care professional if you have high blood sugar. You might need to change the dose of your medicine. If you are sick or exercising more than usual, you might need to change the dose of your medicine. Do not skip meals. Ask your doctor or health care professional if you should avoid alcohol.  Many nonprescription cough and cold products contain sugar or alcohol. These can affect blood sugar. Wear a medical ID bracelet or chain, and carry a card that describes your disease and details of your medicine and dosage times. What side effects may I notice from receiving this medicine? Side effects that you should report to your doctor or health care professional as soon as possible:  allergic reactions like skin rash, itching or hives, swelling of the face, lips, or tongue  breathing problems  chest pain  dizziness  feeling faint or lightheaded, falls  muscle weakness  nausea, vomiting, unusual stomach upset or pain  new pain or tenderness, change  in skin color, sores or ulcers, or infection in legs or feet  penile discharge, itching, or pain in men  signs and symptoms of a genital infection, such as fever; tenderness, redness, or swelling in the genitals or area from the genitals to the back of the rectum  signs and symptoms of low blood sugar such as feeling anxious, confusion, dizziness, increased hunger, unusually weak or tired, sweating, shakiness, cold, irritable, headache, blurred vision, fast heartbeat, loss of consciousness  signs and symptoms of a urinary tract infection, such as fever, chills, a burning feeling when urinating, blood in the urine, back pain  tingling or numbness in the hands, legs, or feet  trouble passing urine or change in the amount of urine, including an urgent need to urinate more often, in larger amounts, or at night  unusual tiredness  vaginal discharge, itching, or odor in women Side effects that usually do not require medical attention (report to your doctor or health care professional if they continue or are bothersome):  mild increase in urination  thirsty This list may not describe all possible side effects. Call your doctor for medical advice about side effects. You may report side effects to FDA at 1-800-FDA-1088. Where should I  keep my medicine? Keep out of the reach of children. Store at room temperature between 20 and 25 degrees C (68 and 77 degrees F). Throw away any unused medicine after the expiration date. NOTE: This sheet is a summary. It may not cover all possible information. If you have questions about this medicine, talk to your doctor, pharmacist, or health care provider.  2020 Elsevier/Gold Standard (2018-06-15 09:42:33)

## 2019-05-14 DIAGNOSIS — I10 Essential (primary) hypertension: Secondary | ICD-10-CM | POA: Diagnosis not present

## 2019-05-14 DIAGNOSIS — Z0000 Encounter for general adult medical examination without abnormal findings: Secondary | ICD-10-CM | POA: Diagnosis not present

## 2019-05-14 DIAGNOSIS — L814 Other melanin hyperpigmentation: Secondary | ICD-10-CM | POA: Diagnosis not present

## 2019-05-14 DIAGNOSIS — D229 Melanocytic nevi, unspecified: Secondary | ICD-10-CM | POA: Diagnosis not present

## 2019-05-14 DIAGNOSIS — L578 Other skin changes due to chronic exposure to nonionizing radiation: Secondary | ICD-10-CM | POA: Diagnosis not present

## 2019-05-14 DIAGNOSIS — L920 Granuloma annulare: Secondary | ICD-10-CM | POA: Diagnosis not present

## 2019-05-14 DIAGNOSIS — L720 Epidermal cyst: Secondary | ICD-10-CM | POA: Diagnosis not present

## 2019-05-14 DIAGNOSIS — L821 Other seborrheic keratosis: Secondary | ICD-10-CM | POA: Diagnosis not present

## 2019-05-14 DIAGNOSIS — D2230 Melanocytic nevi of unspecified part of face: Secondary | ICD-10-CM | POA: Diagnosis not present

## 2019-05-14 DIAGNOSIS — D225 Melanocytic nevi of trunk: Secondary | ICD-10-CM | POA: Diagnosis not present

## 2019-05-14 DIAGNOSIS — L820 Inflamed seborrheic keratosis: Secondary | ICD-10-CM | POA: Diagnosis not present

## 2019-05-14 DIAGNOSIS — L570 Actinic keratosis: Secondary | ICD-10-CM | POA: Diagnosis not present

## 2019-05-14 DIAGNOSIS — D1800 Hemangioma unspecified site: Secondary | ICD-10-CM | POA: Diagnosis not present

## 2019-05-14 DIAGNOSIS — Z1283 Encounter for screening for malignant neoplasm of skin: Secondary | ICD-10-CM | POA: Diagnosis not present

## 2019-05-15 ENCOUNTER — Telehealth: Payer: Self-pay

## 2019-05-15 LAB — CBC WITH DIFFERENTIAL/PLATELET
Basophils Absolute: 0 10*3/uL (ref 0.0–0.2)
Basos: 1 %
EOS (ABSOLUTE): 0.3 10*3/uL (ref 0.0–0.4)
Eos: 7 %
Hematocrit: 35.2 % (ref 34.0–46.6)
Hemoglobin: 12.2 g/dL (ref 11.1–15.9)
Immature Grans (Abs): 0 10*3/uL (ref 0.0–0.1)
Immature Granulocytes: 0 %
Lymphocytes Absolute: 1.2 10*3/uL (ref 0.7–3.1)
Lymphs: 32 %
MCH: 29.3 pg (ref 26.6–33.0)
MCHC: 34.7 g/dL (ref 31.5–35.7)
MCV: 85 fL (ref 79–97)
Monocytes Absolute: 0.5 10*3/uL (ref 0.1–0.9)
Monocytes: 12 %
Neutrophils Absolute: 1.8 10*3/uL (ref 1.4–7.0)
Neutrophils: 48 %
Platelets: 269 10*3/uL (ref 150–450)
RBC: 4.16 x10E6/uL (ref 3.77–5.28)
RDW: 12.1 % (ref 11.7–15.4)
WBC: 3.7 10*3/uL (ref 3.4–10.8)

## 2019-05-15 LAB — TSH: TSH: 1.48 u[IU]/mL (ref 0.450–4.500)

## 2019-05-15 NOTE — Telephone Encounter (Signed)
Patient advised as directed below. 

## 2019-05-15 NOTE — Telephone Encounter (Signed)
-----   Message from Mar Daring, [place] sent at 05/15/2019  2:21 PM EDT ----- All labs are within normal limits and stable.  Thanks! -JB

## 2019-05-28 ENCOUNTER — Telehealth: Payer: Self-pay | Admitting: Physician Assistant

## 2019-05-28 NOTE — Telephone Encounter (Signed)
Patient was advised that she got the Influenza vaccine.

## 2019-05-28 NOTE — Telephone Encounter (Signed)
Pt called asking what kind of shot she got when she was in a couple weeks ago.  CB# [phone]   teri

## 2019-06-13 DIAGNOSIS — G5603 Carpal tunnel syndrome, bilateral upper limbs: Secondary | ICD-10-CM | POA: Diagnosis not present

## 2019-06-26 ENCOUNTER — Ambulatory Visit
Admission: RE | Admit: 2019-06-26 | Discharge: 2019-06-26 | Disposition: A | Discharge status: Home / Self Care | Payer: PPO | Source: Ambulatory Visit | Attending: Physician Assistant | Admitting: Physician Assistant

## 2019-06-26 DIAGNOSIS — Z1231 Encounter for screening mammogram for malignant neoplasm of breast: Secondary | ICD-10-CM | POA: Insufficient documentation

## 2019-06-26 DIAGNOSIS — Z1239 Encounter for other screening for malignant neoplasm of breast: Secondary | ICD-10-CM

## 2019-06-27 ENCOUNTER — Telehealth: Payer: Self-pay

## 2019-06-27 NOTE — Telephone Encounter (Signed)
-----   Message from Mar Daring, PA-C sent at 06/27/2019  9:29 AM EDT ----- Normal mammogram. Repeat screening in one year.

## 2019-06-27 NOTE — Telephone Encounter (Signed)
Viewed by [name] on 06/27/2019 9:29 AM Written by Mar Daring, PA-C on 06/27/2019 9:29 AM Normal mammogram. Repeat screening in one year.

## 2019-07-11 DIAGNOSIS — M79645 Pain in left finger(s): Secondary | ICD-10-CM | POA: Diagnosis not present

## 2019-07-11 DIAGNOSIS — R200 Anesthesia of skin: Secondary | ICD-10-CM | POA: Diagnosis not present

## 2019-07-11 DIAGNOSIS — M25531 Pain in right wrist: Secondary | ICD-10-CM | POA: Diagnosis not present

## 2019-07-11 DIAGNOSIS — L720 Epidermal cyst: Secondary | ICD-10-CM | POA: Diagnosis not present

## 2019-07-11 DIAGNOSIS — L570 Actinic keratosis: Secondary | ICD-10-CM | POA: Diagnosis not present

## 2019-07-11 DIAGNOSIS — L821 Other seborrheic keratosis: Secondary | ICD-10-CM | POA: Diagnosis not present

## 2019-07-11 DIAGNOSIS — M79644 Pain in right finger(s): Secondary | ICD-10-CM | POA: Diagnosis not present

## 2019-07-11 DIAGNOSIS — M25532 Pain in left wrist: Secondary | ICD-10-CM | POA: Diagnosis not present

## 2019-07-11 DIAGNOSIS — L578 Other skin changes due to chronic exposure to nonionizing radiation: Secondary | ICD-10-CM | POA: Diagnosis not present

## 2019-07-19 DIAGNOSIS — M25531 Pain in right wrist: Secondary | ICD-10-CM | POA: Insufficient documentation

## 2019-07-19 DIAGNOSIS — R200 Anesthesia of skin: Secondary | ICD-10-CM | POA: Insufficient documentation

## 2019-07-19 DIAGNOSIS — M79644 Pain in right finger(s): Secondary | ICD-10-CM | POA: Insufficient documentation

## 2019-08-03 DIAGNOSIS — E1165 Type 2 diabetes mellitus with hyperglycemia: Secondary | ICD-10-CM | POA: Diagnosis not present

## 2019-08-08 DIAGNOSIS — E119 Type 2 diabetes mellitus without complications: Secondary | ICD-10-CM | POA: Diagnosis not present

## 2019-09-30 IMAGING — MG MM DIGITAL DIAGNOSTIC BILAT W/ TOMO W/ CAD
8 series · 8 of 24 positions shown · non-contrast
Comparison: Previous exam(s).

CLINICAL DATA: History of benign RIGHT breast biopsy in Saturday February, 2017.Patient was recommended for a follow-up diagnostic mammogram in
6 months to ensure stability. Patient returns today for the
follow-up diagnostic exam.

EXAM:
DIGITAL DIAGNOSTIC BILATERAL MAMMOGRAM WITH CAD AND TOMO

[L MLO synth-2D]
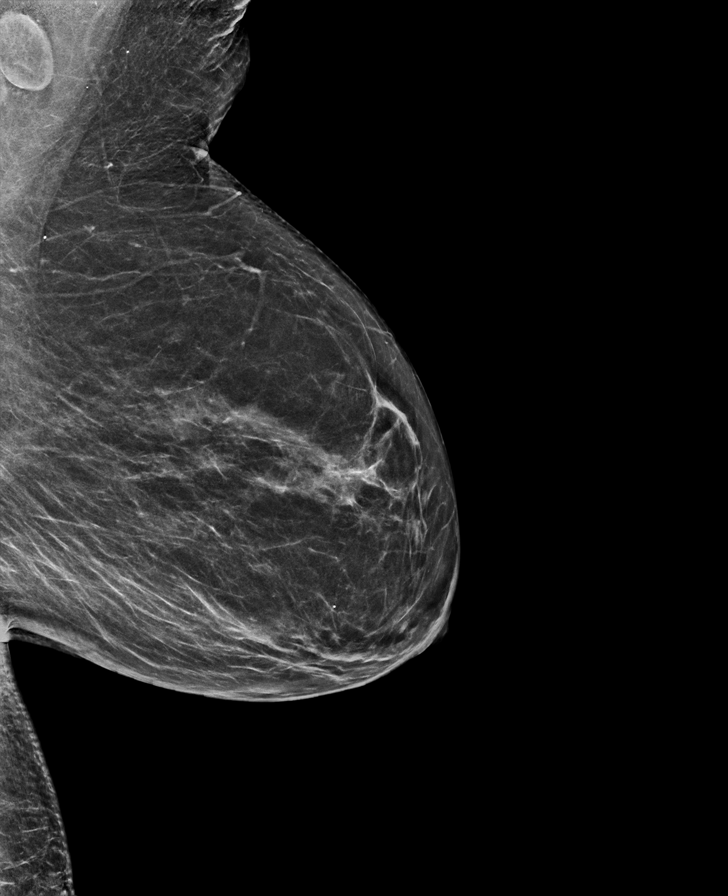

[L CC synth-2D]
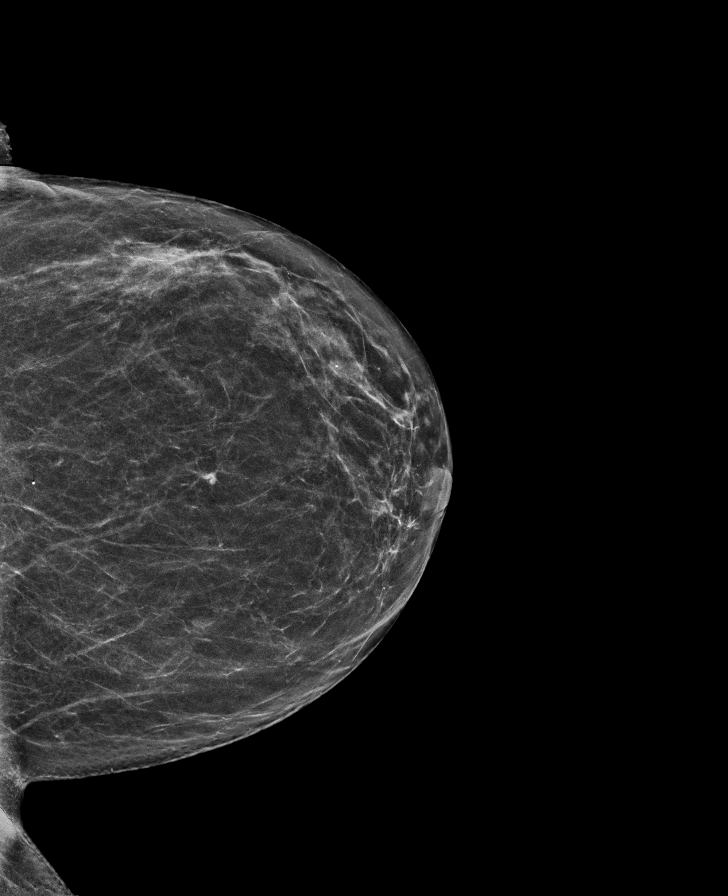

[R MLO synth-2D]
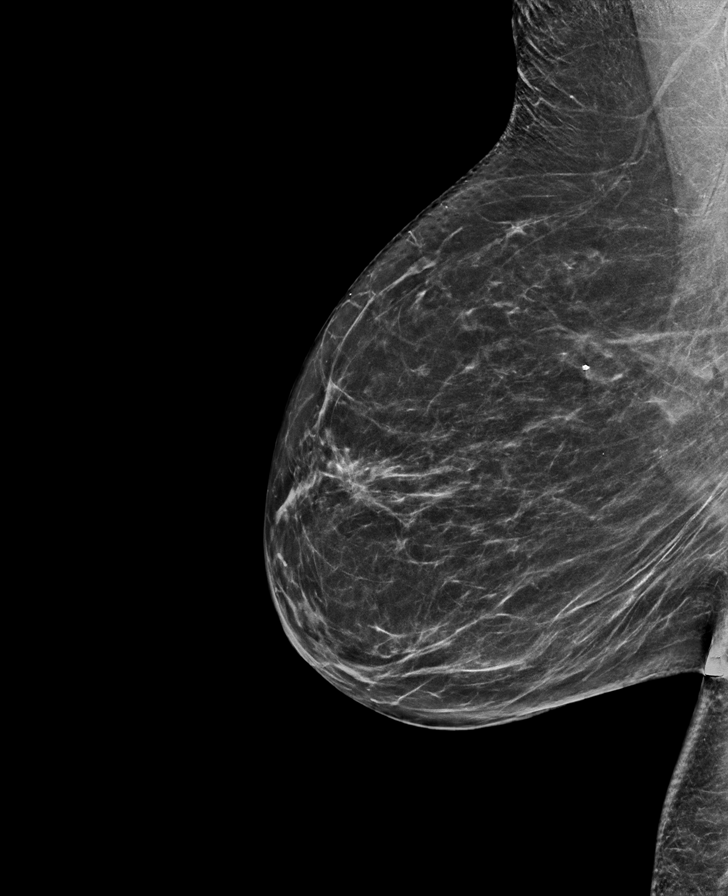

[R CC synth-2D]
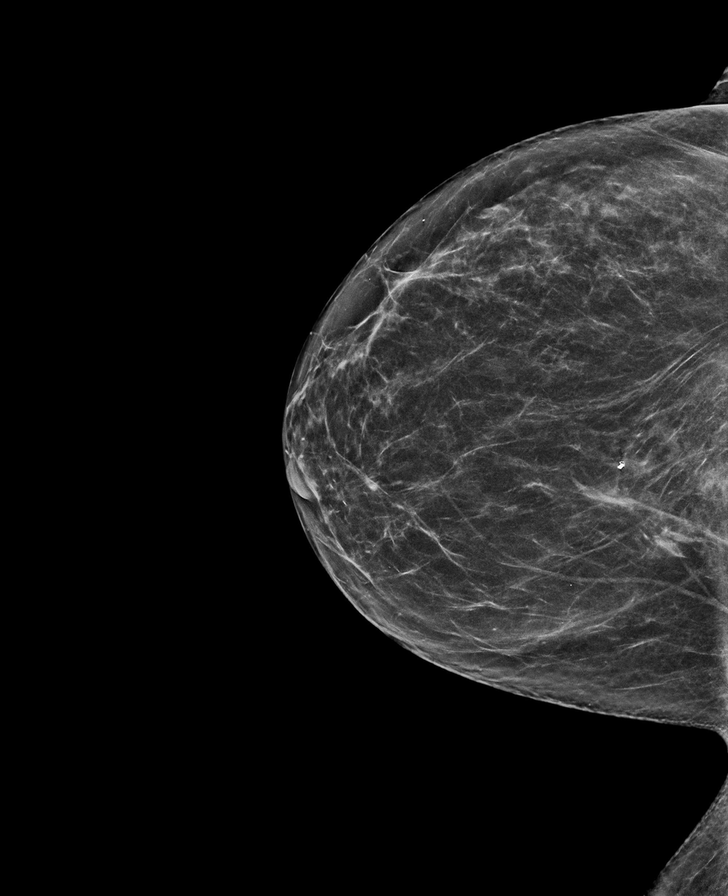

[R MLO tomo · tomo slice 33/65.0]
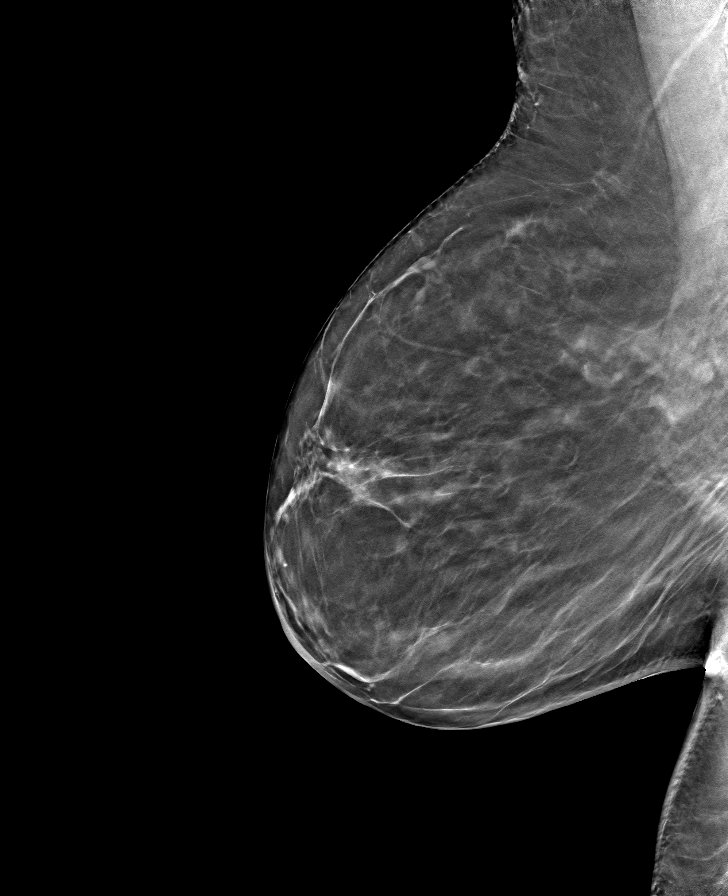

[L CC tomo · tomo slice 29/57.0]
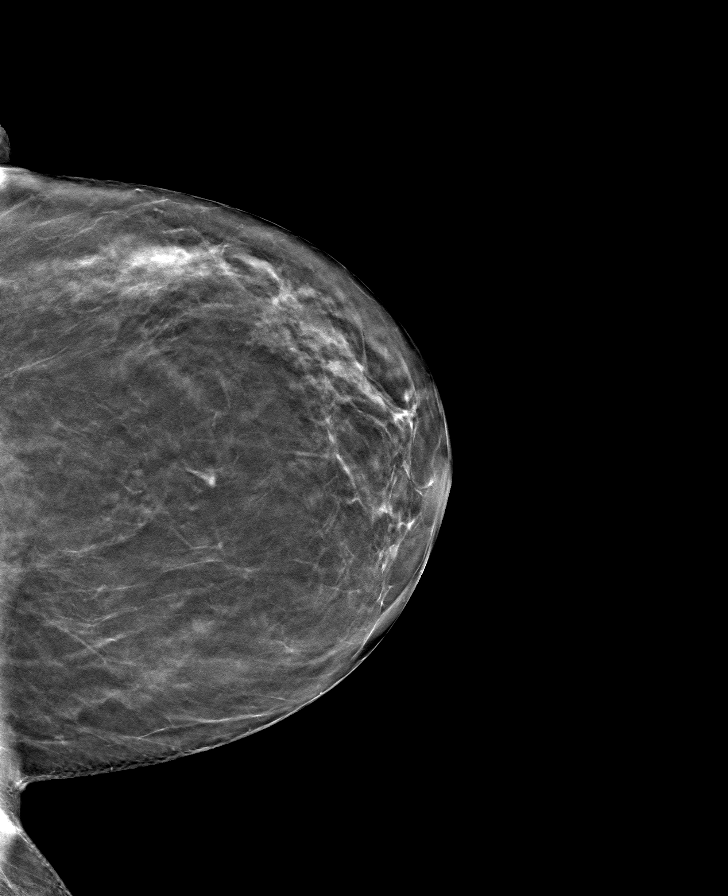

[R CC tomo · tomo slice 31/62.0]
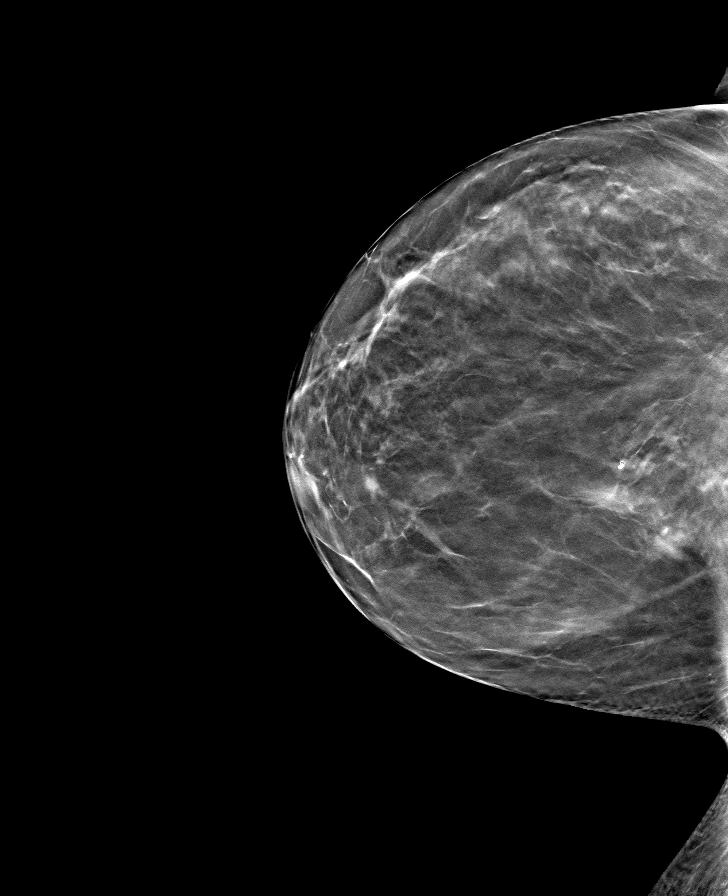

[L MLO tomo · tomo slice 33/64.0]
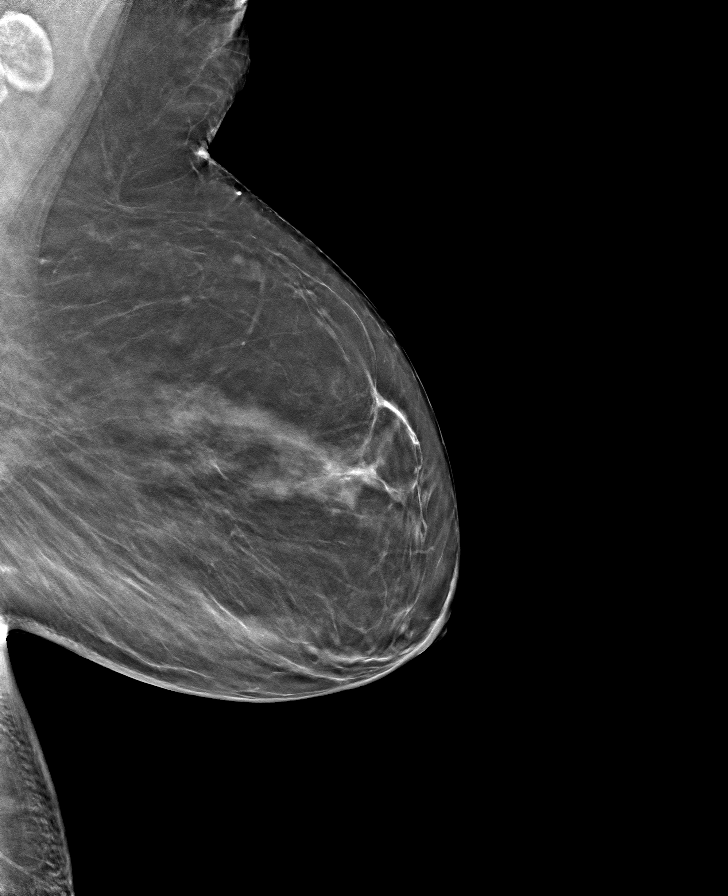

[8 of 24 positions shown; findings below may reference images not displayed]

ACR Breast Density Category b: There are scattered areas of
fibroglandular density.
FINDINGS: Biopsy site marker within the RIGHT breast is stable in position.
Overall fibroglandular pattern of the RIGHT breast is stable. There
are no new dominant masses, suspicious calcifications or secondary
signs of malignancy within either breast.

Mammographic images were processed with CAD.
IMPRESSION: No evidence of malignancy within either breast. Status post benign
RIGHT breast biopsy in Saturday February, 2017.

Patient may return to routine annual bilateral screening mammogram
schedule.

RECOMMENDATION:
Screening mammogram in one year.(Code:[id number])

I have discussed the findings and recommendations with the patient.
Results were also provided in writing at the conclusion of the
visit. If applicable, a reminder letter will be sent to the patient
regarding the next appointment.

BI-RADS CATEGORY  2: Benign.

## 2019-11-11 ENCOUNTER — Ambulatory Visit: Payer: PPO | Attending: Internal Medicine

## 2019-11-11 DIAGNOSIS — Z23 Encounter for immunization: Secondary | ICD-10-CM

## 2019-11-11 NOTE — Progress Notes (Signed)
   Covid-19 Vaccination Clinic  Name:  Anne Parrish    MRN: [id number] DOB: 1951-06-25  11/11/2019  [name] was observed post Covid-19 immunization for 15 minutes without incidence. She was provided with Vaccine Information Sheet and instruction to access the V-Safe system.   [name] was instructed to call 911 with any severe reactions post vaccine: Marland Kitchen Difficulty breathing  . Swelling of your face and throat  . A fast heartbeat  . A bad rash all over your body  . Dizziness and weakness    Immunizations Administered    Name Date Dose VIS Date Route   Pfizer COVID-19 Vaccine 11/11/2019 10:07 AM 0.3 mL 08/24/2019 Intramuscular   Manufacturer: [place]   Lot: [id number]   [place]: [id number]

## 2019-12-04 ENCOUNTER — Ambulatory Visit: Payer: PPO | Attending: Internal Medicine

## 2019-12-04 DIAGNOSIS — Z23 Encounter for immunization: Secondary | ICD-10-CM

## 2019-12-04 NOTE — Progress Notes (Signed)
   Covid-19 Vaccination Clinic  Name:  CHELSY CHOUDHURY    MRN: [id number] DOB: 04-04-1951  12/04/2019  Ms. Cuba was observed post Covid-19 immunization for 30 minutes based on pre-vaccination screening without incident. She was provided with Vaccine Information Sheet and instruction to access the V-Safe system.   Ms. Wayland was instructed to call 911 with any severe reactions post vaccine: Marland Kitchen Difficulty breathing  . Swelling of face and throat  . A fast heartbeat  . A bad rash all over body  . Dizziness and weakness   Immunizations Administered    Name Date Dose VIS Date Route   Pfizer COVID-19 Vaccine 12/04/2019 10:27 AM 0.3 mL 08/24/2019 Intramuscular   Manufacturer: [place]   Lot: [id number]   [place]: [id number]

## 2019-12-19 ENCOUNTER — Other Ambulatory Visit: Payer: Self-pay | Admitting: Physician Assistant

## 2019-12-19 DIAGNOSIS — I10 Essential (primary) hypertension: Secondary | ICD-10-CM

## 2020-03-03 ENCOUNTER — Other Ambulatory Visit: Payer: Self-pay | Admitting: Physician Assistant

## 2020-03-03 DIAGNOSIS — K219 Gastro-esophageal reflux disease without esophagitis: Secondary | ICD-10-CM

## 2020-03-18 ENCOUNTER — Other Ambulatory Visit: Payer: Self-pay | Admitting: Physician Assistant

## 2020-03-18 DIAGNOSIS — I10 Essential (primary) hypertension: Secondary | ICD-10-CM

## 2020-03-18 DIAGNOSIS — E7800 Pure hypercholesterolemia, unspecified: Secondary | ICD-10-CM

## 2020-03-18 NOTE — Telephone Encounter (Signed)
Requested medication (s) are due for refill today: Yes  Requested medication (s) are on the active medication list: Yes  Last refill:  03/09/19  Future visit scheduled: Yes  Notes to clinic:  Prescription has expired.    Requested Prescriptions  Pending Prescriptions Disp Refills   atorvastatin (LIPITOR) 10 MG tablet [Pharmacy Med Name: ATORVASTATIN CALCIUM 10 MG TAB] 90 tablet 3    Sig: TAKE ONE TABLET EVERY DAY      Cardiovascular:  Antilipid - Statins Failed - 03/18/2020  3:37 PM      Failed - Total Cholesterol in normal range and within 360 days    Cholesterol, Total  Date Value Ref Range Status  04/19/2018 140 100 - 199 mg/dL Final          Failed - LDL in normal range and within 360 days    LDL Calculated  Date Value Ref Range Status  04/19/2018 68 0 - 99 mg/dL Final          Failed - HDL in normal range and within 360 days    HDL  Date Value Ref Range Status  04/19/2018 51 >39 mg/dL Final          Failed - Triglycerides in normal range and within 360 days    Triglycerides  Date Value Ref Range Status  04/19/2018 106 0 - 149 mg/dL Final          Passed - Patient is not pregnant      Passed - Valid encounter within last 12 months    Recent Outpatient Visits           10 months ago Annual physical exam   [place], Clearnce Sorrel, [place]   1 year ago Essential hypertension   [place], Clearnce Sorrel, [place]   3 years ago [employer] annual wellness visit, subsequent   [place], Cesar Chavez, [place]   3 years ago Essential hypertension   [place], Clearnce Sorrel, [place]   4 years ago Medicare annual wellness visit, initial   [facility] Margarita Rana, [place]

## 2020-03-18 NOTE — Telephone Encounter (Signed)
Requested medication (s) are due for refill today: yes  Requested medication (s) are on the active medication list: yes  Last refill:  03/09/19 #90 3 refills  Future visit scheduled: no  Notes to clinic:  no valid encounter within 6 months . Attempted to call to set up appt. LVM to call back.     Requested Prescriptions  Pending Prescriptions Disp Refills   amLODipine (NORVASC) 5 MG tablet [Pharmacy Med Name: AMLODIPINE BESYLATE 5 MG TAB] 90 tablet 3    Sig: TAKE ONE TABLET EVERY DAY      Cardiovascular:  Calcium Channel Blockers Failed - 03/18/2020  3:45 PM      Failed - Valid encounter within last 6 months    Recent Outpatient Visits           10 months ago Annual physical exam   [facility] [place], Clearnce Sorrel, [place]   1 year ago Essential hypertension   [place], Clearnce Sorrel, [place]   3 years ago Medicare annual wellness visit, subsequent   [place], [place], [place]   3 years ago Essential hypertension   [place], Clearnce Sorrel, [place]   4 years ago Medicare annual wellness visit, initial   [place], [place]              Passed - Last BP in normal range    BP Readings from Last 1 Encounters:  05/11/19 125/78

## 2020-03-19 ENCOUNTER — Other Ambulatory Visit: Payer: Self-pay

## 2020-03-19 ENCOUNTER — Encounter: Payer: Self-pay | Admitting: Internal Medicine

## 2020-03-19 ENCOUNTER — Ambulatory Visit (INDEPENDENT_AMBULATORY_CARE_PROVIDER_SITE_OTHER): Payer: PPO | Admitting: Internal Medicine

## 2020-03-19 VITALS — BP 144/68 | HR 55 | Ht 65.0 in | Wt 187.5 lb

## 2020-03-19 DIAGNOSIS — E7800 Pure hypercholesterolemia, unspecified: Secondary | ICD-10-CM

## 2020-03-19 DIAGNOSIS — I10 Essential (primary) hypertension: Secondary | ICD-10-CM

## 2020-03-19 DIAGNOSIS — K219 Gastro-esophageal reflux disease without esophagitis: Secondary | ICD-10-CM | POA: Diagnosis not present

## 2020-03-19 DIAGNOSIS — K635 Polyp of colon: Secondary | ICD-10-CM

## 2020-03-19 DIAGNOSIS — E119 Type 2 diabetes mellitus without complications: Secondary | ICD-10-CM | POA: Diagnosis not present

## 2020-03-19 DIAGNOSIS — L309 Dermatitis, unspecified: Secondary | ICD-10-CM | POA: Insufficient documentation

## 2020-03-19 LAB — GLUCOSE, POCT (MANUAL RESULT ENTRY): POC Glucose: 173 mg/dl — AB (ref 70–99)

## 2020-03-19 MED ORDER — AMLODIPINE BESYLATE 5 MG PO TABS: 5.0000 mg | ORAL_TABLET | Freq: Every day | ORAL | 3 refills | Status: DC

## 2020-03-19 MED ORDER — ATORVASTATIN CALCIUM 10 MG PO TABS: 10.0000 mg | ORAL_TABLET | Freq: Every day | ORAL | 3 refills | Status: DC

## 2020-03-19 NOTE — Assessment & Plan Note (Signed)
We will repeat lipid panel last cholesterol was within normal limits.

## 2020-03-19 NOTE — Assessment & Plan Note (Signed)
Patient was referred to dermatologist.

## 2020-03-19 NOTE — Assessment & Plan Note (Signed)
hemoglobin AIC and a complete blood test. With physical  she was advised to walk daily.

## 2020-03-19 NOTE — Progress Notes (Signed)
New Patient Office Visit  Subjective:  Subjective  Patient ID: Anne Parrish, female    DOB: 1951/05/06  Age: 69 y.o. MRN: [id number]  CC:  Chief Complaint  Patient presents with  . New Patient (Initial Visit)    HPI Anne Parrish is a 69 y.o. female presenting today for an initial visit  She notes that she is just looking to change her primary over to a smaller facility.   The patient has a history of hyperlipidemia, hypertension, and diabetes mellitis, for which she is taking lisinopril-HCTZ and pioglitazone 15 mg. She notes that she has not been checking her blood sugar regularly at home.   She is due for a colonoscopy. Her last colonoscopy was completed on 04/15/2016, and a polyp was found. The doctor that performed her colonoscopy has since retired, so she will need to be referred out to a new gastroenterologist.   She is up to date with her mammograms. Her last mammography was on 06/26/2019, and there was no mammographic evidence of malignancy.   She sees Dr. Nehemiah Massed for her skin. She has multiple ring work lesions that are being followed by her.   Past Medical History:  Diagnosis Date  . Diabetes mellitus without complication ([place])   . Hyperlipidemia   . Hypertension     Past Surgical History:  Procedure Laterality Date  . APPENDECTOMY  1960's  . BREAST BIOPSY Right 02/28/2018    STROMAL FIBROSIS, 5 MM, AND LOBULAR INVOLUTION.  Marland Kitchen COLONOSCOPY WITH PROPOFOL N/A 04/15/2016   Procedure: COLONOSCOPY WITH PROPOFOL;  Surgeon: Manya Silvas, MD;  Location: [facility] ENDOSCOPY;  Service: Endoscopy;  Laterality: N/A;  . REFRACTIVE SURGERY    . TONSILLECTOMY AND ADENOIDECTOMY  1975    Family History  Problem Relation Age of Onset  . Cancer Mother        lung  . Thyroid disease Mother   . Cancer Father        prostate  . Heart disease Father   . Cancer Brother        prostate  . Breast cancer Neg Hx     Social History   Socioeconomic History  . Marital status:  Divorced    Spouse name: Not on file  . Number of children: 2  . Years of education: Not on file  . Highest education level: Some college, no degree  Occupational History  . Occupation: retired  . Occupation: owns Engineer, building services  Tobacco Use  . Smoking status: Never Smoker  . Smokeless tobacco: Never Used  Vaping Use  . Vaping Use: Never used  Substance and Sexual Activity  . Alcohol use: No    Alcohol/week: 0.0 standard drinks  . Drug use: No  . Sexual activity: Not on file  Other Topics Concern  . Not on file  Social History Narrative  . Not on file   Social Determinants of Health   Financial Resource Strain:   . Difficulty of Paying Living Expenses:   Food Insecurity:   . Worried About Charity fundraiser in the Last Year:   . Arboriculturist in the Last Year:   Transportation Needs:   . Film/video editor (Medical):   Marland Kitchen Lack of Transportation (Non-Medical):   Physical Activity: Inactive  . Days of Exercise per Week: 0 days  . Minutes of Exercise per Session: 0 min  Stress:   . Feeling of Stress :   Social Connections: Unknown  . Frequency of Communication  with Friends and Family: Patient refused  . Frequency of Social Gatherings with Friends and Family: Patient refused  . Attends Religious Services: Patient refused  . Active Member of Clubs or Organizations: Patient refused  . Attends Archivist Meetings: Patient refused  . Marital Status: Patient refused  Intimate Partner Violence: Unknown  . Fear of Current or Ex-Partner: Patient refused  . Emotionally Abused: Patient refused  . Physically Abused: Patient refused  . Sexually Abused: Patient refused     Current Outpatient Medications:  .  acetaminophen (TYLENOL) 650 MG CR tablet, Take 650 mg by mouth every 8 (eight) hours as needed for pain., Disp: , Rfl:  .  amLODipine (NORVASC) 5 MG tablet, Take 1 tablet (5 mg total) by mouth daily., Disp: 90 tablet, Rfl: 3 .  atorvastatin (LIPITOR) 10  MG tablet, Take 1 tablet (10 mg total) by mouth daily., Disp: 90 tablet, Rfl: 3 .  lisinopril-hydrochlorothiazide (ZESTORETIC) 10-12.5 MG tablet, TAKE ONE TABLET EVERY DAY, Disp: 90 tablet, Rfl: 3 .  Multiple Vitamin (MULTIVITAMIN) capsule, Take 1 capsule by mouth daily., Disp: , Rfl:  .  omeprazole (PRILOSEC) 20 MG capsule, TAKE 1 CAPSULE EVERY DAY, Disp: 90 capsule, Rfl: 0 .  ONETOUCH DELICA LANCETS FINE MISC, To check blood sugars once a day.  DX:  E11.9, Disp: 100 each, Rfl: 1 .  pioglitazone (ACTOS) 15 MG tablet, Take 15 mg by mouth daily., Disp: , Rfl:  .  Bioflavonoid Products (BIOFLEX PO), Take by mouth daily.  (Patient not taking: Reported on 03/19/2020), Disp: , Rfl:  .  glucose blood (ONETOUCH VERIO) test strip, To check blood sugar once a day.  DX: E11.9 (Patient not taking: Reported on 03/19/2020), Disp: 100 each, Rfl: 3 .  metFORMIN (GLUCOPHAGE-XR) 500 MG 24 hr tablet, Take 1 tablet by mouth daily in the evening for 7 days then increase to  2 tablets by mouth daily in the evening, Disp: , Rfl:    Allergies  Allergen Reactions  . Adhesive [Tape]   . Morphine And Related Itching  . Zolpidem   . Benzonatate Rash  . Levofloxacin Rash    ROS Review of Systems  Constitutional: Negative.   HENT: Positive for tinnitus (R ear).   Eyes: Negative.   Respiratory: Negative.  Negative for shortness of breath.   Cardiovascular: Negative.  Negative for chest pain and palpitations.  Gastrointestinal: Negative.   Endocrine: Negative.   Genitourinary: Negative.   Musculoskeletal: Negative.   Skin: Negative.   Allergic/Immunologic: Negative.   Neurological: Negative.   Hematological: Negative.   Psychiatric/Behavioral: Negative.   All other systems reviewed and are negative.     Objective:    Physical Exam Vitals reviewed.  Constitutional:      Appearance: Normal appearance.  HENT:     Mouth/Throat:     Mouth: Mucous membranes are moist.  Eyes:     Pupils: Pupils are equal,  round, and reactive to light.     Comments: ultracanthus of the right eye  Cardiovascular:     Rate and Rhythm: Normal rate and regular rhythm.     Pulses: Normal pulses.     Heart sounds: Normal heart sounds.  Pulmonary:     Effort: Pulmonary effort is normal.     Breath sounds: Normal breath sounds.  Abdominal:     Palpations: There is no hepatomegaly, splenomegaly or mass.     Tenderness: There is no abdominal tenderness.  Musculoskeletal:     Right lower leg: No edema.  Left lower leg: No edema.  Skin:    Findings: Erythema, lesion (ring worm) and rash present. Rash is scaling.     Comments: Ring worm on bilateral feet, bilateral legs, and back.   Neurological:     Mental Status: She is alert and oriented to person, place, and time.  Psychiatric:        Mood and Affect: Mood and affect normal.        Behavior: Behavior normal.     BP (!) 144/68   Pulse (!) 55   Ht 5\' 5"  (1.651 m)   Wt 187 lb 8 oz (85 kg)   BMI 31.20 kg/m  Wt Readings from Last 3 Encounters:  03/19/20 187 lb 8 oz (85 kg)  05/11/19 167 lb 9.6 oz (76 kg)  04/19/18 181 lb (82.1 kg)    Health Maintenance Due  Topic Date Due  . HEMOGLOBIN A1C  10/31/2019    There are no preventive care reminders to display for this patient.  Laboratory Data: I have reviewed this information for accuracy. CBC Latest Ref Rng & Units 05/14/2019 04/19/2018 11/12/2016  WBC 3.4 - 10.8 x10E3/uL 3.7 4.0 5.3  Hemoglobin 11.1 - 15.9 g/dL 12.2 12.0 12.9  Hematocrit 34.0 - 46.6 % 35.2 35.2 36.9  Platelets 150 - 450 x10E3/uL 269 293 296   CMP Latest Ref Rng & Units 04/19/2018 11/12/2016 03/23/2016  Glucose 65 - 99 mg/dL 121(H) 110(H) 184(H)  BUN 8 - 27 mg/dL 8 11 11   Creatinine 0.57 - 1.00 mg/dL 0.85 0.90 0.83  Sodium 134 - 144 mmol/L 135 142 126(L)  Potassium 3.5 - 5.2 mmol/L 4.2 4.5 2.9(L)  Chloride 96 - 106 mmol/L 98 102 92(L)  CO2 20 - 29 mmol/L 22 22 23   Calcium 8.7 - 10.3 mg/dL 9.8 9.4 9.4  Total Protein 6.0 - 8.5 g/dL  6.8 7.0 7.3  Total Bilirubin 0.0 - 1.2 mg/dL 0.8 0.7 1.8(H)  Alkaline Phos 39 - 117 IU/L 71 70 90  AST 0 - 40 IU/L 21 32 31  ALT 0 - 32 IU/L 14 28 26     Lab Results  Component Value Date   TSH 1.480 05/14/2019   Lab Results  Component Value Date   ALBUMIN 4.4 04/19/2018   ANIONGAP 11 03/23/2016   Lab Results  Component Value Date   CHOL 140 04/19/2018   CHOL 170 11/12/2016   CHOL 245 (H) 10/08/2015   HDL 51 04/19/2018   HDL 74 11/12/2016   HDL 61 10/08/2015   LDLCALC 68 04/19/2018   LDLCALC 84 11/12/2016   LDLCALC 154 (H) 10/08/2015   CHOLHDL 2.7 04/19/2018   CHOLHDL 2.3 11/12/2016   CHOLHDL 4.0 10/08/2015   Lab Results  Component Value Date   TRIG 106 04/19/2018   Lab Results  Component Value Date   HGBA1C 7.2 (H) 04/19/2018   HGBA1C 6.3 (H) 11/12/2016   HGBA1C 8.8 (H) 10/08/2015      Assessment & Plan:   Problem List Items Addressed This Visit      Cardiovascular and Mediastinum   BP (high blood pressure)    - Today, the patient's blood pressure is well managed on norvascand lisinopril. - The patient will continue the current treatment regimen.  - I encouraged the patient to eat a low-sodium diet to help control blood pressure. - I encouraged the patient to live an active lifestyle and complete activities that increases heart rate to 85% target heart rate at least 5 times per week for one  hour.          Relevant Medications   amLODipine (NORVASC) 5 MG tablet   atorvastatin (LIPITOR) 10 MG tablet     Digestive   GERD (gastroesophageal reflux disease)    Patient was advised not to eat anything 2 hours before she goes to sleep      Colon polyp    Need rept  Colon exam        Endocrine   Type 2 diabetes mellitus without complication, without long-term current use of insulin (H[facility]) - Primary     hemoglobin AIC and a complete blood test. With physical  she was advised to walk daily.      Relevant Medications   atorvastatin (LIPITOR) 10 MG  tablet   pioglitazone (ACTOS) 15 MG tablet   Other Relevant Orders   POCT glucose (manual entry) (Completed)     Musculoskeletal and Integument   Dermatitis    Patient was referred to dermatologist.        Other   Hypercholesteremia    We will repeat lipid panel last cholesterol was within normal limits.      Relevant Medications   amLODipine (NORVASC) 5 MG tablet   atorvastatin (LIPITOR) 10 MG tablet      Meds ordered this encounter  Medications  . amLODipine (NORVASC) 5 MG tablet    Sig: Take 1 tablet (5 mg total) by mouth daily.    Dispense:  90 tablet    Refill:  3    PT OUT OF REFILLS, THANK YOU  . atorvastatin (LIPITOR) 10 MG tablet    Sig: Take 1 tablet (10 mg total) by mouth daily.    Dispense:  90 tablet    Refill:  3    PT OUT OF REFILLS, THANK YOU  Type 2 diabetes mellitus without complication, without long-term current use of insulin (HCC) - Plan: POCT glucose (manual entry)  Essential hypertension - Plan: amLODipine (NORVASC) 5 MG tablet  Hypercholesteremia - Plan: atorvastatin (LIPITOR) 10 MG tablet  Polyp of sigmoid colon, unspecified type  Dermatitis  Gastroesophageal reflux disease without esophagitis  Follow-up: No follow-ups on file.    Dr. Jane Canary [facility] [address], [place], [place] [address]   By signing my name below, I, [employer], attest that this documentation has been prepared under the direction and in the presence of Cletis Athens, MD. Electronically Signed: Cletis Athens, MD 03/19/20, 12:55 PM   I personally performed the services described in this documentation, which was SCRIBED in my presence. The recorded information has been reviewed and considered accurate. It has been edited as necessary during review. Cletis Athens, MD

## 2020-03-19 NOTE — Assessment & Plan Note (Signed)
-   Today, the patient's blood pressure is well managed on norvascand lisinopril. - The patient will continue the current treatment regimen.  - I encouraged the patient to eat a low-sodium diet to help control blood pressure. - I encouraged the patient to live an active lifestyle and complete activities that increases heart rate to 85% target heart rate at least 5 times per week for one hour.

## 2020-03-19 NOTE — Assessment & Plan Note (Signed)
Need rept  Colon exam

## 2020-03-19 NOTE — Assessment & Plan Note (Signed)
Patient was advised not to eat anything 2 hours before she goes to sleep

## 2020-03-20 MED ORDER — LISINOPRIL-HYDROCHLOROTHIAZIDE 10-12.5 MG PO TABS: 1.0000 | ORAL_TABLET | Freq: Every day | ORAL | 3 refills | Status: DC

## 2020-03-20 NOTE — Addendum Note (Signed)
Addended by: Alois Cliche on: 03/20/2020 04:33 PM   Modules accepted: Orders

## 2020-03-28 NOTE — Addendum Note (Signed)
Addended by: Cletis Athens on: 03/28/2020 12:15 PM   Modules accepted: Level of Service

## 2020-04-11 ENCOUNTER — Other Ambulatory Visit: Payer: Self-pay

## 2020-04-11 ENCOUNTER — Ambulatory Visit (INDEPENDENT_AMBULATORY_CARE_PROVIDER_SITE_OTHER): Payer: PPO

## 2020-04-11 DIAGNOSIS — Z0000 Encounter for general adult medical examination without abnormal findings: Secondary | ICD-10-CM

## 2020-04-12 LAB — CBC WITH DIFFERENTIAL/PLATELET
Absolute Monocytes: 308 cells/uL (ref 200–950)
Basophils Absolute: 39 cells/uL (ref 0–200)
Basophils Relative: 1 %
Eosinophils Absolute: 211 cells/uL (ref 15–500)
Eosinophils Relative: 5.4 %
HCT: 36.2 % (ref 35.0–45.0)
Hemoglobin: 12.2 g/dL (ref 11.7–15.5)
Lymphs Abs: 1119 cells/uL (ref 850–3900)
MCH: 30.1 pg (ref 27.0–33.0)
MCHC: 33.7 g/dL (ref 32.0–36.0)
MCV: 89.4 fL (ref 80.0–100.0)
MPV: 9.5 fL (ref 7.5–12.5)
Monocytes Relative: 7.9 %
Neutro Abs: 2223 cells/uL (ref 1500–7800)
Neutrophils Relative %: 57 %
Platelets: 251 10*3/uL (ref 140–400)
RBC: 4.05 10*6/uL (ref 3.80–5.10)
RDW: 12.6 % (ref 11.0–15.0)
Total Lymphocyte: 28.7 %
WBC: 3.9 10*3/uL (ref 3.8–10.8)

## 2020-04-12 LAB — COMPLETE METABOLIC PANEL WITH GFR
AG Ratio: 1.7 (calc) (ref 1.0–2.5)
ALT: 13 U/L (ref 6–29)
AST: 22 U/L (ref 10–35)
Albumin: 4.3 g/dL (ref 3.6–5.1)
Alkaline phosphatase (APISO): 61 U/L (ref 37–153)
BUN/Creatinine Ratio: 17 (calc) (ref 6–22)
BUN: 16 mg/dL (ref 7–25)
CO2: 27 mmol/L (ref 20–32)
Calcium: 9.8 mg/dL (ref 8.6–10.4)
Chloride: 103 mmol/L (ref 98–110)
Creat: 0.96 mg/dL — ABNORMAL HIGH (ref 0.60–0.93)
GFR, Est African American: 69 mL/min/{1.73_m2} (ref >=60)
GFR, Est Non African American: 60 mL/min/{1.73_m2} (ref >=60)
Globulin: 2.6 g/dL (calc) (ref 1.9–3.7)
Glucose, Bld: 138 mg/dL — ABNORMAL HIGH (ref 65–99)
Potassium: 4.6 mmol/L (ref 3.5–5.3)
Sodium: 138 mmol/L (ref 135–146)
Total Bilirubin: 0.9 mg/dL (ref 0.2–1.2)
Total Protein: 6.9 g/dL (ref 6.1–8.1)

## 2020-04-12 LAB — TSH: TSH: 1.42 mIU/L (ref 0.40–4.50)

## 2020-04-18 ENCOUNTER — Other Ambulatory Visit: Payer: Self-pay

## 2020-04-18 ENCOUNTER — Ambulatory Visit (INDEPENDENT_AMBULATORY_CARE_PROVIDER_SITE_OTHER): Payer: PPO | Admitting: Internal Medicine

## 2020-04-18 ENCOUNTER — Encounter: Payer: Self-pay | Admitting: Internal Medicine

## 2020-04-18 VITALS — BP 152/79 | HR 67 | Ht 63.0 in | Wt 187.6 lb

## 2020-04-18 DIAGNOSIS — E119 Type 2 diabetes mellitus without complications: Secondary | ICD-10-CM | POA: Diagnosis not present

## 2020-04-18 DIAGNOSIS — Z0000 Encounter for general adult medical examination without abnormal findings: Secondary | ICD-10-CM

## 2020-04-18 DIAGNOSIS — I10 Essential (primary) hypertension: Secondary | ICD-10-CM

## 2020-04-18 DIAGNOSIS — L309 Dermatitis, unspecified: Secondary | ICD-10-CM | POA: Diagnosis not present

## 2020-04-18 DIAGNOSIS — K219 Gastro-esophageal reflux disease without esophagitis: Secondary | ICD-10-CM

## 2020-04-18 DIAGNOSIS — K635 Polyp of colon: Secondary | ICD-10-CM | POA: Diagnosis not present

## 2020-04-18 MED ORDER — PIOGLITAZONE HCL 15 MG PO TABS: 15.0000 mg | ORAL_TABLET | Freq: Every day | ORAL | 6 refills | Status: DC

## 2020-04-18 MED ORDER — METFORMIN HCL ER 500 MG PO TB24: ORAL_TABLET | ORAL | 6 refills | Status: DC

## 2020-04-18 NOTE — Assessment & Plan Note (Signed)
She has a ringworm type of lesion both legs and in the back.  I referred her back to dermatology for evaluation

## 2020-04-18 NOTE — Assessment & Plan Note (Signed)
Reflux diet discussed with the patient she was also advised to make lifestyle changes to lose weight

## 2020-04-18 NOTE — Assessment & Plan Note (Signed)
she was advised to take Metformin 500 mg p.o. twice a day

## 2020-04-18 NOTE — Assessment & Plan Note (Signed)
She needs a repeat colonoscopy.  We will schedule her for colonoscopy

## 2020-04-18 NOTE — Assessment & Plan Note (Signed)
Patient was scheduled to have a colonoscopy and mammogram.  Also advised to lose weight.  She was told not to eat anything 2 hours before going to sleep also found to have a goiter in the neck and was advised to see the endocrinologist.  She will be referred to dermatology for the skin rash

## 2020-04-18 NOTE — Progress Notes (Signed)
Established Patient Office Visit  SUBJECTIVE:  Subjective  Patient ID: Anne Parrish, female    DOB: 06/29/1951  Age: 69 y.o. MRN: [id number]  CC:  Chief Complaint  Patient presents with  . Annual Exam    HPI Anne Parrish is a 69 y.o. female presenting today for her annual exam.   She is taking her medication without issue and denies missed doses.   She still has red patches on her legs. She is followed by Dr. Nehemiah Massed for this.   She has had a recent eye exam. She has a mammogram scheduled for 06/25/2020.    Past Medical History:  Diagnosis Date  . Diabetes mellitus without complication ([place])   . Hyperlipidemia   . Hypertension     Past Surgical History:  Procedure Laterality Date  . APPENDECTOMY  1960's  . BREAST BIOPSY Right 02/28/2018    STROMAL FIBROSIS, 5 MM, AND LOBULAR INVOLUTION.  Marland Kitchen COLONOSCOPY WITH PROPOFOL N/A 04/15/2016   Procedure: COLONOSCOPY WITH PROPOFOL;  Surgeon: Manya Silvas, MD;  Location: [facility] ENDOSCOPY;  Service: Endoscopy;  Laterality: N/A;  . REFRACTIVE SURGERY    . TONSILLECTOMY AND ADENOIDECTOMY  1975    Family History  Problem Relation Age of Onset  . Cancer Mother        lung  . Thyroid disease Mother   . Cancer Father        prostate  . Heart disease Father   . Cancer Brother        prostate  . Breast cancer Neg Hx     Social History   Socioeconomic History  . Marital status: Divorced    Spouse name: Not on file  . Number of children: 2  . Years of education: Not on file  . Highest education level: Some college, no degree  Occupational History  . Occupation: retired  . Occupation: owns Engineer, building services  Tobacco Use  . Smoking status: Never Smoker  . Smokeless tobacco: Never Used  Vaping Use  . Vaping Use: Never used  Substance and Sexual Activity  . Alcohol use: No    Alcohol/week: 0.0 standard drinks  . Drug use: No  . Sexual activity: Not on file  Other Topics Concern  . Not on file  Social History  Narrative  . Not on file   Social Determinants of Health   Financial Resource Strain:   . Difficulty of Paying Living Expenses:   Food Insecurity:   . Worried About Charity fundraiser in the Last Year:   . Arboriculturist in the Last Year:   Transportation Needs:   . Film/video editor (Medical):   Marland Kitchen Lack of Transportation (Non-Medical):   Physical Activity: Inactive  . Days of Exercise per Week: 0 days  . Minutes of Exercise per Session: 0 min  Stress:   . Feeling of Stress :   Social Connections: Unknown  . Frequency of Communication with Friends and Family: Patient refused  . Frequency of Social Gatherings with Friends and Family: Patient refused  . Attends Religious Services: Patient refused  . Active Member of Clubs or Organizations: Patient refused  . Attends Archivist Meetings: Patient refused  . Marital Status: Patient refused  Intimate Partner Violence: Unknown  . Fear of Current or Ex-Partner: Patient refused  . Emotionally Abused: Patient refused  . Physically Abused: Patient refused  . Sexually Abused: Patient refused     Current Outpatient Medications:  .  acetaminophen (TYLENOL)  650 MG CR tablet, Take 650 mg by mouth every 8 (eight) hours as needed for pain., Disp: , Rfl:  .  amLODipine (NORVASC) 5 MG tablet, Take 1 tablet (5 mg total) by mouth daily., Disp: 90 tablet, Rfl: 3 .  atorvastatin (LIPITOR) 10 MG tablet, Take 1 tablet (10 mg total) by mouth daily., Disp: 90 tablet, Rfl: 3 .  Bioflavonoid Products (BIOFLEX PO), Take by mouth daily. , Disp: , Rfl:  .  glucose blood (ONETOUCH VERIO) test strip, To check blood sugar once a day.  DX: E11.9, Disp: 100 each, Rfl: 3 .  lisinopril-hydrochlorothiazide (ZESTORETIC) 10-12.5 MG tablet, Take 1 tablet by mouth daily., Disp: 90 tablet, Rfl: 3 .  metFORMIN (GLUCOPHAGE-XR) 500 MG 24 hr tablet, Take 1 tablet by mouth daily in the evening for 7 days then increase to  2 tablets by mouth daily in the  evening, Disp: 60 tablet, Rfl: 6 .  Multiple Vitamin (MULTIVITAMIN) capsule, Take 1 capsule by mouth daily., Disp: , Rfl:  .  omeprazole (PRILOSEC) 20 MG capsule, TAKE 1 CAPSULE EVERY DAY, Disp: 90 capsule, Rfl: 0 .  ONETOUCH DELICA LANCETS FINE MISC, To check blood sugars once a day.  DX:  E11.9, Disp: 100 each, Rfl: 1 .  pioglitazone (ACTOS) 15 MG tablet, Take 1 tablet (15 mg total) by mouth daily., Disp: 30 tablet, Rfl: 6   Allergies  Allergen Reactions  . Adhesive [Tape]   . Morphine And Related Itching  . Zolpidem   . Benzonatate Rash  . Levofloxacin Rash    ROS Review of Systems  Constitutional: Negative.   HENT: Negative.   Eyes: Negative.   Respiratory: Negative.  Negative for shortness of breath.   Cardiovascular: Negative.  Negative for chest pain.  Gastrointestinal: Negative.   Endocrine: Negative.   Genitourinary: Negative.   Musculoskeletal: Negative.   Skin: Negative.   Allergic/Immunologic: Negative.  Negative for environmental allergies.  Neurological: Negative.   Hematological: Negative.   Psychiatric/Behavioral: Negative.   All other systems reviewed and are negative.    OBJECTIVE:    Physical Exam Vitals reviewed.  Constitutional:      Appearance: Normal appearance.  HENT:     Mouth/Throat:     Mouth: Mucous membranes are moist.  Eyes:     Pupils: Pupils are equal, round, and reactive to light.  Neck:     Vascular: No carotid bruit.  Cardiovascular:     Rate and Rhythm: Normal rate and regular rhythm.     Pulses: Normal pulses.     Heart sounds: Normal heart sounds.  Pulmonary:     Effort: Pulmonary effort is normal.     Breath sounds: Normal breath sounds.  Abdominal:     General: Bowel sounds are normal.     Palpations: Abdomen is soft. There is no hepatomegaly, splenomegaly or mass.     Tenderness: There is no abdominal tenderness.     Hernia: No hernia is present.  Musculoskeletal:        General: No tenderness.     Cervical back:  Neck supple.     Right lower leg: No edema.     Left lower leg: No edema.     Comments: Dupuytren contracture in bilateral hands  Skin:    Findings: Rash present.  Neurological:     Mental Status: She is alert and oriented to person, place, and time.     Motor: No weakness.  Psychiatric:        Mood and Affect:  Mood and affect normal.        Behavior: Behavior normal.     BP (!) 152/79   Pulse 67   Ht 5\' 3"  (1.6 m)   Wt 187 lb 9.6 oz (85.1 kg)   BMI 33.23 kg/m  Wt Readings from Last 3 Encounters:  04/18/20 187 lb 9.6 oz (85.1 kg)  03/19/20 187 lb 8 oz (85 kg)  05/11/19 167 lb 9.6 oz (76 kg)    Health Maintenance Due  Topic Date Due  . HEMOGLOBIN A1C  10/31/2019  . INFLUENZA VACCINE  04/13/2020    There are no preventive care reminders to display for this patient.  CBC Latest Ref Rng & Units 04/11/2020 05/14/2019 04/19/2018  WBC 3.8 - 10.8 Thousand/uL 3.9 3.7 4.0  Hemoglobin 11.7 - 15.5 g/dL 12.2 12.2 12.0  Hematocrit 35 - 45 % 36.2 35.2 35.2  Platelets 140 - 400 Thousand/uL 251 269 293   CMP Latest Ref Rng & Units 04/11/2020 04/19/2018 11/12/2016  Glucose 65 - 99 mg/dL 138(H) 121(H) 110(H)  BUN 7 - 25 mg/dL 16 8 11   Creatinine 0.60 - 0.93 mg/dL 0.96(H) 0.85 0.90  Sodium 135 - 146 mmol/L 138 135 142  Potassium 3.5 - 5.3 mmol/L 4.6 4.2 4.5  Chloride 98 - 110 mmol/L 103 98 102  CO2 20 - 32 mmol/L 27 22 22   Calcium 8.6 - 10.4 mg/dL 9.8 9.8 9.4  Total Protein 6.1 - 8.1 g/dL 6.9 6.8 7.0  Total Bilirubin 0.2 - 1.2 mg/dL 0.9 0.8 0.7  Alkaline Phos 39 - 117 IU/L - 71 70  AST 10 - 35 U/L 22 21 32  ALT 6 - 29 U/L 13 14 28     Lab Results  Component Value Date   TSH 1.42 04/11/2020   Lab Results  Component Value Date   ALBUMIN 4.4 04/19/2018   ANIONGAP 11 03/23/2016   Lab Results  Component Value Date   CHOL 140 04/19/2018   CHOL 170 11/12/2016   CHOL 245 (H) 10/08/2015   HDL 51 04/19/2018   HDL 74 11/12/2016   HDL 61 10/08/2015   LDLCALC 68 04/19/2018   LDLCALC  84 11/12/2016   LDLCALC 154 (H) 10/08/2015   CHOLHDL 2.7 04/19/2018   CHOLHDL 2.3 11/12/2016   CHOLHDL 4.0 10/08/2015   Lab Results  Component Value Date   TRIG 106 04/19/2018   Lab Results  Component Value Date   HGBA1C 7.2 (H) 04/19/2018   HGBA1C 6.3 (H) 11/12/2016   HGBA1C 8.8 (H) 10/08/2015      ASSESSMENT & PLAN:   Problem List Items Addressed This Visit      Cardiovascular and Mediastinum   BP (high blood pressure)    Blood pressure is borderline high patient stated that she has been taking her lisinopril with hydrochlorothiazide.  Take amlodipine 5 mg p.o. daily.  Told her to lose weight.        Digestive   GERD (gastroesophageal reflux disease)    Reflux diet discussed with the patient she was also advised to make lifestyle changes to lose weight      Colon polyp    She needs a repeat colonoscopy.  We will schedule her for colonoscopy        Endocrine   Type 2 diabetes mellitus without complication, without long-term current use of insulin ([place]) - Primary    she was advised to take Metformin 500 mg p.o. daily      Relevant Medications   metFORMIN (GLUCOPHAGE-XR) 500  MG 24 hr tablet   pioglitazone (ACTOS) 15 MG tablet   Other Relevant Orders   POCT glucose (manual entry)     Musculoskeletal and Integument   Dermatitis    She has a ringworm type of lesion both legs and in the back.  I referred her back to dermatology for evaluation        Other   Annual physical exam    Patient was scheduled to have a colonoscopy and mammogram.  Also advised to lose weight.  She was told not to eat anything 2 hours before going to sleep also found to have a goiter in the neck and was advised to see the endocrinologist.  She will be referred to dermatology for the skin rash      Relevant Orders   Ambulatory referral to Gastroenterology      Meds ordered this encounter  Medications  . metFORMIN (GLUCOPHAGE-XR) 500 MG 24 hr tablet    Sig: Take 1 tablet by mouth  daily in the evening for 7 days then increase to  2 tablets by mouth daily in the evening    Dispense:  60 tablet    Refill:  6  . pioglitazone (ACTOS) 15 MG tablet    Sig: Take 1 tablet (15 mg total) by mouth daily.    Dispense:  30 tablet    Refill:  6     Follow-up: No follow-ups on file.    Dr. Jane Canary [facility] [address], [place], [place] [address]   By signing my name below, I, [employer], attest that this documentation has been prepared under the direction and in the presence of Cletis Athens, MD. Electronically Signed: Cletis Athens, MD 04/18/20, 5:34 PM    I personally performed the services described in this documentation, which was SCRIBED in my presence. The recorded information has been reviewed and considered accurate. It has been edited as necessary during review. Cletis Athens, MD

## 2020-04-18 NOTE — Assessment & Plan Note (Signed)
Blood pressure is borderline high patient stated that she has been taking her lisinopril with hydrochlorothiazide.  Take amlodipine 5 mg p.o. daily.  Told her to lose weight.

## 2020-04-30 ENCOUNTER — Other Ambulatory Visit: Payer: Self-pay | Admitting: Physician Assistant

## 2020-04-30 ENCOUNTER — Other Ambulatory Visit: Payer: Self-pay | Admitting: Internal Medicine

## 2020-04-30 DIAGNOSIS — Z1231 Encounter for screening mammogram for malignant neoplasm of breast: Secondary | ICD-10-CM

## 2020-05-12 ENCOUNTER — Telehealth (INDEPENDENT_AMBULATORY_CARE_PROVIDER_SITE_OTHER): Payer: Self-pay | Admitting: Gastroenterology

## 2020-05-12 ENCOUNTER — Encounter: Payer: Self-pay | Admitting: Physician Assistant

## 2020-05-12 ENCOUNTER — Encounter: Payer: Self-pay | Admitting: Gastroenterology

## 2020-05-12 ENCOUNTER — Other Ambulatory Visit: Payer: Self-pay

## 2020-05-12 DIAGNOSIS — K635 Polyp of colon: Secondary | ICD-10-CM

## 2020-05-12 MED ORDER — PEG 3350-KCL-NA BICARB-NACL 420 G PO SOLR
4000.0000 mL | Freq: Once | ORAL | 0 refills | Status: AC
Start: 2020-05-12 — End: 2020-05-12

## 2020-05-12 NOTE — Progress Notes (Signed)
Gastroenterology Pre-Procedure Review  Request Date: Tuesday 05/27/20 Requesting Physician: Dr. Allen Norris  PATIENT REVIEW QUESTIONS: The patient responded to the following health history questions as indicated:    1. Are you having any GI issues? no 2. Do you have a personal history of Polyps? yes (04/15/16 colon polyps noted with Dr. Vira Agar) 3. Do you have a family history of Colon Cancer or Polyps? no 4. Diabetes Mellitus? no 5. Joint replacements in the past 12 months?no 6. Major health problems in the past 3 months?no 7. Any artificial heart valves, MVP, or defibrillator?no    MEDICATIONS & ALLERGIES:    Patient reports the following regarding taking any anticoagulation/antiplatelet therapy:   Plavix, Coumadin, Eliquis, Xarelto, Lovenox, Pradaxa, Brilinta, or Effient? no Aspirin? no  Patient confirms/reports the following medications:  Current Outpatient Medications  Medication Sig Dispense Refill  . acetaminophen (TYLENOL) 650 MG CR tablet Take 650 mg by mouth every 8 (eight) hours as needed for pain.    Marland Kitchen amLODipine (NORVASC) 5 MG tablet Take 1 tablet (5 mg total) by mouth daily. 90 tablet 3  . atorvastatin (LIPITOR) 10 MG tablet Take 1 tablet (10 mg total) by mouth daily. 90 tablet 3  . Bioflavonoid Products (BIOFLEX PO) Take by mouth daily.     Marland Kitchen glucose blood (ONETOUCH VERIO) test strip To check blood sugar once a day.  DX: E11.9 100 each 3  . lisinopril-hydrochlorothiazide (ZESTORETIC) 10-12.5 MG tablet Take 1 tablet by mouth daily. 90 tablet 3  . metFORMIN (GLUCOPHAGE-XR) 500 MG 24 hr tablet Take 1 tablet by mouth daily in the evening for 7 days then increase to  2 tablets by mouth daily in the evening 60 tablet 6  . Multiple Vitamin (MULTIVITAMIN) capsule Take 1 capsule by mouth daily.    Marland Kitchen omeprazole (PRILOSEC) 20 MG capsule TAKE 1 CAPSULE EVERY DAY 90 capsule 0  . ONETOUCH DELICA LANCETS FINE MISC To check blood sugars once a day.  DX:  E11.9 100 each 1  . pioglitazone  (ACTOS) 15 MG tablet Take 1 tablet (15 mg total) by mouth daily. 30 tablet 6  . polyethylene glycol-electrolytes (NULYTELY) 420 g solution Take 4,000 mLs by mouth once for 1 dose. 4000 mL 0   No current facility-administered medications for this visit.    Patient confirms/reports the following allergies:  Allergies  Allergen Reactions  . Adhesive [Tape]   . Morphine And Related Itching  . Other Other (See Comments)  . Zolpidem   . Benzonatate Rash  . Levofloxacin Rash    No orders of the defined types were placed in this encounter.   AUTHORIZATION INFORMATION Primary Insurance: 1D#: Group #:  Secondary Insurance: 1D#: Group #:  SCHEDULE INFORMATION: Date: 05/27/20 Time: Location:[facility]

## 2020-05-21 ENCOUNTER — Telehealth: Payer: Self-pay

## 2020-05-21 ENCOUNTER — Other Ambulatory Visit: Payer: Self-pay

## 2020-05-21 MED ORDER — GOLYTELY 236 G PO SOLR: 4000.0000 mL | Freq: Once | ORAL | 0 refills | Status: AC

## 2020-05-21 NOTE — Telephone Encounter (Signed)
Patient has been made aware that there was an allergy contraindication with SuPrep bowel ingredient-Benzonatate.  This is noted to cause a rash.  Informed patient that I sent in a new rx [facility] Golytely [facility].  Thanks,  [place], [place]

## 2020-05-23 ENCOUNTER — Other Ambulatory Visit
Admission: RE | Admit: 2020-05-23 | Discharge: 2020-05-23 | Disposition: A | Discharge status: Home / Self Care | Payer: PPO | Source: Ambulatory Visit | Attending: Gastroenterology | Admitting: Gastroenterology

## 2020-05-23 ENCOUNTER — Other Ambulatory Visit: Payer: Self-pay

## 2020-05-23 DIAGNOSIS — Z20822 Contact with and (suspected) exposure to covid-19: Secondary | ICD-10-CM | POA: Diagnosis not present

## 2020-05-23 DIAGNOSIS — Z01812 Encounter for preprocedural laboratory examination: Secondary | ICD-10-CM | POA: Diagnosis not present

## 2020-05-23 LAB — SARS CORONAVIRUS 2 (TAT 6-24 HRS): SARS Coronavirus 2: NEGATIVE

## 2020-05-27 ENCOUNTER — Encounter: Payer: Self-pay | Admitting: Gastroenterology

## 2020-05-27 ENCOUNTER — Ambulatory Visit: Payer: PPO | Admitting: Anesthesiology

## 2020-05-27 ENCOUNTER — Encounter
Admission: RE | Disposition: A | Discharge status: Home / Self Care | Payer: Self-pay | Source: Home / Self Care | Attending: Gastroenterology

## 2020-05-27 ENCOUNTER — Other Ambulatory Visit: Payer: Self-pay

## 2020-05-27 ENCOUNTER — Ambulatory Visit
Admission: RE | Admit: 2020-05-27 | Discharge: 2020-05-27 | Disposition: A | Discharge status: Home / Self Care | Payer: PPO | Attending: Gastroenterology | Admitting: Gastroenterology

## 2020-05-27 DIAGNOSIS — I10 Essential (primary) hypertension: Secondary | ICD-10-CM | POA: Insufficient documentation

## 2020-05-27 DIAGNOSIS — K5730 Diverticulosis of large intestine without perforation or abscess without bleeding: Secondary | ICD-10-CM | POA: Insufficient documentation

## 2020-05-27 DIAGNOSIS — Z09 Encounter for follow-up examination after completed treatment for conditions other than malignant neoplasm: Secondary | ICD-10-CM | POA: Diagnosis not present

## 2020-05-27 DIAGNOSIS — Z79899 Other long term (current) drug therapy: Secondary | ICD-10-CM | POA: Diagnosis not present

## 2020-05-27 DIAGNOSIS — Z7984 Long term (current) use of oral hypoglycemic drugs: Secondary | ICD-10-CM | POA: Diagnosis not present

## 2020-05-27 DIAGNOSIS — E119 Type 2 diabetes mellitus without complications: Secondary | ICD-10-CM | POA: Diagnosis not present

## 2020-05-27 DIAGNOSIS — Z860100 Personal history of colon polyps, unspecified: Secondary | ICD-10-CM

## 2020-05-27 DIAGNOSIS — E785 Hyperlipidemia, unspecified: Secondary | ICD-10-CM | POA: Diagnosis not present

## 2020-05-27 DIAGNOSIS — K635 Polyp of colon: Secondary | ICD-10-CM

## 2020-05-27 DIAGNOSIS — D125 Benign neoplasm of sigmoid colon: Secondary | ICD-10-CM | POA: Diagnosis not present

## 2020-05-27 HISTORY — PX: COLONOSCOPY WITH PROPOFOL: SHX5780

## 2020-05-27 SURGERY — COLONOSCOPY WITH PROPOFOL
Anesthesia: General

## 2020-05-27 MED ORDER — PROPOFOL 10 MG/ML IV BOLUS
INTRAVENOUS | Status: DC | PRN
  Administered 2020-05-27: 100 mg via INTRAVENOUS

## 2020-05-27 MED ORDER — PROPOFOL 500 MG/50ML IV EMUL
INTRAVENOUS | Status: DC | PRN
  Administered 2020-05-27: 120 ug/kg/min via INTRAVENOUS

## 2020-05-27 MED ORDER — SODIUM CHLORIDE 0.9 % IV SOLN: INTRAVENOUS | Status: DC

## 2020-05-27 MED ORDER — LIDOCAINE HCL (CARDIAC) PF 100 MG/5ML IV SOSY
PREFILLED_SYRINGE | INTRAVENOUS | Status: DC | PRN
  Administered 2020-05-27: 50 mg via INTRAVENOUS

## 2020-05-27 NOTE — H&P (Signed)
Lucilla Lame, MD [facility] [address] [place], [place] [address] [phone] Fax : [phone]  Primary Care Physician:  Cletis Athens, MD Primary Gastroenterologist:  Dr. Allen Norris  Pre-Procedure History & Physical: HPI:  Anne Parrish is a 69 y.o. female is here for an colonoscopy.   Past Medical History:  Diagnosis Date  . Diabetes mellitus without complication ([place])   . Hyperlipidemia   . Hypertension     Past Surgical History:  Procedure Laterality Date  . APPENDECTOMY  1960's  . BREAST BIOPSY Right 02/28/2018    STROMAL FIBROSIS, 5 MM, AND LOBULAR INVOLUTION.  Marland Kitchen COLONOSCOPY WITH PROPOFOL N/A 04/15/2016   Procedure: COLONOSCOPY WITH PROPOFOL;  Surgeon: Manya Silvas, MD;  Location: [facility] ENDOSCOPY;  Service: Endoscopy;  Laterality: N/A;  . REFRACTIVE SURGERY    . TONSILLECTOMY AND ADENOIDECTOMY  1975    Prior to Admission medications   Medication Sig Start Date End Date Taking? Authorizing Provider  amLODipine (NORVASC) 5 MG tablet Take 1 tablet (5 mg total) by mouth daily. 03/19/20  Yes Masoud, Viann Shove, MD  atorvastatin (LIPITOR) 10 MG tablet Take 1 tablet (10 mg total) by mouth daily. 03/19/20  Yes Masoud, Viann Shove, MD  lisinopril-hydrochlorothiazide (ZESTORETIC) 10-12.5 MG tablet Take 1 tablet by mouth daily. 03/20/20  Yes Masoud, Viann Shove, MD  metFORMIN (GLUCOPHAGE-XR) 500 MG 24 hr tablet Take 1 tablet by mouth daily in the evening for 7 days then increase to  2 tablets by mouth daily in the evening 04/18/20  Yes Masoud, Viann Shove, MD  omeprazole (PRILOSEC) 20 MG capsule TAKE 1 CAPSULE EVERY DAY 03/03/20  Yes Fenton Malling M, PA-C  pioglitazone (ACTOS) 15 MG tablet Take 1 tablet (15 mg total) by mouth daily. 04/18/20  Yes Masoud, Viann Shove, MD  acetaminophen (TYLENOL) 650 MG CR tablet Take 650 mg by mouth every 8 (eight) hours as needed for pain.    [provider]  Bioflavonoid Products (BIOFLEX PO) Take by mouth daily.     [provider]  glucose blood  (ONETOUCH VERIO) test strip To check blood sugar once a day.  DX: E11.9 10/16/15   Margarita Rana, MD  Multiple Vitamin (MULTIVITAMIN) capsule Take 1 capsule by mouth daily.    [provider]  [facility] DELICA LANCETS FINE MISC To check blood sugars once a day.  DX:  E11.9 10/16/15   Margarita Rana, MD    Allergies as of 05/12/2020 - Review Complete 05/12/2020  Allergen Reaction Noted  . Adhesive [tape]  04/14/2016  . Morphine and related Itching 04/14/2016  . Other Other (See Comments) 04/14/2016  . Zolpidem  04/14/2016  . Benzonatate Rash 04/14/2016  . Levofloxacin Rash 04/14/2016    Family History  Problem Relation Age of Onset  . Cancer Mother        lung  . Thyroid disease Mother   . Cancer Father        prostate  . Heart disease Father   . Cancer Brother        prostate  . Breast cancer Neg Hx     Social History   Socioeconomic History  . Marital status: Divorced    Spouse name: Not on file  . Number of children: 2  . Years of education: Not on file  . Highest education level: Some college, no degree  Occupational History  . Occupation: retired  . Occupation: owns Engineer, building services  Tobacco Use  . Smoking status: Never Smoker  . Smokeless tobacco: Never Used  Vaping Use  .  Vaping Use: Never used  Substance and Sexual Activity  . Alcohol use: No    Alcohol/week: 0.0 standard drinks  . Drug use: No  . Sexual activity: Not on file  Other Topics Concern  . Not on file  Social History Narrative  . Not on file   Social Determinants of Health   Financial Resource Strain:   . Difficulty of Paying Living Expenses: Not on file  Food Insecurity:   . Worried About Charity fundraiser in the Last Year: Not on file  . Ran Out of Food in the Last Year: Not on file  Transportation Needs:   . Lack of Transportation (Medical): Not on file  . Lack of Transportation (Non-Medical): Not on file  Physical Activity:   . Days of Exercise per Week: Not on file  .  Minutes of Exercise per Session: Not on file  Stress:   . Feeling of Stress : Not on file  Social Connections:   . Frequency of Communication with Friends and Family: Not on file  . Frequency of Social Gatherings with Friends and Family: Not on file  . Attends Religious Services: Not on file  . Active Member of Clubs or Organizations: Not on file  . Attends Archivist Meetings: Not on file  . Marital Status: Not on file  Intimate Partner Violence:   . Fear of Current or Ex-Partner: Not on file  . Emotionally Abused: Not on file  . Physically Abused: Not on file  . Sexually Abused: Not on file    Review of Systems: See HPI, otherwise negative ROS  Physical Exam: BP 128/61   Pulse (!) 58   Temp (!) 96.7 F (35.9 C) (Tympanic)   Resp 18   Ht 5\' 3"  (1.6 m)   Wt 81.6 kg   SpO2 100%   BMI 31.89 kg/m  General:   Alert,  pleasant and cooperative in NAD Head:  Normocephalic and atraumatic. Neck:  Supple; no masses or thyromegaly. Lungs:  Clear throughout to auscultation.    Heart:  Regular rate and rhythm. Abdomen:  Soft, nontender and nondistended. Normal bowel sounds, without guarding, and without rebound.   Neurologic:  Alert and  oriented x4;  grossly normal neurologically.  Impression/Plan: Anne Parrish is here for an colonoscopy to be performed for a history of adenomatous polyps on 04/2016   Risks, benefits, limitations, and alternatives regarding  colonoscopy have been reviewed with the patient.  Questions have been answered.  All parties agreeable.   Lucilla Lame, MD  05/27/2020, 10:10 AM

## 2020-05-27 NOTE — Anesthesia Preprocedure Evaluation (Signed)
Anesthesia Evaluation  Patient identified by MRN, date of birth, ID band Patient awake    Reviewed: Allergy & Precautions, H&P , NPO status , Patient's Chart, lab work & pertinent test results  History of Anesthesia Complications Negative for: history of anesthetic complications  Airway Mallampati: II  TM Distance: >3 FB Neck ROM: full    Dental  (+) Teeth Intact   Pulmonary neg pulmonary ROS, neg sleep apnea, neg COPD,    breath sounds clear to auscultation       Cardiovascular hypertension, (-) angina(-) Past MI and (-) Cardiac Stents (-) dysrhythmias  Rhythm:regular Rate:Normal     Neuro/Psych negative neurological ROS  negative psych ROS   GI/Hepatic Neg liver ROS, GERD  Controlled,  Endo/Other  diabetes  Renal/GU negative Renal ROS  negative genitourinary   Musculoskeletal   Abdominal   Peds  Hematology negative hematology ROS (+)   Anesthesia Other Findings Past Medical History: No date: Diabetes mellitus without complication ([facility]) No date: Hyperlipidemia No date: Hypertension  Past Surgical History: 1960's: APPENDECTOMY 02/28/2018: BREAST BIOPSY; Right     Comment:   STROMAL FIBROSIS, 5 MM, AND LOBULAR INVOLUTION. 04/15/2016: COLONOSCOPY WITH PROPOFOL; N/A     Comment:  Procedure: COLONOSCOPY WITH PROPOFOL;  Surgeon: Manya Silvas, MD;  Location: [facility] ENDOSCOPY;  Service:               Endoscopy;  Laterality: N/A; No date: REFRACTIVE SURGERY 1975: TONSILLECTOMY AND ADENOIDECTOMY  BMI    Body Mass Index: 31.89 kg/m      Reproductive/Obstetrics negative OB ROS                             Anesthesia Physical Anesthesia Plan  ASA: II  Anesthesia Plan: General   Post-op Pain Management:    Induction:   PONV Risk Score and Plan: Propofol infusion and TIVA  Airway Management Planned: Nasal Cannula  Additional Equipment:   Intra-op Plan:    Post-operative Plan:   Informed Consent: I have reviewed the patients History and Physical, chart, labs and discussed the procedure including the risks, benefits and alternatives for the proposed anesthesia with the patient or authorized representative who has indicated his/her understanding and acceptance.     Dental Advisory Given  Plan Discussed with: Anesthesiologist, CRNA and Surgeon  Anesthesia Plan Comments:         Anesthesia Quick Evaluation

## 2020-05-27 NOTE — Op Note (Addendum)
[facility] Gastroenterology Patient Name: Anne Parrish Procedure Date: 05/27/2020 10:10 AM MRN: [id number] Account #: [id number] Date of Birth: [birth year] Admit Type: Outpatient Age: 69 Room: [facility] ENDO ROOM 1 Gender: Female Note Status: Finalized Procedure:             Colonoscopy Indications:           High risk colon cancer surveillance: Personal history                         of colonic polyps Providers:             Lucilla Lame MD, MD Referring MD:          Cletis Athens, MD (Referring MD) Medicines:             Propofol per Anesthesia Complications:         No immediate complications. Procedure:             Pre-Anesthesia Assessment:                        - Prior to the procedure, a History and Physical was                         performed, and patient medications and allergies were                         reviewed. The patient's tolerance of previous                         anesthesia was also reviewed. The risks and benefits                         of the procedure and the sedation options and risks                         were discussed with the patient. All questions were                         answered, and informed consent was obtained. Prior                         Anticoagulants: The patient has taken no previous                         anticoagulant or antiplatelet agents. ASA Grade                         Assessment: II - A patient with mild systemic disease.                         After reviewing the risks and benefits, the patient                         was deemed in satisfactory condition to undergo the                         procedure.  After obtaining informed consent, the colonoscope was                         passed under direct vision. Throughout the procedure,                         the patient's blood pressure, pulse, and oxygen                         saturations were monitored continuously. The                          Colonoscope was introduced through the anus and                         advanced to the the cecum, identified by appendiceal                         orifice and ileocecal valve. The colonoscopy was                         performed without difficulty. The patient tolerated                         the procedure well. The quality of the bowel                         preparation was excellent. Findings:      The perianal and digital rectal examinations were normal.      A few small-mouthed diverticula were found in the sigmoid colon. Impression:            - Diverticulosis in the sigmoid colon.                        - No specimens collected. Recommendation:        - Discharge patient to home.                        - Resume previous diet.                        - Continue present medications.                        - Repeat colonoscopy in 5 years for surveillance. Procedure Code(s):     --- Professional ---                        [phone], Colonoscopy, flexible; diagnostic, including                         collection of specimen(s) by brushing or washing, when                         performed (separate procedure) Diagnosis Code(s):     --- Professional ---                        Z86.010, Personal history of colonic polyps CPT copyright 2019 American Medical Association. All rights reserved. The codes documented  in this report are preliminary and upon coder review may  be revised to meet current compliance requirements. Lucilla Lame MD, MD 05/27/2020 10:42:44 AM This report has been signed electronically. Number of Addenda: 0 Note Initiated On: 05/27/2020 10:10 AM Scope Withdrawal Time: 0 hours 6 minutes 35 seconds  Total Procedure Duration: 0 hours 12 minutes 14 seconds  Estimated Blood Loss:  Estimated blood loss: none.      [facility]

## 2020-05-27 NOTE — Anesthesia Postprocedure Evaluation (Signed)
Anesthesia Post Note  Patient: Anne Parrish  Procedure(s) Performed: COLONOSCOPY WITH PROPOFOL (N/A )  Patient location during evaluation: PACU Anesthesia Type: General Level of consciousness: awake and alert Pain management: pain level controlled Vital Signs Assessment: post-procedure vital signs reviewed and stable Respiratory status: spontaneous breathing, nonlabored ventilation and respiratory function stable Cardiovascular status: blood pressure returned to baseline and stable Postop Assessment: no apparent nausea or vomiting Anesthetic complications: no   No complications documented.   Last Vitals:  Vitals:   05/27/20 1107 05/27/20 1117  BP: (!) 162/69 (!) 153/65  Pulse:    Resp:    Temp:    SpO2:      Last Pain:  Vitals:   05/27/20 1117  TempSrc:   PainSc: 0-No pain                 Brett Canales Mikaelyn Arthurs

## 2020-05-27 NOTE — Transfer of Care (Signed)
Immediate Anesthesia Transfer of Care Note  Patient: Anne Parrish  Procedure(s) Performed: COLONOSCOPY WITH PROPOFOL (N/A )  Patient Location: Endoscopy Unit  Anesthesia Type:General  Level of Consciousness: drowsy and patient cooperative  Airway & Oxygen Therapy: Patient Spontanous Breathing  Post-op Assessment: Report given to RN and Post -op Vital signs reviewed and stable  Post vital signs: Reviewed and stable  Last Vitals:  Vitals Value Taken Time  BP 145/68 05/27/20 1052  Temp 36 C 05/27/20 1047  Pulse 60 05/27/20 1052  Resp 16 05/27/20 1052  SpO2 98 % 05/27/20 1052  Vitals shown include unvalidated device data.  Last Pain:  Vitals:   05/27/20 1047  TempSrc: Temporal  PainSc: 0-No pain         Complications: No complications documented.

## 2020-06-09 ENCOUNTER — Telehealth: Payer: Self-pay

## 2020-06-09 ENCOUNTER — Other Ambulatory Visit: Payer: Self-pay | Admitting: *Deleted

## 2020-06-09 DIAGNOSIS — K219 Gastro-esophageal reflux disease without esophagitis: Secondary | ICD-10-CM

## 2020-06-09 MED ORDER — OMEPRAZOLE 20 MG PO CPDR: DELAYED_RELEASE_CAPSULE | ORAL | 3 refills | Status: DC

## 2020-06-09 NOTE — Telephone Encounter (Signed)
[facility]eds [facility]

## 2020-06-17 DIAGNOSIS — H40013 Open angle with borderline findings, low risk, bilateral: Secondary | ICD-10-CM | POA: Diagnosis not present

## 2020-06-17 DIAGNOSIS — E119 Type 2 diabetes mellitus without complications: Secondary | ICD-10-CM | POA: Diagnosis not present

## 2020-06-17 DIAGNOSIS — H5213 Myopia, bilateral: Secondary | ICD-10-CM | POA: Diagnosis not present

## 2020-06-17 DIAGNOSIS — H524 Presbyopia: Secondary | ICD-10-CM | POA: Diagnosis not present

## 2020-06-17 DIAGNOSIS — H21233 Degeneration of iris (pigmentary), bilateral: Secondary | ICD-10-CM | POA: Diagnosis not present

## 2020-06-26 ENCOUNTER — Ambulatory Visit
Admission: RE | Admit: 2020-06-26 | Discharge: 2020-06-26 | Disposition: A | Discharge status: Home / Self Care | Payer: PPO | Source: Ambulatory Visit | Attending: Internal Medicine | Admitting: Internal Medicine

## 2020-06-26 ENCOUNTER — Other Ambulatory Visit: Payer: Self-pay

## 2020-06-26 DIAGNOSIS — Z1231 Encounter for screening mammogram for malignant neoplasm of breast: Secondary | ICD-10-CM

## 2020-07-17 ENCOUNTER — Ambulatory Visit: Payer: PPO | Admitting: Dermatology

## 2020-07-17 ENCOUNTER — Other Ambulatory Visit: Payer: Self-pay

## 2020-07-17 DIAGNOSIS — D485 Neoplasm of uncertain behavior of skin: Secondary | ICD-10-CM

## 2020-07-17 DIAGNOSIS — L814 Other melanin hyperpigmentation: Secondary | ICD-10-CM

## 2020-07-17 DIAGNOSIS — L820 Inflamed seborrheic keratosis: Secondary | ICD-10-CM

## 2020-07-17 DIAGNOSIS — C4491 Basal cell carcinoma of skin, unspecified: Secondary | ICD-10-CM

## 2020-07-17 DIAGNOSIS — C441122 Basal cell carcinoma of skin of right lower eyelid, including canthus: Secondary | ICD-10-CM

## 2020-07-17 DIAGNOSIS — L821 Other seborrheic keratosis: Secondary | ICD-10-CM | POA: Diagnosis not present

## 2020-07-17 DIAGNOSIS — Z1283 Encounter for screening for malignant neoplasm of skin: Secondary | ICD-10-CM | POA: Diagnosis not present

## 2020-07-17 DIAGNOSIS — L578 Other skin changes due to chronic exposure to nonionizing radiation: Secondary | ICD-10-CM

## 2020-07-17 DIAGNOSIS — L920 Granuloma annulare: Secondary | ICD-10-CM | POA: Diagnosis not present

## 2020-07-17 DIAGNOSIS — D1800 Hemangioma unspecified site: Secondary | ICD-10-CM | POA: Diagnosis not present

## 2020-07-17 DIAGNOSIS — D229 Melanocytic nevi, unspecified: Secondary | ICD-10-CM | POA: Diagnosis not present

## 2020-07-17 HISTORY — DX: Basal cell carcinoma of skin, unspecified: C44.91

## 2020-07-17 NOTE — Patient Instructions (Signed)

## 2020-07-17 NOTE — Progress Notes (Signed)
Follow-Up Visit   Subjective  Anne Parrish is a 69 y.o. female who presents for the following: Annual Exam (Patient has a growing lesion on the R lat canthus she would like checked, and lesions in her scalp). The patient presents for Total-Body Skin Exam (TBSE) for skin cancer screening and mole check.  The following portions of the chart were reviewed this encounter and updated as appropriate:  Tobacco  Allergies  Meds  Problems  Med Hx  Surg Hx  Fam Hx     Review of Systems:  No other skin or systemic complaints except as noted in HPI or Assessment and Plan.  Objective  Well appearing patient in no apparent distress; mood and affect are within normal limits.  A full examination was performed including scalp, head, eyes, ears, nose, lips, neck, chest, axillae, abdomen, back, buttocks, bilateral upper extremities, bilateral lower extremities, hands, feet, fingers, toes, fingernails, and toenails. All findings within normal limits unless otherwise noted below.  Objective  Right Lateral Canthus/lower eyelid: 0.7 cm pink papule  Objective  Scalp x 3, right zygoma x 1 (4): Erythematous keratotic or waxy stuck-on papule or plaque.   Objective  Back, legs: Annular pink plaques  Assessment & Plan  Neoplasm of uncertain behavior of skin Right Lateral Canthus/lower eyelid  Skin excision  Lesion length (cm):  0.6 Lesion width (cm):  0.6 Margin per side (cm):  0.2 Total excision diameter (cm):  1 Informed consent: discussed and consent obtained   Timeout: patient name, date of birth, surgical site, and procedure verified   Procedure prep:  Patient was prepped and draped in usual sterile fashion Prep type:  Isopropyl alcohol and povidone-iodine Anesthesia: the lesion was anesthetized in a standard fashion   Anesthetic:  1% lidocaine w/ epinephrine 1-100,000 buffered w/ 8.4% NaHCO3 Instrument used: #15 blade   Hemostasis achieved with: pressure   Hemostasis achieved with  comment:  Electrocautery Outcome: patient tolerated procedure well with no complications   Dressing: mupirocin.    Destruction of lesion Complexity: extensive   Destruction method: electrodesiccation and curettage   Informed consent: discussed and consent obtained   Timeout:  patient name, date of birth, surgical site, and procedure verified Procedure prep:  Patient was prepped and draped in usual sterile fashion Prep type:  Isopropyl alcohol Anesthesia: the lesion was anesthetized in a standard fashion   Anesthetic:  1% lidocaine w/ epinephrine 1-100,000 buffered w/ 8.4% NaHCO3 Curettage performed in three different directions: Yes   Electrodesiccation performed over the curetted area: Yes   Hemostasis achieved with:  pressure and aluminum chloride Outcome: patient tolerated procedure well with no complications   Post-procedure details: sterile dressing applied and wound care instructions given   Dressing type: bandage and petrolatum    Specimen 1 - Surgical pathology Differential Diagnosis: BCC vs other  Check Margins: No 0.7 cm pink papule  Inflamed seborrheic keratosis (4) Scalp x 3, right zygoma x 1  Destruction of lesion - Scalp x 3, right zygoma x 1 Complexity: simple   Destruction method: cryotherapy   Informed consent: discussed and consent obtained   Timeout:  patient name, date of birth, surgical site, and procedure verified Lesion destroyed using liquid nitrogen: Yes   Region frozen until ice ball extended beyond lesion: Yes   Outcome: patient tolerated procedure well with no complications   Post-procedure details: wound care instructions given    Granuloma annulare -chronic persistent condition. Back, legs Discussed topical and intralesional steroids.  Other systemic oral treatments  might be helpful.  Patient declines treatment today as he is currently asymptomatic. Advised patient difficult to treat and may recur even with treatment   Lentigines - Scattered  tan macules - Discussed due to sun exposure - Benign, observe - Call for any changes  Seborrheic Keratoses - Stuck-on, waxy, tan-brown papules and plaques  - Discussed benign etiology and prognosis. - Observe - Call for any changes  Melanocytic Nevi - Tan-brown and/or pink-flesh-colored symmetric macules and papules - Benign appearing on exam today - Observation - Call clinic for new or changing moles - Recommend daily use of broad spectrum spf 30+ sunscreen to sun-exposed areas.   Hemangiomas - Red papules - Discussed benign nature - Observe - Call for any changes  Actinic Damage - Chronic, secondary to cumulative UV/sun exposure - diffuse scaly erythematous macules with underlying dyspigmentation - Recommend daily broad spectrum sunscreen SPF 30+ to sun-exposed areas, reapply every 2 hours as needed.  - Call for new or changing lesions.  Skin cancer screening performed today.  Return in about 2 months (around 09/16/2020).  Luther Redo, CMA, am acting as scribe for Sarina Ser, MD .  Documentation: I have reviewed the above documentation for accuracy and completeness, and I agree with the above.  Sarina Ser, MD

## 2020-07-18 ENCOUNTER — Encounter: Payer: Self-pay | Admitting: Family Medicine

## 2020-07-18 ENCOUNTER — Ambulatory Visit (INDEPENDENT_AMBULATORY_CARE_PROVIDER_SITE_OTHER): Payer: PPO | Admitting: Family Medicine

## 2020-07-18 ENCOUNTER — Encounter: Payer: Self-pay | Admitting: Dermatology

## 2020-07-18 VITALS — BP 155/88 | HR 72 | Ht 63.0 in | Wt 179.6 lb

## 2020-07-18 DIAGNOSIS — Z23 Encounter for immunization: Secondary | ICD-10-CM | POA: Diagnosis not present

## 2020-07-18 DIAGNOSIS — E119 Type 2 diabetes mellitus without complications: Secondary | ICD-10-CM | POA: Diagnosis not present

## 2020-07-18 DIAGNOSIS — I10 Essential (primary) hypertension: Secondary | ICD-10-CM

## 2020-07-18 LAB — GLUCOSE, POCT (MANUAL RESULT ENTRY): POC Glucose: 130 mg/dl — AB (ref 70–99)

## 2020-07-18 NOTE — Assessment & Plan Note (Signed)
Diabetes mellitus Type II, under excellent control.. Discussed general issues about diabetes pathophysiology and management. Reminded to get yearly retinal exam. Had eye exam 4 weeks ago.

## 2020-07-21 ENCOUNTER — Telehealth: Payer: Self-pay

## 2020-07-21 NOTE — Telephone Encounter (Signed)
Patient informed of pathology results 

## 2020-07-21 NOTE — Telephone Encounter (Signed)
-----   Message from Ralene Bathe, MD sent at 07/18/2020  7:32 PM EDT ----- Diagnosis Skin , right lateral canthus/lower eyelid BASAL CELL CARCINOMA, MORPHEAFORM TYPE  Cancer - BCC Already treated Recheck next visit (10/06/20)

## 2020-08-01 DIAGNOSIS — Z23 Encounter for immunization: Secondary | ICD-10-CM | POA: Insufficient documentation

## 2020-08-01 NOTE — Assessment & Plan Note (Signed)
Patient's blood pressure is not within the desired range.  An office visit is recommended. Medication side effects include: no side effects noted Continue current treatment regimen. Dietary sodium restriction. Follow up in 2 month.

## 2020-08-01 NOTE — Progress Notes (Signed)
Established Patient Office Visit  SUBJECTIVE:  Subjective  Patient ID: Anne Parrish, female    DOB: [birth year]  Age: 69 y.o. MRN: [id number]  CC:  Chief Complaint  Patient presents with  . Diabetes    Patient is here today for a 3 month follow up on her diabeties    HPI Anne Parrish is a 69 y.o. female presenting today for     Past Medical History:  Diagnosis Date  . Basal cell carcinoma 07/17/2020   R lat canthus/lower eyelid ED&C   . Diabetes mellitus without complication ([place])   . Hyperlipidemia   . Hypertension     Past Surgical History:  Procedure Laterality Date  . APPENDECTOMY  1960's  . BREAST BIOPSY Right 02/28/2018    STROMAL FIBROSIS, 5 MM, AND LOBULAR INVOLUTION.  Marland Kitchen COLONOSCOPY WITH PROPOFOL N/A 04/15/2016   Procedure: COLONOSCOPY WITH PROPOFOL;  Surgeon: Manya Silvas, MD;  Location: [facility] ENDOSCOPY;  Service: Endoscopy;  Laterality: N/A;  . COLONOSCOPY WITH PROPOFOL N/A 05/27/2020   Procedure: COLONOSCOPY WITH PROPOFOL;  Surgeon: Lucilla Lame, MD;  Location: [facility] ENDOSCOPY;  Service: Endoscopy;  Laterality: N/A;  . REFRACTIVE SURGERY    . TONSILLECTOMY AND ADENOIDECTOMY  1975    Family History  Problem Relation Age of Onset  . Cancer Mother        lung  . Thyroid disease Mother   . Cancer Father        prostate  . Heart disease Father   . Cancer Brother        prostate  . Breast cancer Neg Hx     Social History   Socioeconomic History  . Marital status: Divorced    Spouse name: Not on file  . Number of children: 2  . Years of education: Not on file  . Highest education level: Some college, no degree  Occupational History  . Occupation: retired  . Occupation: owns Engineer, building services  Tobacco Use  . Smoking status: Never Smoker  . Smokeless tobacco: Never Used  Vaping Use  . Vaping Use: Never used  Substance and Sexual Activity  . Alcohol use: No    Alcohol/week: 0.0 standard drinks  . Drug use: No  . Sexual activity: Not on file    Other Topics Concern  . Not on file  Social History Narrative  . Not on file   Social Determinants of Health   Financial Resource Strain:   . Difficulty of Paying Living Expenses: Not on file  Food Insecurity:   . Worried About Charity fundraiser in the Last Year: Not on file  . Ran Out of Food in the Last Year: Not on file  Transportation Needs:   . Lack of Transportation (Medical): Not on file  . Lack of Transportation (Non-Medical): Not on file  Physical Activity:   . Days of Exercise per Week: Not on file  . Minutes of Exercise per Session: Not on file  Stress:   . Feeling of Stress : Not on file  Social Connections:   . Frequency of Communication with Friends and Family: Not on file  . Frequency of Social Gatherings with Friends and Family: Not on file  . Attends Religious Services: Not on file  . Active Member of Clubs or Organizations: Not on file  . Attends Archivist Meetings: Not on file  . Marital Status: Not on file  Intimate Partner Violence:   . Fear of Current or Ex-Partner: Not on file  .  Emotionally Abused: Not on file  . Physically Abused: Not on file  . Sexually Abused: Not on file     Current Outpatient Medications:  .  acetaminophen (TYLENOL) 650 MG CR tablet, Take 650 mg by mouth every 8 (eight) hours as needed for pain., Disp: , Rfl:  .  amLODipine (NORVASC) 5 MG tablet, Take 1 tablet (5 mg total) by mouth daily., Disp: 90 tablet, Rfl: 3 .  atorvastatin (LIPITOR) 10 MG tablet, Take 1 tablet (10 mg total) by mouth daily., Disp: 90 tablet, Rfl: 3 .  Bioflavonoid Products (BIOFLEX PO), Take by mouth daily. , Disp: , Rfl:  .  glucose blood (ONETOUCH VERIO) test strip, To check blood sugar once a day.  DX: E11.9, Disp: 100 each, Rfl: 3 .  lisinopril-hydrochlorothiazide (ZESTORETIC) 10-12.5 MG tablet, Take 1 tablet by mouth daily., Disp: 90 tablet, Rfl: 3 .  metFORMIN (GLUCOPHAGE-XR) 500 MG 24 hr tablet, Take 1 tablet by mouth daily in the  evening for 7 days then increase to  2 tablets by mouth daily in the evening, Disp: 60 tablet, Rfl: 6 .  Multiple Vitamin (MULTIVITAMIN) capsule, Take 1 capsule by mouth daily., Disp: , Rfl:  .  omeprazole (PRILOSEC) 20 MG capsule, TAKE 1 CAPSULE EVERY DAY, Disp: 90 capsule, Rfl: 3 .  ONETOUCH DELICA LANCETS FINE MISC, To check blood sugars once a day.  DX:  E11.9, Disp: 100 each, Rfl: 1 .  pioglitazone (ACTOS) 15 MG tablet, Take 1 tablet (15 mg total) by mouth daily., Disp: 30 tablet, Rfl: 6   Allergies  Allergen Reactions  . Adhesive [Tape]   . Morphine And Related Itching  . Other Other (See Comments)  . Zolpidem   . Benzonatate Rash  . Levofloxacin Rash    ROS Review of Systems  Constitutional: Negative.   HENT: Negative.   Respiratory: Negative.   Cardiovascular: Negative.   Genitourinary: Negative.   Neurological: Negative.   Psychiatric/Behavioral: Negative.   All other systems reviewed and are negative.    OBJECTIVE:    Physical Exam Vitals and nursing note reviewed.  HENT:     Head: Normocephalic.  Eyes:     Pupils: Pupils are equal, round, and reactive to light.  Cardiovascular:     Pulses: Normal pulses.  Abdominal:     General: Abdomen is flat.  Skin:    General: Skin is warm.  Neurological:     Mental Status: She is alert.  Psychiatric:        Mood and Affect: Mood normal.     BP (!) 155/88   Pulse 72   Ht 5\' 3"  (1.6 m)   Wt 179 lb 9.6 oz (81.5 kg)   BMI 31.81 kg/m  Wt Readings from Last 3 Encounters:  07/18/20 179 lb 9.6 oz (81.5 kg)  05/27/20 180 lb (81.6 kg)  04/18/20 187 lb 9.6 oz (85.1 kg)    Health Maintenance Due  Topic Date Due  . TETANUS/TDAP  Never done  . HEMOGLOBIN A1C  10/31/2019  . INFLUENZA VACCINE  04/13/2020  . FOOT EXAM  04/29/2020  . OPHTHALMOLOGY EXAM  05/03/2020    There are no preventive care reminders to display for this patient.  CBC Latest Ref Rng & Units 04/11/2020 05/14/2019 04/19/2018  WBC 3.8 - 10.8  Thousand/uL 3.9 3.7 4.0  Hemoglobin 11.7 - 15.5 g/dL 12.2 12.2 12.0  Hematocrit 35 - 45 % 36.2 35.2 35.2  Platelets 140 - 400 Thousand/uL 251 269 293   CMP Latest  Ref Rng & Units 04/11/2020 04/19/2018 11/12/2016  Glucose 65 - 99 mg/dL 138(H) 121(H) 110(H)  BUN 7 - 25 mg/dL 16 8 11   Creatinine 0.60 - 0.93 mg/dL 0.96(H) 0.85 0.90  Sodium 135 - 146 mmol/L 138 135 142  Potassium 3.5 - 5.3 mmol/L 4.6 4.2 4.5  Chloride 98 - 110 mmol/L 103 98 102  CO2 20 - 32 mmol/L 27 22 22   Calcium 8.6 - 10.4 mg/dL 9.8 9.8 9.4  Total Protein 6.1 - 8.1 g/dL 6.9 6.8 7.0  Total Bilirubin 0.2 - 1.2 mg/dL 0.9 0.8 0.7  Alkaline Phos 39 - 117 IU/L - 71 70  AST 10 - 35 U/L 22 21 32  ALT 6 - 29 U/L 13 14 28     Lab Results  Component Value Date   TSH 1.42 04/11/2020   Lab Results  Component Value Date   ALBUMIN 4.4 04/19/2018   ANIONGAP 11 03/23/2016   Lab Results  Component Value Date   CHOL 140 04/19/2018   CHOL 170 11/12/2016   CHOL 245 (H) 10/08/2015   HDL 51 04/19/2018   HDL 74 11/12/2016   HDL 61 10/08/2015   LDLCALC 68 04/19/2018   LDLCALC 84 11/12/2016   LDLCALC 154 (H) 10/08/2015   CHOLHDL 2.7 04/19/2018   CHOLHDL 2.3 11/12/2016   CHOLHDL 4.0 10/08/2015   Lab Results  Component Value Date   TRIG 106 04/19/2018   Lab Results  Component Value Date   HGBA1C 7.2 (H) 04/19/2018   HGBA1C 6.3 (H) 11/12/2016   HGBA1C 8.8 (H) 10/08/2015      ASSESSMENT & PLAN:   Problem List Items Addressed This Visit      Cardiovascular and Mediastinum   BP (high blood pressure)    Patient's blood pressure is not within the desired range.  An office visit is recommended. Medication side effects include: no side effects noted Continue current treatment regimen. Dietary sodium restriction. Follow up in 2 month.         Endocrine   Type 2 diabetes mellitus without complication, without long-term current use of insulin (HCC) - Primary    Diabetes mellitus Type II, under excellent control.. Discussed  general issues about diabetes pathophysiology and management. Reminded to get yearly retinal exam. Had eye exam 4 weeks ago.        Relevant Orders   POCT glucose (manual entry) (Completed)     Other   Need for COVID-19 vaccine    Vaccine given      Relevant Orders   Pfizer SARS-COV-2 Vaccine (Completed)      No orders of the defined types were placed in this encounter.     Follow-up: No follow-ups on file.    Beckie Salts, [place] [address] [place], [place] [address]

## 2020-08-01 NOTE — Assessment & Plan Note (Signed)
Vaccine given

## 2020-10-06 ENCOUNTER — Ambulatory Visit: Payer: PPO | Admitting: Dermatology

## 2020-10-06 ENCOUNTER — Other Ambulatory Visit: Payer: Self-pay

## 2020-10-06 DIAGNOSIS — L820 Inflamed seborrheic keratosis: Secondary | ICD-10-CM | POA: Diagnosis not present

## 2020-10-06 DIAGNOSIS — Z85828 Personal history of other malignant neoplasm of skin: Secondary | ICD-10-CM | POA: Diagnosis not present

## 2020-10-06 DIAGNOSIS — Z872 Personal history of diseases of the skin and subcutaneous tissue: Secondary | ICD-10-CM | POA: Diagnosis not present

## 2020-10-06 DIAGNOSIS — L570 Actinic keratosis: Secondary | ICD-10-CM

## 2020-10-06 NOTE — Patient Instructions (Signed)
Seborrheic Keratosis  What causes seborrheic keratoses? Seborrheic keratoses are harmless, common skin growths that first appear during adult life.  As time goes by, more growths appear.  Some people may develop a large number of them.  Seborrheic keratoses appear on both covered and uncovered body parts.  They are not caused by sunlight.  The tendency to develop seborrheic keratoses can be inherited.  They vary in color from skin-colored to gray, brown, or even black.  They can be either smooth or have a rough, warty surface.   Seborrheic keratoses are superficial and look as if they were stuck on the skin.  Under the microscope this type of keratosis looks like layers upon layers of skin.  That is why at times the top layer may seem to fall off, but the rest of the growth remains and re-grows.    Treatment Seborrheic keratoses do not need to be treated, but can easily be removed in the office.  Seborrheic keratoses often cause symptoms when they rub on clothing or jewelry.  Lesions can be in the way of shaving.  If they become inflamed, they can cause itching, soreness, or burning.  Removal of a seborrheic keratosis can be accomplished by freezing, burning, or surgery. If any spot bleeds, scabs, or grows rapidly, please return to have it checked, as these can be an indication of a skin cancer.   Cryotherapy Aftercare  Wash gently with soap and water everyday.   Apply Vaseline and Band-Aid daily until healed.  

## 2020-10-06 NOTE — Progress Notes (Signed)
   Follow-Up Visit   Subjective  Anne Parrish is a 70 y.o. female who presents for the following: 2 month follow up (Patient here today for 2 month follow up on bcc on right lateral canthus lower eyelid. Patient states area has healed and she denies noticing any changes. She denies other areas of concerns at this time. ).  The following portions of the chart were reviewed this encounter and updated as appropriate:  Tobacco  Allergies  Meds  Problems  Med Hx  Surg Hx  Fam Hx      Objective  Well appearing patient in no apparent distress; mood and affect are within normal limits.  A focused examination was performed including face, bilateral arms, scalp . Relevant physical exam findings are noted in the Assessment and Plan.  Objective  scalp x1, right nose x1 , left forehead x1 (3): Erythematous keratotic or waxy stuck-on papule or plaque.   Objective  right cheek x1, left zygoma x1, right temple x2 (4): Erythematous thin papules/macules with gritty scale.   Assessment & Plan   History of Basal Cell Carcinoma of the Skin Right lateral canthus lower eyelid- clear  - No evidence of recurrence today - Recommend regular full body skin exams - Recommend daily broad spectrum sunscreen SPF 30+ to sun-exposed areas, reapply every 2 hours as needed.  - Call if any new or changing lesions are noted between office visits  History of PreCancerous Actinic Keratosis  - site(s) of PreCancerous Actinic Keratosis clear today. - these may recur and new lesions may form requiring treatment to prevent transformation into skin cancer - observe for new or changing spots and contact [place] for appointment if occur - photoprotection with sun protective clothing; sunglasses and broad spectrum sunscreen with SPF of at least 30 + and frequent self skin exams recommended - yearly exams by a dermatologist recommended for persons with history of PreCancerous Actinic Keratoses  Inflamed  seborrheic keratosis (3) scalp x1, right nose x1 , left forehead x1  Destruction of lesion - scalp x1, right nose x1 , left forehead x1 Complexity: simple   Destruction method: cryotherapy   Informed consent: discussed and consent obtained   Timeout:  patient name, date of birth, surgical site, and procedure verified Lesion destroyed using liquid nitrogen: Yes   Region frozen until ice ball extended beyond lesion: Yes   Outcome: patient tolerated procedure well with no complications   Post-procedure details: wound care instructions given    Actinic keratosis (4) right cheek x1, left zygoma x1, right temple x2  Destruction of lesion - right cheek x1, left zygoma x1, right temple x2 Complexity: simple   Destruction method: cryotherapy   Informed consent: discussed and consent obtained   Timeout:  patient name, date of birth, surgical site, and procedure verified Lesion destroyed using liquid nitrogen: Yes   Region frozen until ice ball extended beyond lesion: Yes   Outcome: patient tolerated procedure well with no complications   Post-procedure details: wound care instructions given    Return in about 6 months (around 04/05/2021) for follow up on Ak and Sk.  IRuthell Rummage, CMA, am acting as scribe for Sarina Ser, MD.  Documentation: I have reviewed the above documentation for accuracy and completeness, and I agree with the above.  Sarina Ser, MD

## 2020-10-07 ENCOUNTER — Encounter: Payer: Self-pay | Admitting: Dermatology

## 2020-11-07 ENCOUNTER — Other Ambulatory Visit: Payer: Self-pay | Admitting: Internal Medicine

## 2020-11-07 DIAGNOSIS — E119 Type 2 diabetes mellitus without complications: Secondary | ICD-10-CM

## 2020-12-02 ENCOUNTER — Other Ambulatory Visit: Payer: Self-pay | Admitting: Internal Medicine

## 2020-12-02 DIAGNOSIS — E119 Type 2 diabetes mellitus without complications: Secondary | ICD-10-CM

## 2020-12-23 ENCOUNTER — Other Ambulatory Visit: Payer: Self-pay | Admitting: Internal Medicine

## 2020-12-23 DIAGNOSIS — I10 Essential (primary) hypertension: Secondary | ICD-10-CM

## 2021-02-18 ENCOUNTER — Other Ambulatory Visit: Payer: Self-pay

## 2021-02-18 MED ORDER — CIPROFLOXACIN HCL 500 MG PO TABS: 500.0000 mg | ORAL_TABLET | Freq: Two times a day (BID) | ORAL | 0 refills | Status: AC

## 2021-04-02 ENCOUNTER — Other Ambulatory Visit: Payer: Self-pay

## 2021-04-02 ENCOUNTER — Ambulatory Visit: Payer: PPO | Admitting: Dermatology

## 2021-04-02 DIAGNOSIS — L570 Actinic keratosis: Secondary | ICD-10-CM

## 2021-04-02 DIAGNOSIS — L821 Other seborrheic keratosis: Secondary | ICD-10-CM | POA: Diagnosis not present

## 2021-04-02 DIAGNOSIS — L920 Granuloma annulare: Secondary | ICD-10-CM

## 2021-04-02 DIAGNOSIS — L820 Inflamed seborrheic keratosis: Secondary | ICD-10-CM

## 2021-04-02 DIAGNOSIS — L578 Other skin changes due to chronic exposure to nonionizing radiation: Secondary | ICD-10-CM

## 2021-04-02 NOTE — Progress Notes (Signed)
Follow-Up Visit   Subjective  Anne Parrish is a 70 y.o. female who presents for the following: Follow-up (Patient here today for 6 month follow up on ak on right cheek, left zygoma, and right temple and follow up on Isk's at right nose, scalp, and left forehead. No other new concerns today. ).  The following portions of the chart were reviewed this encounter and updated as appropriate:  Tobacco  Allergies  Meds  Problems  Med Hx  Surg Hx  Fam Hx     Objective  Well appearing patient in no apparent distress; mood and affect are within normal limits.  A focused examination was performed including face, scalp. Relevant physical exam findings are noted in the Assessment and Plan.  Right Nasal bridge x 1, right vertex scalp x 1, left forehead x 1 (3) Erythematous keratotic or waxy stuck-on papule or plaque.   Right Temple x 2, right cheek 1, left cheek x 2, left brow x 1 (6) Erythematous thin papules/macules with gritty scale.   back, legs, arms Annular pink plaques  Assessment & Plan  Inflamed seborrheic keratosis Right Nasal bridge x 1, right vertex scalp x 1, left forehead x 1  Prior to procedure, discussed risks of blister formation, small wound, skin dyspigmentation, or rare scar following cryotherapy. Recommend Vaseline ointment to treated areas while healing.   Destruction of lesion - Right Nasal bridge x 1, right vertex scalp x 1, left forehead x 1 Complexity: simple   Destruction method: cryotherapy   Informed consent: discussed and consent obtained   Timeout:  patient name, date of birth, surgical site, and procedure verified Lesion destroyed using liquid nitrogen: Yes   Region frozen until ice ball extended beyond lesion: Yes   Outcome: patient tolerated procedure well with no complications   Post-procedure details: wound care instructions given    Actinic keratosis (6) Right Temple x 2, right cheek 1, left cheek x 2, left brow x 1  Actinic keratoses are  precancerous spots that appear secondary to cumulative UV radiation exposure/sun exposure over time. They are chronic with expected duration over 1 year. A portion of actinic keratoses will progress to squamous cell carcinoma of the skin. It is not possible to reliably predict which spots will progress to skin cancer and so treatment is recommended to prevent development of skin cancer.  Recommend daily broad spectrum sunscreen SPF 30+ to sun-exposed areas, reapply every 2 hours as needed.  Recommend staying in the shade or wearing long sleeves, sun glasses (UVA+UVB protection) and wide brim hats (4-inch brim around the entire circumference of the hat). Call for new or changing lesions.  Destruction of lesion - Right Temple x 2, right cheek 1, left cheek x 2, left brow x 1 Complexity: simple   Destruction method: cryotherapy   Informed consent: discussed and consent obtained   Timeout:  patient name, date of birth, surgical site, and procedure verified Lesion destroyed using liquid nitrogen: Yes   Region frozen until ice ball extended beyond lesion: Yes   Outcome: patient tolerated procedure well with no complications   Post-procedure details: wound care instructions given    Granuloma annulare back, legs, arms  Granuloma annulare -chronic persistent condition.  Discussed topical and intralesional steroids.  Other systemic oral treatments might be helpful.  Patient declines treatment today as he is currently asymptomatic. Advised patient difficult to treat and may recur even with treatment  Not currently treating  Arms clear today at exam , legs persistent  Seborrheic Keratoses - Stuck-on, waxy, tan-brown papules and/or plaques  - Benign-appearing - Discussed benign etiology and prognosis. - Observe - Call for any changes  Actinic Damage - chronic, secondary to cumulative UV radiation exposure/sun exposure over time - diffuse scaly erythematous macules with underlying  dyspigmentation - Recommend daily broad spectrum sunscreen SPF 30+ to sun-exposed areas, reapply every 2 hours as needed.  - Recommend staying in the shade or wearing long sleeves, sun glasses (UVA+UVB protection) and wide brim hats (4-inch brim around the entire circumference of the hat). - Call for new or changing lesions.  Return in about 1 year (around 04/02/2022) for tbse.  IRuthell Rummage, CMA, am acting as scribe for Sarina Ser, MD. Documentation: I have reviewed the above documentation for accuracy and completeness, and I agree with the above.  Sarina Ser, MD

## 2021-04-02 NOTE — Patient Instructions (Addendum)
Actinic keratoses are precancerous spots that appear secondary to cumulative UV radiation exposure/sun exposure over time. They are chronic with expected duration over 1 year. A portion of actinic keratoses will progress to squamous cell carcinoma of the skin. It is not possible to reliably predict which spots will progress to skin cancer and so treatment is recommended to prevent development of skin cancer.  Recommend daily broad spectrum sunscreen SPF 30+ to sun-exposed areas, reapply every 2 hours as needed.  Recommend staying in the shade or wearing long sleeves, sun glasses (UVA+UVB protection) and wide brim hats (4-inch brim around the entire circumference of the hat). Call for new or changing lesions.   Cryotherapy Aftercare  Wash gently with soap and water everyday.   Apply Vaseline and Band-Aid daily until healed.   If you have any questions or concerns for your doctor, please call our main line at [phone] and press option 4 to reach your doctor's medical assistant. If no one answers, please leave a voicemail as directed and we will return your call as soon as possible. Messages left after 4 pm will be answered the following business day.   You may also send us a message via MyChart. We typically respond to MyChart messages within 1-2 business days.  For prescription refills, please ask your pharmacy to contact our office. Our fax number is [phone].  If you have an urgent issue when the clinic is closed that cannot wait until the next business day, you can page your doctor at the number below.    Please note that while we do our best to be available for urgent issues outside of office hours, we are not available 24/7.   If you have an urgent issue and are unable to reach us, you may choose to seek medical care at your doctor's office, retail clinic, urgent care center, or emergency room.  If you have a medical emergency, please immediately call 911 or go to the emergency  department.  Pager Numbers  - Dr. Kowalski: [phone]  - Dr. Moye: [phone]  - Dr. Stewart: [phone]  In the event of inclement weather, please call our main line at [phone] for an update on the status of any delays or closures.  Dermatology Medication Tips: Please keep the boxes that topical medications come in in order to help keep track of the instructions about where and how to use these. Pharmacies typically print the medication instructions only on the boxes and not directly on the medication tubes.   If your medication is too expensive, please contact our office at [phone] option 4 or send us a message through MyChart.   We are unable to tell what your co-pay for medications will be in advance as this is different depending on your insurance coverage. However, we may be able to find a substitute medication at lower cost or fill out paperwork to get insurance to cover a needed medication.   If a prior authorization is required to get your medication covered by your insurance company, please allow us 1-2 business days to complete this process.  Drug prices often vary depending on where the prescription is filled and some pharmacies may offer cheaper prices.  The website www.goodrx.com contains coupons for medications through different pharmacies. The prices here do not account for what the cost may be with help from insurance (it may be cheaper with your insurance), but the website can give you the price if you did not use any insurance.  - You   can print the associated coupon and take it with your prescription to the pharmacy.  - You may also stop by our office during regular business hours and pick up a GoodRx coupon card.  - If you need your prescription sent electronically to a different pharmacy, notify our office through [place] MyChart or by phone at [phone] option 4.  

## 2021-04-04 ENCOUNTER — Encounter: Payer: Self-pay | Admitting: Dermatology

## 2021-05-26 ENCOUNTER — Other Ambulatory Visit: Payer: Self-pay | Admitting: Internal Medicine

## 2021-05-26 DIAGNOSIS — Z1231 Encounter for screening mammogram for malignant neoplasm of breast: Secondary | ICD-10-CM

## 2021-05-29 ENCOUNTER — Other Ambulatory Visit: Payer: Self-pay | Admitting: Internal Medicine

## 2021-05-29 DIAGNOSIS — E119 Type 2 diabetes mellitus without complications: Secondary | ICD-10-CM

## 2021-06-05 ENCOUNTER — Ambulatory Visit (INDEPENDENT_AMBULATORY_CARE_PROVIDER_SITE_OTHER): Payer: PPO

## 2021-06-05 DIAGNOSIS — Z0000 Encounter for general adult medical examination without abnormal findings: Secondary | ICD-10-CM | POA: Diagnosis not present

## 2021-06-05 NOTE — Progress Notes (Signed)
I have reviewed this visit and agree with the documentation.   

## 2021-06-05 NOTE — Progress Notes (Signed)
Subjective:   Anne Parrish is a 70 y.o. female who presents for Medicare Annual (Subsequent) preventive examination. Visit done by audio.   Patient location: Home Provider location: Home  Review of Systems    N/A Cardiac Risk Factors include: none     Objective:    There were no vitals filed for this visit. There is no height or weight on file to calculate BMI.  Advanced Directives 06/05/2021 05/27/2020 05/10/2019 04/19/2018 04/15/2016 12/30/2015 12/30/2015  Does Patient Have a Medical Advance Directive? No No No No No No Yes  Type of Advance Directive - - - - - - Press photographer;Living will  Does patient want to make changes to medical advance directive? - - - Yes (MAU/Ambulatory/Procedural Areas - Information given) - - -  Would patient like information on creating a medical advance directive? - - No - Patient declined - No - patient declined information - -    Current Medications (verified) Outpatient Encounter Medications as of 06/05/2021  Medication Sig   acetaminophen (TYLENOL) 650 MG CR tablet Take 650 mg by mouth every 8 (eight) hours as needed for pain.   amLODipine (NORVASC) 5 MG tablet TAKE ONE TABLET BY MOUTH EVERY DAY   Bioflavonoid Products (BIOFLEX PO) Take by mouth daily.    glucose blood (ONETOUCH VERIO) test strip To check blood sugar once a day.  DX: E11.9   lisinopril-hydrochlorothiazide (ZESTORETIC) 10-12.5 MG tablet TAKE 1 TABLET BY MOUTH DAILY.   Multiple Vitamin (MULTIVITAMIN) capsule Take 1 capsule by mouth daily.   omeprazole (PRILOSEC) 20 MG capsule TAKE 1 CAPSULE EVERY DAY   ONETOUCH DELICA LANCETS FINE MISC To check blood sugars once a day.  DX:  E11.9   pioglitazone (ACTOS) 15 MG tablet TAKE 1 TABLET BY MOUTH DAILY.   [DISCONTINUED] atorvastatin (LIPITOR) 10 MG tablet Take 1 tablet (10 mg total) by mouth daily. (Patient not taking: Reported on 06/05/2021)   No facility-administered encounter medications on file as of 06/05/2021.     Allergies (verified) Adhesive [tape], Morphine and related, Other, Zolpidem, Benzonatate, and Levofloxacin   History: Past Medical History:  Diagnosis Date   Basal cell carcinoma 07/17/2020   R lat canthus/lower eyelid ED&C    Diabetes mellitus without complication ([place])    Hyperlipidemia    Hypertension    Past Surgical History:  Procedure Laterality Date   APPENDECTOMY  1960's   BREAST BIOPSY Right 02/28/2018    STROMAL FIBROSIS, 5 MM, AND LOBULAR INVOLUTION.   COLONOSCOPY WITH PROPOFOL N/A 04/15/2016   Procedure: COLONOSCOPY WITH PROPOFOL;  Surgeon: Manya Silvas, MD;  Location: [facility]COPY;  Service: Endoscopy;  Laterality: N/A;   COLONOSCOPY WITH PROPOFOL N/A 05/27/2020   Procedure: COLONOSCOPY WITH PROPOFOL;  Surgeon: Lucilla Lame, MD;  Location: [facility] ENDOSCOPY;  Service: Endoscopy;  Laterality: N/A;   REFRACTIVE SURGERY     TONSILLECTOMY AND ADENOIDECTOMY  1975   Family History  Problem Relation Age of Onset   Cancer Mother        lung   Thyroid disease Mother    Cancer Father        prostate   Heart disease Father    Cancer Brother        prostate   Breast cancer Neg Hx    Social History   Socioeconomic History   Marital status: Divorced    Spouse name: Not on file   Number of children: 2   Years of education: Not on file   Highest  education level: Some college, no degree  Occupational History   Occupation: retired   Occupation: owns Engineer, building services  Tobacco Use   Smoking status: Never   Smokeless tobacco: Never  Vaping Use   Vaping Use: Never used  Substance and Sexual Activity   Alcohol use: Not Currently   Drug use: No   Sexual activity: Not Currently  Other Topics Concern   Not on file  Social History Narrative   Not on file   Social Determinants of Health   Financial Resource Strain: Low Risk    Difficulty of Paying Living Expenses: Not hard at all  Food Insecurity: No Food Insecurity   Worried About Charity fundraiser in the  Last Year: Never true   Arboriculturist in the Last Year: Never true  Transportation Needs: No Transportation Needs   Lack of Transportation (Medical): No   Lack of Transportation (Non-Medical): No  Physical Activity: Sufficiently Active   Days of Exercise per Week: 7 days   Minutes of Exercise per Session: 30 min  Stress: No Stress Concern Present   Feeling of Stress : Only a little  Social Connections: Moderately Integrated   Frequency of Communication with Friends and Family: Three times a week   Frequency of Social Gatherings with Friends and Family: Three times a week   Attends Religious Services: More than 4 times per year   Active Member of Clubs or Organizations: No   Attends Archivist Meetings: Never   Marital Status: Living with partner    Tobacco Counseling Counseling given: Not Answered   Clinical Intake:  Pre-visit preparation completed: Yes  Pain : No/denies pain     Diabetes: Yes  How often do you need to have someone help you when you read instructions, pamphlets, or other written materials from your doctor or pharmacy?: 1 - Never What is the last grade level you completed in school?: Some College  Diabetic? Yes  Interpreter Needed?: No  Information entered by :: Anson [place] CMA   Activities of Daily Living In your present state of health, do you have any difficulty performing the following activities: 06/05/2021  Hearing? N  Vision? N  Difficulty concentrating or making decisions? N  Walking or climbing stairs? N  Dressing or bathing? N  Doing errands, shopping? N  Preparing Food and eating ? N  Using the Toilet? N  In the past six months, have you accidently leaked urine? N  Do you have problems with loss of bowel control? N  Managing your Medications? N  Managing your Finances? N  Housekeeping or managing your Housekeeping? N  Some recent data might be hidden    Patient Care Team: Cletis Athens, MD as PCP - General  (Internal Medicine) Thelma Comp, OD as Consulting Physician (Optometry) Ralene Bathe, MD (Dermatology) Gabriel Carina Betsey Holiday, MD as Physician Assistant (Endocrinology)  Indicate any recent Medical Services you may have received from other than [facility] providers in the past year (date may be approximate).     Assessment:   This is a routine wellness examination for Leon.  Hearing/Vision screen No results found.  Dietary issues and exercise activities discussed: Current Exercise Habits: Home exercise routine, Type of exercise: walking, Time (Minutes): 30, Frequency (Times/Week): 7, Weekly Exercise (Minutes/Week): 210, Intensity: Mild, Exercise limited by: None identified   Goals Addressed   None    Depression Screen PHQ 2/9 Scores 06/05/2021 04/18/2020 05/10/2019 04/19/2018 04/19/2018 12/30/2016 12/30/2015  PHQ - 2 Score 0  0 0 0 0 0 0  PHQ- 9 Score - - - 1 - 0 -    Fall Risk Fall Risk  06/05/2021 04/18/2020 05/11/2019 05/10/2019 04/19/2018  Falls in the past year? 0 0 0 0 No  Number falls in past yr: 0 0 0 - -  Injury with Fall? 0 0 0 - -  Risk for fall due to : No Fall Risks - - - -  Follow up Falls evaluation completed - - - -    FALL RISK PREVENTION PERTAINING TO THE HOME:  Any stairs in or around the home? Yes  If so, are there any without handrails? No Home free of loose throw rugs in walkways, pet beds, electrical cords, etc? Yes  Adequate lighting in your home to reduce risk of falls? Yes   ASSISTIVE DEVICES UTILIZED TO PREVENT FALLS:  Life alert? No  Use of a cane, walker or w/c? No  Grab bars in the bathroom? Yes  Shower chair or bench in shower? Yes  Elevated toilet seat or a handicapped toilet? Yes   TIMED UP AND GO:  Was the test performed? No .  Length of time to ambulate 10 feet: 0 sec.     Cognitive Function:     6CIT Screen 05/11/2019  What Year? 0 points  What month? 0 points  What time? 0 points  Count back from 20 0 points  Months in reverse 0 points   Repeat phrase 0 points  Total Score 0    Immunizations Immunization History  Administered Date(s) Administered   Fluad Quad(high Dose 65+) 05/11/2019   Influenza, High Dose Seasonal PF 07/02/2018   PFIZER(Purple Top)SARS-COV-2 Vaccination 11/11/2019, 12/04/2019, 07/18/2020   Pneumococcal Conjugate-13 08/13/2015   Pneumococcal Polysaccharide-23 12/30/2016    TDAP status: Due, Education has been provided regarding the importance of this vaccine. Advised may receive this vaccine at local pharmacy or Health Dept. Aware to provide a copy of the vaccination record if obtained from local pharmacy or Health Dept. Verbalized acceptance and understanding.  Flu Vaccine status: Due, Education has been provided regarding the importance of this vaccine. Advised may receive this vaccine at local pharmacy or Health Dept. Aware to provide a copy of the vaccination record if obtained from local pharmacy or Health Dept. Verbalized acceptance and understanding.  Pneumococcal vaccine status: Up to date  Covid-19 vaccine status: Information provided on how to obtain vaccines.   Qualifies for Shingles Vaccine? Yes   Zostavax completed No   Shingrix Completed?: No.    Education has been provided regarding the importance of this vaccine. Patient has been advised to call insurance company to determine out of pocket expense if they have not yet received this vaccine. Advised may also receive vaccine at local pharmacy or Health Dept. Verbalized acceptance and understanding.  Screening Tests Health Maintenance  Topic Date Due   TETANUS/TDAP  Never done   Zoster Vaccines- Shingrix (1 of 2) Never done   HEMOGLOBIN A1C  10/31/2019   FOOT EXAM  04/29/2020   OPHTHALMOLOGY EXAM  05/03/2020   COVID-19 Vaccine (4 - Booster for Pfizer series) 10/10/2020   DEXA SCAN  01/18/2021   INFLUENZA VACCINE  04/13/2021   MAMMOGRAM  06/26/2022   COLONOSCOPY (Pts 45-26yrs Insurance coverage will need to be confirmed)   05/27/2025   Hepatitis C Screening  Completed   HPV VACCINES  Aged Out    Health Maintenance  Health Maintenance Due  Topic Date Due   TETANUS/TDAP  Never done  Zoster Vaccines- Shingrix (1 of 2) Never done   HEMOGLOBIN A1C  10/31/2019   FOOT EXAM  04/29/2020   OPHTHALMOLOGY EXAM  05/03/2020   COVID-19 Vaccine (4 - Booster for Pfizer series) 10/10/2020   DEXA SCAN  01/18/2021   INFLUENZA VACCINE  04/13/2021    Colorectal cancer screening: Type of screening: Colonoscopy. Completed 05/27/2020. Repeat every 5 years  Mammogram status: Ordered 07/09/2021. Pt provided with contact info and advised to call to schedule appt.   Bone Density status: Completed 01/19/2016. Results reflect: Bone density results: NORMAL. Repeat every 5 years.  Lung Cancer Screening: (Low Dose CT Chest recommended if Age 3-80 years, 30 pack-year currently smoking OR have quit w/in 15years.) does not qualify.   Lung Cancer Screening Referral: N/A  Additional Screening:  Hepatitis C Screening: does not qualify; Completed No  Vision Screening: Recommended annual ophthalmology exams for early detection of glaucoma and other disorders of the eye. Is the patient up to date with their annual eye exam?  Yes  Who is the provider or what is the name of the office in which the patient attends annual eye exams? [facility]  If pt is not established with a provider, would they like to be referred to a provider to establish care? No .   Dental Screening: Recommended annual dental exams for proper oral hygiene  Community Resource Referral / Chronic Care Management: CRR required this visit?  No   CCM required this visit?  No      Plan:     I have personally reviewed and noted the following in the patient's chart:   Medical and social history Use of alcohol, tobacco or illicit drugs  Current medications and supplements including opioid prescriptions.  Functional ability and status Nutritional  status Physical activity Advanced directives List of other physicians Hospitalizations, surgeries, and ER visits in previous 12 months Vitals Screenings to include cognitive, depression, and falls Referrals and appointments  In addition, I have reviewed and discussed with patient certain preventive protocols, quality metrics, and best practice recommendations. A written personalized care plan for preventive services as well as general preventive health recommendations were provided to patient.    Ms. Parfitt , Thank you for taking time to come for your Medicare Wellness Visit. I appreciate your ongoing commitment to your health goals. Please review the following plan we discussed and let me know if I can assist you in the future.   These are the goals we discussed:  Goals      Exercise 3x per week (30 min per time)     Recommend to start walking 3 days a week for at least 30 minutes at a time.         This is a list of the screening recommended for you and due dates:  Health Maintenance  Topic Date Due   Tetanus Vaccine  Never done   Zoster (Shingles) Vaccine (1 of 2) Never done   Hemoglobin A1C  10/31/2019   Complete foot exam   04/29/2020   Eye exam for diabetics  05/03/2020   COVID-19 Vaccine (4 - Booster for Pfizer series) 10/10/2020   DEXA scan (bone density measurement)  01/18/2021   Flu Shot  04/13/2021   Mammogram  06/26/2022   Colon Cancer Screening  05/27/2025   Hepatitis C Screening: USPSTF Recommendation to screen - Ages 18-79 yo.  Completed   HPV Vaccine  Aged [address] [place], [place]   06/05/2021

## 2021-06-22 DIAGNOSIS — H21233 Degeneration of iris (pigmentary), bilateral: Secondary | ICD-10-CM | POA: Diagnosis not present

## 2021-06-22 DIAGNOSIS — H40013 Open angle with borderline findings, low risk, bilateral: Secondary | ICD-10-CM | POA: Diagnosis not present

## 2021-06-22 DIAGNOSIS — E119 Type 2 diabetes mellitus without complications: Secondary | ICD-10-CM | POA: Diagnosis not present

## 2021-06-22 DIAGNOSIS — H2513 Age-related nuclear cataract, bilateral: Secondary | ICD-10-CM | POA: Diagnosis not present

## 2021-06-22 DIAGNOSIS — H524 Presbyopia: Secondary | ICD-10-CM | POA: Diagnosis not present

## 2021-06-22 DIAGNOSIS — H5213 Myopia, bilateral: Secondary | ICD-10-CM | POA: Diagnosis not present

## 2021-06-22 LAB — HM DIABETES EYE EXAM

## 2021-06-30 ENCOUNTER — Ambulatory Visit: Payer: PPO | Admitting: Internal Medicine

## 2021-07-08 ENCOUNTER — Ambulatory Visit (INDEPENDENT_AMBULATORY_CARE_PROVIDER_SITE_OTHER): Payer: PPO | Admitting: Internal Medicine

## 2021-07-08 ENCOUNTER — Other Ambulatory Visit: Payer: Self-pay

## 2021-07-08 ENCOUNTER — Encounter: Payer: Self-pay | Admitting: Internal Medicine

## 2021-07-08 VITALS — BP 140/80 | HR 57 | Ht 63.0 in | Wt 178.9 lb

## 2021-07-08 DIAGNOSIS — Z23 Encounter for immunization: Secondary | ICD-10-CM

## 2021-07-08 DIAGNOSIS — M25531 Pain in right wrist: Secondary | ICD-10-CM | POA: Diagnosis not present

## 2021-07-08 DIAGNOSIS — I10 Essential (primary) hypertension: Secondary | ICD-10-CM

## 2021-07-08 DIAGNOSIS — E7800 Pure hypercholesterolemia, unspecified: Secondary | ICD-10-CM

## 2021-07-08 DIAGNOSIS — M25532 Pain in left wrist: Secondary | ICD-10-CM | POA: Diagnosis not present

## 2021-07-08 DIAGNOSIS — J309 Allergic rhinitis, unspecified: Secondary | ICD-10-CM

## 2021-07-08 DIAGNOSIS — E119 Type 2 diabetes mellitus without complications: Secondary | ICD-10-CM

## 2021-07-08 DIAGNOSIS — K219 Gastro-esophageal reflux disease without esophagitis: Secondary | ICD-10-CM

## 2021-07-08 LAB — GLUCOSE, POCT (MANUAL RESULT ENTRY): POC Glucose: 122 mg/dl — AB (ref 70–99)

## 2021-07-08 NOTE — Assessment & Plan Note (Signed)
Take Claritin 10 mg as needed

## 2021-07-08 NOTE — Assessment & Plan Note (Signed)

## 2021-07-08 NOTE — Assessment & Plan Note (Signed)
Patient educated extensively on acid reflux lifestyle modification, including buying a bed wedge, not eating 3 hrs before bedtime, diet modifications, and handout given for the same.  

## 2021-07-08 NOTE — Assessment & Plan Note (Signed)

## 2021-07-08 NOTE — Progress Notes (Signed)
Established Patient Office Visit  Subjective:  Patient ID: Anne Parrish, female    DOB: 07-14-1951  Age: 70 y.o. MRN: [id number]  CC:  Chief Complaint  Patient presents with   General check up    HPI  Anne Parrish presents for general check  Past Medical History:  Diagnosis Date   Basal cell carcinoma 07/17/2020   R lat canthus/lower eyelid ED&C    Diabetes mellitus without complication ([place])    Hyperlipidemia    Hypertension     Past Surgical History:  Procedure Laterality Date   APPENDECTOMY  1960's   BREAST BIOPSY Right 02/28/2018    STROMAL FIBROSIS, 5 MM, AND LOBULAR INVOLUTION.   COLONOSCOPY WITH PROPOFOL N/A 04/15/2016   Procedure: COLONOSCOPY WITH PROPOFOL;  Surgeon: Manya Silvas, MD;  Location: [facility] ENDOSCOPY;  Service: Endoscopy;  Laterality: N/A;   COLONOSCOPY WITH PROPOFOL N/A 05/27/2020   Procedure: COLONOSCOPY WITH PROPOFOL;  Surgeon: Lucilla Lame, MD;  Location[facility] ENDOSCOPY;  Service: Endoscopy;  Laterality: N/A;   REFRACTIVE SURGERY     TONSILLECTOMY AND ADENOIDECTOMY  1975    Family History  Problem Relation Age of Onset   Cancer Mother        lung   Thyroid disease Mother    Cancer Father        prostate   Heart disease Father    Cancer Brother        prostate   Breast cancer Neg Hx     Social History   Socioeconomic History   Marital status: Divorced    Spouse name: Not on file   Number of children: 2   Years of education: Not on file   Highest education level: Some college, no degree  Occupational History   Occupation: retired   Occupation: owns Engineer, building services  Tobacco Use   Smoking status: Never   Smokeless tobacco: Never  Scientific laboratory technician Use: Never used  Substance and Sexual Activity   Alcohol use: Not Currently   Drug use: No   Sexual activity: Not Currently  Other Topics Concern   Not on file  Social History Narrative   Not on file   Social Determinants of Health   Financial Resource Strain: Low Risk     Difficulty of Paying Living Expenses: Not hard at all  Food Insecurity: No Food Insecurity   Worried About Charity fundraiser in the Last Year: Never true   Arboriculturist in the Last Year: Never true  Transportation Needs: No Transportation Needs   Lack of Transportation (Medical): No   Lack of Transportation (Non-Medical): No  Physical Activity: Sufficiently Active   Days of Exercise per Week: 7 days   Minutes of Exercise per Session: 30 min  Stress: No Stress Concern Present   Feeling of Stress : Only a little  Social Connections: Moderately Integrated   Frequency of Communication with Friends and Family: Three times a week   Frequency of Social Gatherings with Friends and Family: Three times a week   Attends Religious Services: More than 4 times per year   Active Member of Clubs or Organizations: No   Attends Archivist Meetings: Never   Marital Status: Living with partner  Intimate Partner Violence: Not At Risk   Fear of Current or Ex-Partner: No   Emotionally Abused: No   Physically Abused: No   Sexually Abused: No     Current Outpatient Medications:    acetaminophen (TYLENOL)  650 MG CR tablet, Take 650 mg by mouth every 8 (eight) hours as needed for pain., Disp: , Rfl:    amLODipine (NORVASC) 5 MG tablet, TAKE ONE TABLET BY MOUTH EVERY DAY, Disp: 90 tablet, Rfl: 3   Bioflavonoid Products (BIOFLEX PO), Take by mouth daily. , Disp: , Rfl:    glucose blood (ONETOUCH VERIO) test strip, To check blood sugar once a day.  DX: E11.9, Disp: 100 each, Rfl: 3   lisinopril-hydrochlorothiazide (ZESTORETIC) 10-12.5 MG tablet, TAKE 1 TABLET BY MOUTH DAILY., Disp: 90 tablet, Rfl: 3   Multiple Vitamin (MULTIVITAMIN) capsule, Take 1 capsule by mouth daily., Disp: , Rfl:    omeprazole (PRILOSEC) 20 MG capsule, TAKE 1 CAPSULE EVERY DAY, Disp: 90 capsule, Rfl: 3   ONETOUCH DELICA LANCETS FINE MISC, To check blood sugars once a day.  DX:  E11.9, Disp: 100 each, Rfl: 1   pioglitazone  (ACTOS) 15 MG tablet, TAKE 1 TABLET BY MOUTH DAILY., Disp: 30 tablet, Rfl: 6   Allergies  Allergen Reactions   Adhesive [Tape]    Morphine And Related Itching   Other Other (See Comments)   Zolpidem    Benzonatate Rash   Levofloxacin Rash    ROS Review of Systems  Constitutional: Negative.   HENT: Negative.    Eyes: Negative.   Respiratory: Negative.    Cardiovascular: Negative.   Gastrointestinal: Negative.   Endocrine: Negative.   Genitourinary: Negative.   Musculoskeletal: Negative.   Skin: Negative.   Allergic/Immunologic: Negative.   Neurological: Negative.   Hematological: Negative.   Psychiatric/Behavioral: Negative.    All other systems reviewed and are negative.    Objective:    Physical Exam Vitals reviewed.  Constitutional:      Appearance: Normal appearance.  HENT:     Mouth/Throat:     Mouth: Mucous membranes are moist.  Eyes:     Pupils: Pupils are equal, round, and reactive to light.  Neck:     Vascular: No carotid bruit.  Cardiovascular:     Rate and Rhythm: Normal rate and regular rhythm.     Pulses: Normal pulses.     Heart sounds: Normal heart sounds.  Pulmonary:     Effort: Pulmonary effort is normal.     Breath sounds: Normal breath sounds.  Abdominal:     General: Bowel sounds are normal.     Palpations: Abdomen is soft. There is no hepatomegaly, splenomegaly or mass.     Tenderness: There is no abdominal tenderness.     Hernia: No hernia is present.  Musculoskeletal:        General: No tenderness.     Cervical back: Neck supple.     Right lower leg: No edema.     Left lower leg: No edema.  Skin:    Findings: No rash.  Neurological:     Mental Status: She is alert and oriented to person, place, and time.     Motor: No weakness.  Psychiatric:        Mood and Affect: Mood and affect normal.        Behavior: Behavior normal.    BP (!) 173/63   Pulse (!) 57   Ht 5\' 3"  (1.6 m)   Wt 178 lb 14.4 oz (81.1 kg)   BMI 31.69 kg/m   Wt Readings from Last 3 Encounters:  07/08/21 178 lb 14.4 oz (81.1 kg)  07/18/20 179 lb 9.6 oz (81.5 kg)  05/27/20 180 lb (81.6 kg)  Health Maintenance Due  Topic Date Due   Zoster Vaccines- Shingrix (1 of 2) Never done   HEMOGLOBIN A1C  10/31/2019   FOOT EXAM  04/29/2020   COVID-19 Vaccine (4 - Booster for Pfizer series) 09/12/2020   DEXA SCAN  01/18/2021    There are no preventive care reminders to display for this patient.  Lab Results  Component Value Date   TSH 1.42 04/11/2020   Lab Results  Component Value Date   WBC 3.9 04/11/2020   HGB 12.2 04/11/2020   HCT 36.2 04/11/2020   MCV 89.4 04/11/2020   PLT 251 04/11/2020   Lab Results  Component Value Date   NA 138 04/11/2020   K 4.6 04/11/2020   CO2 27 04/11/2020   GLUCOSE 138 (H) 04/11/2020   BUN 16 04/11/2020   CREATININE 0.96 (H) 04/11/2020   BILITOT 0.9 04/11/2020   ALKPHOS 71 04/19/2018   AST 22 04/11/2020   ALT 13 04/11/2020   PROT 6.9 04/11/2020   ALBUMIN 4.4 04/19/2018   CALCIUM 9.8 04/11/2020   ANIONGAP 11 03/23/2016   Lab Results  Component Value Date   CHOL 140 04/19/2018   Lab Results  Component Value Date   HDL 51 04/19/2018   Lab Results  Component Value Date   LDLCALC 68 04/19/2018   Lab Results  Component Value Date   TRIG 106 04/19/2018   Lab Results  Component Value Date   CHOLHDL 2.7 04/19/2018   Lab Results  Component Value Date   HGBA1C 7.2 (H) 04/19/2018      Assessment & Plan:   Problem List Items Addressed This Visit       Cardiovascular and Mediastinum   BP (high blood pressure)     Patient denies any chest pain or shortness of breath there is no history of palpitation or paroxysmal nocturnal dyspnea   patient was advised to follow low-salt low-cholesterol diet    ideally I want to keep systolic blood pressure below 130 mmHg, patient was asked to check blood pressure one times a week and give me a report on that.  Patient will be follow-up in 3 months   or earlier as needed, patient will call me back for any change in the cardiovascular symptoms Patient was advised to buy a book from local bookstore concerning blood pressure and read several chapters  every day.  This will be supplemented by some of the material we will give him from the office.  Patient should also utilize other resources like YouTube and Internet to learn more about the blood pressure and the diet.        Respiratory   Allergic rhinitis    Take Claritin 10 mg as needed        Digestive   GERD (gastroesophageal reflux disease)    Patient educated extensively on acid reflux lifestyle modification, including buying a bed wedge, not eating 3 hrs before bedtime, diet modifications, and handout given for the same.         Endocrine   Type 2 diabetes mellitus without complication, without long-term current use of insulin ([place])    - The patient's blood sugar is labile on med. - The patient will continue the current treatment regimen.  - I encouraged the patient to regularly check blood sugar.  - I encouraged the patient to monitor diet. I encouraged the patient to eat low-carb and low-sugar to help prevent blood sugar spikes.  - I encouraged the patient to continue following their prescribed  treatment plan for diabetes - I informed the patient to get help if blood sugar drops below 54mg /dL, or if suddenly have trouble thinking clearly or breathing.  Patient was advised to buy a book on diabetes from a local bookstore or from [place]  Patient should read 2 chapters every day to keep the motivation going, this is in addition to some of the materials we provided them from the office.  There are other resources on the Internet like YouTube and wilkipedia to get an education on the diabetes      Relevant Orders   POCT glucose (manual entry) (Completed)     Other   Hypercholesteremia    Hypercholesterolemia  I advised the patient to follow Mediterranean diet This diet is  rich in fruits vegetables and whole grain, and This diet is also rich in fish and lean meat Patient should also eat a handful of almonds or walnuts daily Recent heart study indicated that average follow-up on this kind of diet reduces the cardiovascular mortality by 50 to 70%==      Bilateral wrist pain    Refer to Ortho      Other Visit Diagnoses     Need for influenza vaccination    -  Primary   Relevant Orders   Flu Vaccine QUAD High Dose(Fluad) (Completed)   Need for Tdap vaccination       Relevant Orders   Tdap vaccine greater than or equal to 7yo IM (Completed)       No orders of the defined types were placed in this encounter.   Follow-up: No follow-ups on file.    Cletis Athens, MD

## 2021-07-08 NOTE — Assessment & Plan Note (Signed)
Hypercholesterolemia  I advised the patient to follow Mediterranean diet This diet is rich in fruits vegetables and whole grain, and This diet is also rich in fish and lean meat Patient should also eat a handful of almonds or walnuts daily Recent heart study indicated that average follow-up on this kind of diet reduces the cardiovascular mortality by 50 to 70%== 

## 2021-07-08 NOTE — Assessment & Plan Note (Signed)
Refer to Ortho?

## 2021-07-09 ENCOUNTER — Ambulatory Visit
Admission: RE | Admit: 2021-07-09 | Discharge: 2021-07-09 | Disposition: A | Discharge status: Home / Self Care | Payer: PPO | Source: Ambulatory Visit | Attending: Internal Medicine | Admitting: Internal Medicine

## 2021-07-09 DIAGNOSIS — Z1231 Encounter for screening mammogram for malignant neoplasm of breast: Secondary | ICD-10-CM | POA: Insufficient documentation

## 2021-09-22 ENCOUNTER — Other Ambulatory Visit: Payer: Self-pay

## 2021-09-22 ENCOUNTER — Ambulatory Visit (INDEPENDENT_AMBULATORY_CARE_PROVIDER_SITE_OTHER): Payer: PPO | Admitting: Internal Medicine

## 2021-09-22 ENCOUNTER — Encounter: Payer: Self-pay | Admitting: Internal Medicine

## 2021-09-22 VITALS — BP 128/65 | HR 64 | Ht 63.0 in | Wt 182.9 lb

## 2021-09-22 DIAGNOSIS — K219 Gastro-esophageal reflux disease without esophagitis: Secondary | ICD-10-CM | POA: Diagnosis not present

## 2021-09-22 DIAGNOSIS — I10 Essential (primary) hypertension: Secondary | ICD-10-CM | POA: Diagnosis not present

## 2021-09-22 DIAGNOSIS — J309 Allergic rhinitis, unspecified: Secondary | ICD-10-CM | POA: Diagnosis not present

## 2021-09-22 DIAGNOSIS — E119 Type 2 diabetes mellitus without complications: Secondary | ICD-10-CM

## 2021-09-22 DIAGNOSIS — E7800 Pure hypercholesterolemia, unspecified: Secondary | ICD-10-CM

## 2021-09-22 DIAGNOSIS — L309 Dermatitis, unspecified: Secondary | ICD-10-CM

## 2021-09-22 LAB — GLUCOSE, POCT (MANUAL RESULT ENTRY): POC Glucose: 124 mg/dl — AB (ref 70–99)

## 2021-09-22 NOTE — Assessment & Plan Note (Signed)
-   The patient's GERD is stable on medication.  - Instructed the patient to avoid eating spicy and acidic foods, as well as foods high in fat. - Instructed the patient to avoid eating large meals or meals 2-3 hours prior to sleeping. 

## 2021-09-22 NOTE — Assessment & Plan Note (Signed)

## 2021-09-22 NOTE — Assessment & Plan Note (Signed)
Take Claritin 5 mg daily

## 2021-09-22 NOTE — Assessment & Plan Note (Signed)
Hypercholesterolemia  I advised the patient to follow Mediterranean diet This diet is rich in fruits vegetables and whole grain, and This diet is also rich in fish and lean meat Patient should also eat a handful of almonds or walnuts daily Recent heart study indicated that average follow-up on this kind of diet reduces the cardiovascular mortality by 50 to 70%== 

## 2021-09-22 NOTE — Assessment & Plan Note (Signed)

## 2021-09-22 NOTE — Progress Notes (Signed)
Established Patient Office Visit  Subjective:  Patient ID: Anne Parrish, female    DOB: 06/30/1951  Age: 71 y.o. MRN: [id number]  CC:  Chief Complaint  Patient presents with   Follow-up    HPI  Anne Parrish presents for general check up  Past Medical History:  Diagnosis Date   Basal cell carcinoma 07/17/2020   R lat canthus/lower eyelid ED&C    Diabetes mellitus without complication ([place])    Hyperlipidemia    Hypertension     Past Surgical History:  Procedure Laterality Date   APPENDECTOMY  1960's   BREAST BIOPSY Right 02/28/2018    STROMAL FIBROSIS, 5 MM, AND LOBULAR INVOLUTION.   COLONOSCOPY WITH PROPOFOL N/A 04/15/2016   Procedure: COLONOSCOPY WITH PROPOFOL;  Surgeon: Manya Silvas, MD;  Location: [facility] ENDOSCOPY;  Service: Endoscopy;  Laterality: N/A;   COLONOSCOPY WITH PROPOFOL N/A 05/27/2020   Procedure: COLONOSCOPY WITH PROPOFOL;  Surgeon: Lucilla Lame, MD;  Location: [facility] ENDOSCOPY;  Service: Endoscopy;  Laterality: N/A;   REFRACTIVE SURGERY     TONSILLECTOMY AND ADENOIDECTOMY  1975    Family History  Problem Relation Age of Onset   Cancer Mother        lung   Thyroid disease Mother    Cancer Father        prostate   Heart disease Father    Cancer Brother        prostate   Breast cancer Neg Hx     Social History   Socioeconomic History   Marital status: Divorced    Spouse name: Not on file   Number of children: 2   Years of education: Not on file   Highest education level: Some college, no degree  Occupational History   Occupation: retired   Occupation: owns Engineer, building services  Tobacco Use   Smoking status: Never   Smokeless tobacco: Never  Scientific laboratory technician Use: Never used  Substance and Sexual Activity   Alcohol use: Not Currently   Drug use: No   Sexual activity: Not Currently  Other Topics Concern   Not on file  Social History Narrative   Not on file   Social Determinants of Health   Financial Resource Strain: Low Risk     Difficulty of Paying Living Expenses: Not hard at all  Food Insecurity: No Food Insecurity   Worried About Charity fundraiser in the Last Year: Never true   Arboriculturist in the Last Year: Never true  Transportation Needs: No Transportation Needs   Lack of Transportation (Medical): No   Lack of Transportation (Non-Medical): No  Physical Activity: Sufficiently Active   Days of Exercise per Week: 7 days   Minutes of Exercise per Session: 30 min  Stress: No Stress Concern Present   Feeling of Stress : Only a little  Social Connections: Moderately Integrated   Frequency of Communication with Friends and Family: Three times a week   Frequency of Social Gatherings with Friends and Family: Three times a week   Attends Religious Services: More than 4 times per year   Active Member of Clubs or Organizations: No   Attends Archivist Meetings: Never   Marital Status: Living with partner  Intimate Partner Violence: Not At Risk   Fear of Current or Ex-Partner: No   Emotionally Abused: No   Physically Abused: No   Sexually Abused: No     Current Outpatient Medications:    acetaminophen (TYLENOL) 650  MG CR tablet, Take 650 mg by mouth every 8 (eight) hours as needed for pain., Disp: , Rfl:    amLODipine (NORVASC) 5 MG tablet, TAKE ONE TABLET BY MOUTH EVERY DAY, Disp: 90 tablet, Rfl: 3   Bioflavonoid Products (BIOFLEX PO), Take by mouth daily. , Disp: , Rfl:    glucose blood (ONETOUCH VERIO) test strip, To check blood sugar once a day.  DX: E11.9, Disp: 100 each, Rfl: 3   lisinopril-hydrochlorothiazide (ZESTORETIC) 10-12.5 MG tablet, TAKE 1 TABLET BY MOUTH DAILY., Disp: 90 tablet, Rfl: 3   Multiple Vitamin (MULTIVITAMIN) capsule, Take 1 capsule by mouth daily., Disp: , Rfl:    omeprazole (PRILOSEC) 20 MG capsule, TAKE 1 CAPSULE EVERY DAY, Disp: 90 capsule, Rfl: 3   ONETOUCH DELICA LANCETS FINE MISC, To check blood sugars once a day.  DX:  E11.9, Disp: 100 each, Rfl: 1   pioglitazone  (ACTOS) 15 MG tablet, TAKE 1 TABLET BY MOUTH DAILY., Disp: 30 tablet, Rfl: 6   Allergies  Allergen Reactions   Adhesive [Tape]    Morphine And Related Itching   Other Other (See Comments)   Zolpidem    Benzonatate Rash   Levofloxacin Rash    ROS Review of Systems  Constitutional: Negative.   HENT: Negative.    Eyes: Negative.   Respiratory: Negative.    Cardiovascular: Negative.   Gastrointestinal: Negative.   Endocrine: Negative.   Genitourinary: Negative.   Musculoskeletal: Negative.   Skin: Negative.   Allergic/Immunologic: Negative.   Neurological: Negative.   Hematological: Negative.   Psychiatric/Behavioral: Negative.    All other systems reviewed and are negative.    Objective:    Physical Exam Vitals reviewed.  Constitutional:      Appearance: Normal appearance.  HENT:     Mouth/Throat:     Mouth: Mucous membranes are moist.  Eyes:     Pupils: Pupils are equal, round, and reactive to light.  Neck:     Vascular: No carotid bruit.  Cardiovascular:     Rate and Rhythm: Normal rate and regular rhythm.     Pulses: Normal pulses.     Heart sounds: Normal heart sounds.  Pulmonary:     Effort: Pulmonary effort is normal.     Breath sounds: Normal breath sounds.  Abdominal:     General: Bowel sounds are normal.     Palpations: Abdomen is soft. There is no hepatomegaly, splenomegaly or mass.     Tenderness: There is no abdominal tenderness.     Hernia: No hernia is present.  Musculoskeletal:        General: No tenderness.     Cervical back: Neck supple.     Right lower leg: No edema.     Left lower leg: No edema.  Skin:    Findings: No rash.  Neurological:     Mental Status: She is alert and oriented to person, place, and time.     Motor: No weakness.  Psychiatric:        Mood and Affect: Mood and affect normal.        Behavior: Behavior normal.    BP 128/65    Pulse 64    Ht 5\' 3"  (1.6 m)    Wt 182 lb 14.4 oz (83 kg)    BMI 32.40 kg/m  Wt  Readings from Last 3 Encounters:  09/22/21 182 lb 14.4 oz (83 kg)  07/08/21 178 lb 14.4 oz (81.1 kg)  07/18/20 179 lb 9.6 oz (81.5 kg)  Health Maintenance Due  Topic Date Due   Zoster Vaccines- Shingrix (1 of 2) Never done   HEMOGLOBIN A1C  10/31/2019   FOOT EXAM  04/29/2020   COVID-19 Vaccine (4 - Booster for Pfizer series) 09/12/2020   DEXA SCAN  01/18/2021    There are no preventive care reminders to display for this patient.  Lab Results  Component Value Date   TSH 1.42 04/11/2020   Lab Results  Component Value Date   WBC 3.9 04/11/2020   HGB 12.2 04/11/2020   HCT 36.2 04/11/2020   MCV 89.4 04/11/2020   PLT 251 04/11/2020   Lab Results  Component Value Date   NA 138 04/11/2020   K 4.6 04/11/2020   CO2 27 04/11/2020   GLUCOSE 138 (H) 04/11/2020   BUN 16 04/11/2020   CREATININE 0.96 (H) 04/11/2020   BILITOT 0.9 04/11/2020   ALKPHOS 71 04/19/2018   AST 22 04/11/2020   ALT 13 04/11/2020   PROT 6.9 04/11/2020   ALBUMIN 4.4 04/19/2018   CALCIUM 9.8 04/11/2020   ANIONGAP 11 03/23/2016   Lab Results  Component Value Date   CHOL 140 04/19/2018   Lab Results  Component Value Date   HDL 51 04/19/2018   Lab Results  Component Value Date   LDLCALC 68 04/19/2018   Lab Results  Component Value Date   TRIG 106 04/19/2018   Lab Results  Component Value Date   CHOLHDL 2.7 04/19/2018   Lab Results  Component Value Date   HGBA1C 7.2 (H) 04/19/2018      Assessment & Plan:   Problem List Items Addressed This Visit       Cardiovascular and Mediastinum   BP (high blood pressure)     Patient denies any chest pain or shortness of breath there is no history of palpitation or paroxysmal nocturnal dyspnea   patient was advised to follow low-salt low-cholesterol diet    ideally I want to keep systolic blood pressure below 130 mmHg, patient was asked to check blood pressure one times a week and give me a report on that.  Patient will be follow-up in 3 months   or earlier as needed, patient will call me back for any change in the cardiovascular symptoms Patient was advised to buy a book from local bookstore concerning blood pressure and read several chapters  every day.  This will be supplemented by some of the material we will give him from the office.  Patient should also utilize other resources like YouTube and Internet to learn more about the blood pressure and the diet.        Respiratory   Allergic rhinitis    Take Claritin 5 mg daily        Digestive   GERD (gastroesophageal reflux disease)    - The patient's GERD is stable on medication.  - Instructed the patient to avoid eating spicy and acidic foods, as well as foods high in fat. - Instructed the patient to avoid eating large meals or meals 2-3 hours prior to sleeping.        Endocrine   Type 2 diabetes mellitus without complication, without long-term current use of insulin ([place]) - Primary    - The patient's blood sugar is labile on med. - The patient will continue the current treatment regimen.  - I encouraged the patient to regularly check blood sugar.  - I encouraged the patient to monitor diet. I encouraged the patient to eat low-carb and low-sugar to  help prevent blood sugar spikes.  - I encouraged the patient to continue following their prescribed treatment plan for diabetes - I informed the patient to get help if blood sugar drops below 54mg /dL, or if suddenly have trouble thinking clearly or breathing.  Patient was advised to buy a book on diabetes from a local bookstore or from [place]  Patient should read 2 chapters every day to keep the motivation going, this is in addition to some of the materials we provided them from the office.  There are other resources on the Internet like YouTube and wilkipedia to get an education on the diabetes      Relevant Orders   POCT glucose (manual entry) (Completed)     Musculoskeletal and Integument   Dermatitis     Other    Hypercholesteremia    Hypercholesterolemia  I advised the patient to follow Mediterranean diet This diet is rich in fruits vegetables and whole grain, and This diet is also rich in fish and lean meat Patient should also eat a handful of almonds or walnuts daily Recent heart study indicated that average follow-up on this kind of diet reduces the cardiovascular mortality by 50 to 70%==       No orders of the defined types were placed in this encounter.   Follow-up: No follow-ups on file.    Cletis Athens, MD

## 2021-12-21 ENCOUNTER — Ambulatory Visit: Payer: PPO | Admitting: Internal Medicine

## 2021-12-29 ENCOUNTER — Encounter: Payer: Self-pay | Admitting: Internal Medicine

## 2021-12-29 ENCOUNTER — Ambulatory Visit (INDEPENDENT_AMBULATORY_CARE_PROVIDER_SITE_OTHER): Payer: PPO | Admitting: Internal Medicine

## 2021-12-29 VITALS — BP 179/68 | HR 64 | Ht 63.0 in | Wt 187.8 lb

## 2021-12-29 DIAGNOSIS — E119 Type 2 diabetes mellitus without complications: Secondary | ICD-10-CM

## 2021-12-29 DIAGNOSIS — L309 Dermatitis, unspecified: Secondary | ICD-10-CM

## 2021-12-29 DIAGNOSIS — I10 Essential (primary) hypertension: Secondary | ICD-10-CM | POA: Diagnosis not present

## 2021-12-29 DIAGNOSIS — E7800 Pure hypercholesterolemia, unspecified: Secondary | ICD-10-CM | POA: Diagnosis not present

## 2021-12-29 LAB — GLUCOSE, POCT (MANUAL RESULT ENTRY): POC Glucose: 120 mg/dl — AB (ref 70–99)

## 2021-12-29 MED ORDER — LISINOPRIL-HYDROCHLOROTHIAZIDE 20-12.5 MG PO TABS: 1.0000 | ORAL_TABLET | Freq: Every day | ORAL | 3 refills | Status: DC

## 2021-12-29 MED ORDER — AMLODIPINE BESYLATE 5 MG PO TABS: 5.0000 mg | ORAL_TABLET | Freq: Every day | ORAL | 3 refills | Status: DC

## 2021-12-29 NOTE — Assessment & Plan Note (Signed)

## 2021-12-29 NOTE — Assessment & Plan Note (Signed)
Dermatitis is under control ?I told the patient to use moisturizing cream ?, drink a lot of water ?

## 2021-12-29 NOTE — Assessment & Plan Note (Signed)

## 2021-12-29 NOTE — Assessment & Plan Note (Signed)
Hypercholesterolemia  I advised the patient to follow Mediterranean diet This diet is rich in fruits vegetables and whole grain, and This diet is also rich in fish and lean meat Patient should also eat a handful of almonds or walnuts daily Recent heart study indicated that average follow-up on this kind of diet reduces the cardiovascular mortality by 50 to 70%== 

## 2021-12-29 NOTE — Progress Notes (Signed)
? ?Established Patient Office Visit ? ?Subjective:  ?Patient ID: Anne Parrish, female    DOB: Nov 11, 1950  Age: 71 y.o. MRN: [id number] ? ?CC:  ?Chief Complaint  ?Patient presents with  ? Diabetes  ?  3 month follow up ?  ? Hypertension  ? ? ?Diabetes ? ?Hypertension ? ? ?Anne Parrish presents for general check up ?Past Medical History:  ?Diagnosis Date  ? Basal cell carcinoma 07/17/2020  ? R lat canthus/lower eyelid ED&C   ? Diabetes mellitus without complication ([place])   ? Hyperlipidemia   ? Hypertension   ? ? ?Past Surgical History:  ?Procedure Laterality Date  ? APPENDECTOMY  1960's  ? BREAST BIOPSY Right 02/28/2018  ?  STROMAL FIBROSIS, 5 MM, AND LOBULAR INVOLUTION.  ? COLONOSCOPY WITH PROPOFOL N/A 04/15/2016  ? Procedure: COLONOSCOPY WITH PROPOFOL;  Surgeon: Manya Silvas, MD;  Location: [facility] ENDOSCOPY;  Service: Endoscopy;  Laterality: N/A;  ? COLONOSCOPY WITH PROPOFOL N/A 05/27/2020  ? Procedure: COLONOSCOPY WITH PROPOFOL;  Surgeon: Lucilla Lame, MD;  Location: [facility] ENDOSCOPY;  Service: Endoscopy;  Laterality: N/A;  ? REFRACTIVE SURGERY    ? TONSILLECTOMY AND ADENOIDECTOMY  1975  ? ? ?Family History  ?Problem Relation Age of Onset  ? Cancer Mother   ?     lung  ? Thyroid disease Mother   ? Cancer Father   ?     prostate  ? Heart disease Father   ? Cancer Brother   ?     prostate  ? Breast cancer Neg Hx   ? ? ?Social History  ? ?Socioeconomic History  ? Marital status: Divorced  ?  Spouse name: Not on file  ? Number of children: 2  ? Years of education: Not on file  ? Highest education level: Some college, no degree  ?Occupational History  ? Occupation: retired  ? Occupation: owns rental properties  ?Tobacco Use  ? Smoking status: Never  ? Smokeless tobacco: Never  ?Vaping Use  ? Vaping Use: Never used  ?Substance and Sexual Activity  ? Alcohol use: Not Currently  ? Drug use: No  ? Sexual activity: Not Currently  ?Other Topics Concern  ? Not on file  ?Social History Narrative  ? Not on file  ? ?Social  Determinants of Health  ? ?Financial Resource Strain: Low Risk   ? Difficulty of Paying Living Expenses: Not hard at all  ?Food Insecurity: No Food Insecurity  ? Worried About Charity fundraiser in the Last Year: Never true  ? Ran Out of Food in the Last Year: Never true  ?Transportation Needs: No Transportation Needs  ? Lack of Transportation (Medical): No  ? Lack of Transportation (Non-Medical): No  ?Physical Activity: Sufficiently Active  ? Days of Exercise per Week: 7 days  ? Minutes of Exercise per Session: 30 min  ?Stress: No Stress Concern Present  ? Feeling of Stress : Only a little  ?Social Connections: Moderately Integrated  ? Frequency of Communication with Friends and Family: Three times a week  ? Frequency of Social Gatherings with Friends and Family: Three times a week  ? Attends Religious Services: More than 4 times per year  ? Active Member of Clubs or Organizations: No  ? Attends Archivist Meetings: Never  ? Marital Status: Living with partner  ?Intimate Partner Violence: Not At Risk  ? Fear of Current or Ex-Partner: No  ? Emotionally Abused: No  ? Physically Abused: No  ? Sexually Abused: No  ? ? ? ?  Current Outpatient Medications:  ?  acetaminophen (TYLENOL) 650 MG CR tablet, Take 650 mg by mouth every 8 (eight) hours as needed for pain., Disp: , Rfl:  ?  Bioflavonoid Products (BIOFLEX PO), Take by mouth daily. , Disp: , Rfl:  ?  glucose blood (ONETOUCH VERIO) test strip, To check blood sugar once a day.  DX: E11.9, Disp: 100 each, Rfl: 3 ?  Multiple Vitamin (MULTIVITAMIN) capsule, Take 1 capsule by mouth daily., Disp: , Rfl:  ?  omeprazole (PRILOSEC) 20 MG capsule, TAKE 1 CAPSULE EVERY DAY, Disp: 90 capsule, Rfl: 3 ?  ONETOUCH DELICA LANCETS FINE MISC, To check blood sugars once a day.  DX:  E11.9, Disp: 100 each, Rfl: 1 ?  pioglitazone (ACTOS) 15 MG tablet, TAKE 1 TABLET BY MOUTH DAILY., Disp: 30 tablet, Rfl: 6 ?  amLODipine (NORVASC) 5 MG tablet, Take 1 tablet (5 mg total) by mouth  daily., Disp: 90 tablet, Rfl: 3  ? ?Allergies  ?Allergen Reactions  ? Adhesive [Tape]   ? Morphine And Related Itching  ? Other Other (See Comments)  ? Zolpidem   ? Benzonatate Rash  ? Levofloxacin Rash  ? ? ?ROS ?Review of Systems  ?Constitutional: Negative.   ?HENT: Negative.    ?Eyes: Negative.   ?Respiratory: Negative.    ?Cardiovascular: Negative.   ?Gastrointestinal: Negative.   ?Endocrine: Negative.   ?Genitourinary: Negative.   ?Musculoskeletal: Negative.   ?Skin: Negative.   ?Allergic/Immunologic: Negative.   ?Neurological: Negative.   ?Hematological: Negative.   ?Psychiatric/Behavioral: Negative.    ?All other systems reviewed and are negative. ? ?  ?Objective:  ?  ?Physical Exam ?Vitals reviewed.  ?Constitutional:   ?   Appearance: Normal appearance.  ?HENT:  ?   Mouth/Throat:  ?   Mouth: Mucous membranes are moist.  ?Eyes:  ?   Pupils: Pupils are equal, round, and reactive to light.  ?Neck:  ?   Vascular: No carotid bruit.  ?Cardiovascular:  ?   Rate and Rhythm: Normal rate and regular rhythm.  ?   Pulses: Normal pulses.  ?   Heart sounds: Normal heart sounds.  ?Pulmonary:  ?   Effort: Pulmonary effort is normal.  ?   Breath sounds: Normal breath sounds.  ?Abdominal:  ?   General: Bowel sounds are normal.  ?   Palpations: Abdomen is soft. There is no hepatomegaly, splenomegaly or mass.  ?   Tenderness: There is no abdominal tenderness.  ?   Hernia: No hernia is present.  ?Musculoskeletal:     ?   General: No tenderness.  ?   Cervical back: Neck supple.  ?   Right lower leg: No edema.  ?   Left lower leg: No edema.  ?Skin: ?   Findings: No rash.  ?Neurological:  ?   Mental Status: She is alert and oriented to person, place, and time.  ?   Motor: No weakness.  ?Psychiatric:     ?   Mood and Affect: Mood and affect normal.     ?   Behavior: Behavior normal.  ? ? ?BP (!) 179/68   Pulse 64   Ht '5\' 3"'$  (1.6 m)   Wt 187 lb 12.8 oz (85.2 kg)   BMI 33.27 kg/m?  ?Wt Readings from Last 3 Encounters:  ?12/29/21  187 lb 12.8 oz (85.2 kg)  ?09/22/21 182 lb 14.4 oz (83 kg)  ?07/08/21 178 lb 14.4 oz (81.1 kg)  ? ? ? ?Health Maintenance Due  ?Topic Date Due  ?  URINE MICROALBUMIN  Never done  ? Zoster Vaccines- Shingrix (1 of 2) Never done  ? HEMOGLOBIN A1C  10/31/2019  ? FOOT EXAM  04/29/2020  ? COVID-19 Vaccine (4 - Booster for [place] series) 09/12/2020  ? DEXA SCAN  01/18/2021  ? ? ?There are no preventive care reminders to display for this patient. ? ?Lab Results  ?Component Value Date  ? TSH 1.42 04/11/2020  ? ?Lab Results  ?Component Value Date  ? WBC 3.9 04/11/2020  ? HGB 12.2 04/11/2020  ? HCT 36.2 04/11/2020  ? MCV 89.4 04/11/2020  ? PLT 251 04/11/2020  ? ?Lab Results  ?Component Value Date  ? NA 138 04/11/2020  ? K 4.6 04/11/2020  ? CO2 27 04/11/2020  ? GLUCOSE 138 (H) 04/11/2020  ? BUN 16 04/11/2020  ? CREATININE 0.96 (H) 04/11/2020  ? BILITOT 0.9 04/11/2020  ? ALKPHOS 71 04/19/2018  ? AST 22 04/11/2020  ? ALT 13 04/11/2020  ? PROT 6.9 04/11/2020  ? ALBUMIN 4.4 04/19/2018  ? CALCIUM 9.8 04/11/2020  ? ANIONGAP 11 03/23/2016  ? ?Lab Results  ?Component Value Date  ? CHOL 140 04/19/2018  ? ?Lab Results  ?Component Value Date  ? HDL 51 04/19/2018  ? ?Lab Results  ?Component Value Date  ? [place] 68 04/19/2018  ? ?Lab Results  ?Component Value Date  ? TRIG 106 04/19/2018  ? ?Lab Results  ?Component Value Date  ? CHOLHDL 2.7 04/19/2018  ? ?Lab Results  ?Component Value Date  ? HGBA1C 7.2 (H) 04/19/2018  ? ? ?  ?Assessment & Plan:  ? ?Problem List Items Addressed This Visit   ? ?  ? Cardiovascular and Mediastinum  ? BP (high blood pressure)  ?   Patient denies any chest pain or shortness of breath there is no history of palpitation or paroxysmal nocturnal dyspnea ?  patient was advised to follow low-salt low-cholesterol diet ? ?  ideally I want to keep systolic blood pressure below 130 mmHg, patient was asked to check blood pressure one times a week and give me a report on that.  Patient will be follow-up in 3 months  or  earlier as needed, patient will call me back for any change in the cardiovascular symptoms ?Patient was advised to buy a book from local bookstore concerning blood pressure and read several chapters  every day.  This wil

## 2022-02-22 ENCOUNTER — Encounter: Payer: Self-pay | Admitting: Internal Medicine

## 2022-02-22 ENCOUNTER — Ambulatory Visit (INDEPENDENT_AMBULATORY_CARE_PROVIDER_SITE_OTHER): Payer: PPO | Admitting: Internal Medicine

## 2022-02-22 VITALS — BP 140/60 | HR 69 | Ht 63.0 in | Wt 183.4 lb

## 2022-02-22 DIAGNOSIS — E7800 Pure hypercholesterolemia, unspecified: Secondary | ICD-10-CM | POA: Diagnosis not present

## 2022-02-22 DIAGNOSIS — I10 Essential (primary) hypertension: Secondary | ICD-10-CM | POA: Diagnosis not present

## 2022-02-22 DIAGNOSIS — E119 Type 2 diabetes mellitus without complications: Secondary | ICD-10-CM | POA: Diagnosis not present

## 2022-02-22 DIAGNOSIS — J309 Allergic rhinitis, unspecified: Secondary | ICD-10-CM | POA: Diagnosis not present

## 2022-02-22 LAB — GLUCOSE, POCT (MANUAL RESULT ENTRY): POC Glucose: 138 mg/dl — AB (ref 70–99)

## 2022-02-22 MED ORDER — LISINOPRIL-HYDROCHLOROTHIAZIDE 20-12.5 MG PO TABS: 1.0000 | ORAL_TABLET | Freq: Every day | ORAL | 3 refills | Status: DC

## 2022-02-22 NOTE — Assessment & Plan Note (Signed)
Take Claritin 5 mg p.o. daily 

## 2022-02-22 NOTE — Assessment & Plan Note (Signed)

## 2022-02-22 NOTE — Assessment & Plan Note (Signed)
Hypercholesterolemia  I advised the patient to follow Mediterranean diet This diet is rich in fruits vegetables and whole grain, and This diet is also rich in fish and lean meat Patient should also eat a handful of almonds or walnuts daily Recent heart study indicated that average follow-up on this kind of diet reduces the cardiovascular mortality by 50 to 70%== 

## 2022-02-22 NOTE — Progress Notes (Signed)
Established Patient Office Visit  Subjective:  Patient ID: Anne Parrish, female    DOB: [birth year]  Age: 71 y.o. MRN: [id number]  CC:  Chief Complaint  Patient presents with   Diabetes    3 month follow up   Hypertension    3 month follow up    Diabetes  Hypertension    Anne Parrish presents for hbp  Past Medical History:  Diagnosis Date   Basal cell carcinoma 07/17/2020   R lat canthus/lower eyelid ED&C    Diabetes mellitus without complication ([place])    Hyperlipidemia    Hypertension     Past Surgical History:  Procedure Laterality Date   APPENDECTOMY  1960's   BREAST BIOPSY Right 02/28/2018    STROMAL FIBROSIS, 5 MM, AND LOBULAR INVOLUTION.   COLONOSCOPY WITH PROPOFOL N/A 04/15/2016   Procedure: COLONOSCOPY WITH PROPOFOL;  Surgeon: Manya Silvas, MD;  Location: [facility] ENDOSCOPY;  Service: Endoscopy;  Laterality: N/A;   COLONOSCOPY WITH PROPOFOL N/A 05/27/2020   Procedure: COLONOSCOPY WITH PROPOFOL;  Surgeon: Lucilla Lame, MD;  Location: [facility] ENDOSCOPY;  Service: Endoscopy;  Laterality: N/A;   REFRACTIVE SURGERY     TONSILLECTOMY AND ADENOIDECTOMY  1975    Family History  Problem Relation Age of Onset   Cancer Mother        lung   Thyroid disease Mother    Cancer Father        prostate   Heart disease Father    Cancer Brother        prostate   Breast cancer Neg Hx     Social History   Socioeconomic History   Marital status: Divorced    Spouse name: Not on file   Number of children: 2   Years of education: Not on file   Highest education level: Some college, no degree  Occupational History   Occupation: retired   Occupation: owns Engineer, building services  Tobacco Use   Smoking status: Never   Smokeless tobacco: Never  Vaping Use   Vaping Use: Never used  Substance and Sexual Activity   Alcohol use: Not Currently   Drug use: No   Sexual activity: Not Currently  Other Topics Concern   Not on file  Social History Narrative   Not on file   Social  Determinants of Health   Financial Resource Strain: Low Risk  (06/05/2021)   Overall Financial Resource Strain (CARDIA)    Difficulty of Paying Living Expenses: Not hard at all  Food Insecurity: No [place] (06/05/2021)   Hunger Vital Sign    Worried About Running Out of Food in the Last Year: Never true    [place] in the Last Year: Never true  Transportation Needs: No Transportation Needs (06/05/2021)   PRAPARE - Hydrologist (Medical): No    Lack of Transportation (Non-Medical): No  Physical Activity: Sufficiently Active (06/05/2021)   Exercise Vital Sign    Days of Exercise per Week: 7 days    Minutes of Exercise per Session: 30 min  Stress: No Stress Concern Present (06/05/2021)   [place]    Feeling of Stress : Only a little  Social Connections: Moderately Integrated (06/05/2021)   Social Connection and Isolation Panel [NHANES]    Frequency of Communication with Friends and Family: Three times a week    Frequency of Social Gatherings with Friends and Family: Three times a week  Attends Religious Services: More than 4 times per year    Active Member of Clubs or Organizations: No    Attends Archivist Meetings: Never    Marital Status: Living with partner  Intimate Partner Violence: Not At Risk (06/05/2021)   Humiliation, Afraid, Rape, and Kick questionnaire    Fear of Current or Ex-Partner: No    Emotionally Abused: No    Physically Abused: No    Sexually Abused: No     Current Outpatient Medications:    acetaminophen (TYLENOL) 650 MG CR tablet, Take 650 mg by mouth every 8 (eight) hours as needed for pain., Disp: , Rfl:    amLODipine (NORVASC) 5 MG tablet, Take 1 tablet (5 mg total) by mouth daily., Disp: 90 tablet, Rfl: 3   Bioflavonoid Products (BIOFLEX PO), Take by mouth daily. , Disp: , Rfl:    glucose blood (ONETOUCH VERIO) test strip, To check blood  sugar once a day.  DX: E11.9, Disp: 100 each, Rfl: 3   Multiple Vitamin (MULTIVITAMIN) capsule, Take 1 capsule by mouth daily., Disp: , Rfl:    omeprazole (PRILOSEC) 20 MG capsule, TAKE 1 CAPSULE EVERY DAY, Disp: 90 capsule, Rfl: 3   ONETOUCH DELICA LANCETS FINE MISC, To check blood sugars once a day.  DX:  E11.9, Disp: 100 each, Rfl: 1   pioglitazone (ACTOS) 15 MG tablet, TAKE 1 TABLET BY MOUTH DAILY., Disp: 30 tablet, Rfl: 6   lisinopril-hydrochlorothiazide (ZESTORETIC) 20-12.5 MG tablet, Take 1 tablet by mouth daily., Disp: 90 tablet, Rfl: 3   Allergies  Allergen Reactions   Adhesive [Tape]    Morphine And Related Itching   Other Other (See Comments)   Zolpidem    Benzonatate Rash   Levofloxacin Rash    ROS Review of Systems  Constitutional: Negative.   HENT: Negative.    Eyes: Negative.   Respiratory: Negative.    Cardiovascular: Negative.   Gastrointestinal: Negative.   Endocrine: Negative.   Genitourinary: Negative.   Musculoskeletal: Negative.   Skin: Negative.   Allergic/Immunologic: Negative.   Neurological: Negative.   Hematological: Negative.   Psychiatric/Behavioral: Negative.    All other systems reviewed and are negative.     Objective:    Physical Exam Vitals reviewed.  HENT:     Head: Normocephalic.  Cardiovascular:     Rate and Rhythm: Normal rate.  Pulmonary:     Effort: Pulmonary effort is normal.  Abdominal:     Palpations: Abdomen is soft.  Musculoskeletal:        General: Normal range of motion.  Skin:    General: Skin is warm.  Neurological:     General: No focal deficit present.     BP 140/60   Pulse 69   Ht '5\' 3"'$  (1.6 m)   Wt 183 lb 6.4 oz (83.2 kg)   BMI 32.49 kg/m  Wt Readings from Last 3 Encounters:  02/22/22 183 lb 6.4 oz (83.2 kg)  12/29/21 187 lb 12.8 oz (85.2 kg)  09/22/21 182 lb 14.4 oz (83 kg)     Health Maintenance Due  Topic Date Due   Zoster Vaccines- Shingrix (1 of 2) Never done   HEMOGLOBIN A1C   10/31/2019   FOOT EXAM  04/29/2020   COVID-19 Vaccine (4 - Booster for Pfizer series) 09/12/2020   DEXA SCAN  01/18/2021    There are no preventive care reminders to display for this patient.  Lab Results  Component Value Date   TSH 1.42 04/11/2020  Lab Results  Component Value Date   WBC 3.9 04/11/2020   HGB 12.2 04/11/2020   HCT 36.2 04/11/2020   MCV 89.4 04/11/2020   PLT 251 04/11/2020   Lab Results  Component Value Date   NA 138 04/11/2020   K 4.6 04/11/2020   CO2 27 04/11/2020   GLUCOSE 138 (H) 04/11/2020   BUN 16 04/11/2020   CREATININE 0.96 (H) 04/11/2020   BILITOT 0.9 04/11/2020   ALKPHOS 71 04/19/2018   AST 22 04/11/2020   ALT 13 04/11/2020   PROT 6.9 04/11/2020   ALBUMIN 4.4 04/19/2018   CALCIUM 9.8 04/11/2020   ANIONGAP 11 03/23/2016   Lab Results  Component Value Date   CHOL 140 04/19/2018   Lab Results  Component Value Date   HDL 51 04/19/2018   Lab Results  Component Value Date   LDLCALC 68 04/19/2018   Lab Results  Component Value Date   TRIG 106 04/19/2018   Lab Results  Component Value Date   CHOLHDL 2.7 04/19/2018   Lab Results  Component Value Date   HGBA1C 7.2 (H) 04/19/2018      Assessment & Plan:   Problem List Items Addressed This Visit       Cardiovascular and Mediastinum   BP (high blood pressure)    The following hypertensive lifestyle modification were recommended and discussed:  1. Limiting alcohol intake to less than 1 oz/day of ethanol:(24 oz of beer or 8 oz of wine or 2 oz of 100-proof whiskey). 2. Take baby ASA 81 mg daily. 3. Importance of regular aerobic exercise and losing weight. 4. Reduce dietary saturated fat and cholesterol intake for overall cardiovascular health. 5. Maintaining adequate dietary potassium, calcium, and magnesium intake. 6. Regular monitoring of the blood pressure. 7. Reduce sodium intake to less than 100 mmol/day (less than 2.3 gm of sodium or less than 6 gm of sodium choride)        Relevant Medications   lisinopril-hydrochlorothiazide (ZESTORETIC) 20-12.5 MG tablet     Respiratory   Allergic rhinitis    Take Claritin 5 mg p.o. daily        Endocrine   Type 2 diabetes mellitus without complication, without long-term current use of insulin ([place]) - Primary    - The patient's blood sugar is labile on med. - The patient will continue the current treatment regimen.  - I encouraged the patient to regularly check blood sugar.  - I encouraged the patient to monitor diet. I encouraged the patient to eat low-carb and low-sugar to help prevent blood sugar spikes.  - I encouraged the patient to continue following their prescribed treatment plan for diabetes - I informed the patient to get help if blood sugar drops below '54mg'$ /dL, or if suddenly have trouble thinking clearly or breathing.  Patient was advised to buy a book on diabetes from a local bookstore or from [place]  Patient should read 2 chapters every day to keep the motivation going, this is in addition to some of the materials we provided them from the office.  There are other resources on the Internet like YouTube and wilkipedia to get an education on the diabetes      Relevant Medications   lisinopril-hydrochlorothiazide (ZESTORETIC) 20-12.5 MG tablet   Other Relevant Orders   POCT glucose (manual entry) (Completed)     Other   Hypercholesteremia    Hypercholesterolemia  I advised the patient to follow Mediterranean diet This diet is rich in fruits vegetables and whole grain,  and This diet is also rich in fish and lean meat Patient should also eat a handful of almonds or walnuts daily Recent heart study indicated that average follow-up on this kind of diet reduces the cardiovascular mortality by 50 to 70%==      Relevant Medications   lisinopril-hydrochlorothiazide (ZESTORETIC) 20-12.5 MG tablet   Other Visit Diagnoses     Essential hypertension       Relevant Medications    lisinopril-hydrochlorothiazide (ZESTORETIC) 20-12.5 MG tablet       Meds ordered this encounter  Medications   lisinopril-hydrochlorothiazide (ZESTORETIC) 20-12.5 MG tablet    Sig: Take 1 tablet by mouth daily.    Dispense:  90 tablet    Refill:  3    Follow-up: No follow-ups on file.    Cletis Athens, MD

## 2022-02-22 NOTE — Assessment & Plan Note (Signed)

## 2022-02-24 ENCOUNTER — Other Ambulatory Visit: Payer: Self-pay | Admitting: Internal Medicine

## 2022-02-24 DIAGNOSIS — E119 Type 2 diabetes mellitus without complications: Secondary | ICD-10-CM

## 2022-04-08 ENCOUNTER — Ambulatory Visit: Payer: PPO | Admitting: Dermatology

## 2022-04-08 DIAGNOSIS — Z85828 Personal history of other malignant neoplasm of skin: Secondary | ICD-10-CM

## 2022-04-08 DIAGNOSIS — L820 Inflamed seborrheic keratosis: Secondary | ICD-10-CM | POA: Diagnosis not present

## 2022-04-08 DIAGNOSIS — L920 Granuloma annulare: Secondary | ICD-10-CM

## 2022-04-08 DIAGNOSIS — L570 Actinic keratosis: Secondary | ICD-10-CM | POA: Diagnosis not present

## 2022-04-08 DIAGNOSIS — Z1283 Encounter for screening for malignant neoplasm of skin: Secondary | ICD-10-CM

## 2022-04-08 DIAGNOSIS — L578 Other skin changes due to chronic exposure to nonionizing radiation: Secondary | ICD-10-CM

## 2022-04-08 DIAGNOSIS — D1800 Hemangioma unspecified site: Secondary | ICD-10-CM

## 2022-04-08 DIAGNOSIS — L821 Other seborrheic keratosis: Secondary | ICD-10-CM

## 2022-04-08 DIAGNOSIS — D229 Melanocytic nevi, unspecified: Secondary | ICD-10-CM

## 2022-04-08 MED ORDER — MOMETASONE FUROATE 0.1 % EX CREA: TOPICAL_CREAM | CUTANEOUS | 3 refills | Status: DC

## 2022-04-08 NOTE — Progress Notes (Unsigned)
Follow-Up Visit   Subjective  Anne Parrish is a 71 y.o. female who presents for the following: Annual Exam (Hx of BCC ). The patient presents for Total-Body Skin Exam (TBSE) for skin cancer screening and mole check.  The patient has spots, moles and lesions to be evaluated, some may be new or changing and the patient has concerns that these could be cancer.  The following portions of the chart were reviewed this encounter and updated as appropriate:   Tobacco  Allergies  Meds  Problems  Med Hx  Surg Hx  Fam Hx     Review of Systems:  No other skin or systemic complaints except as noted in HPI or Assessment and Plan.  Objective  Well appearing patient in no apparent distress; mood and affect are within normal limits.  A full examination was performed including scalp, head, eyes, ears, nose, lips, neck, chest, axillae, abdomen, back, buttocks, bilateral upper extremities, bilateral lower extremities, hands, feet, fingers, toes, fingernails, and toenails. All findings within normal limits unless otherwise noted below.  B/L arms, legs, back Arms clear today. Legs and mid back effected. Minimal involvement on the chest and abdomen.      temples Erythematous thin papules/macules with gritty scale.    Assessment & Plan  Granuloma annulare B/L arms, legs, back  Reassured benign condition - discussed treatment options of topical steroids, Plaquenil, Dapsone, and Isotretinoin. Patient will need biopsy before starting oral treatment.  Start Mometasone 0.1% cream to aa's QD-BID PRN. Topical steroids (such as triamcinolone, fluocinolone, fluocinonide, mometasone, clobetasol, halobetasol, betamethasone, hydrocortisone) can cause thinning and lightening of the skin if they are used for too long in the same area. Your physician has selected the right strength medicine for your problem and area affected on the body. Please use your medication only as directed by your physician to prevent  side effects.    mometasone (ELOCON) 0.1 % cream - B/L arms, legs, back Apply to aa's QD-BID PRN up to 5d/wk.  AK (actinic keratosis) temples  Destruction of lesion - temples Complexity: simple   Destruction method: cryotherapy   Informed consent: discussed and consent obtained   Timeout:  patient name, date of birth, surgical site, and procedure verified Lesion destroyed using liquid nitrogen: Yes   Region frozen until ice ball extended beyond lesion: Yes   Outcome: patient tolerated procedure well with no complications   Post-procedure details: wound care instructions given    Skin cancer screening  Actinic skin damage  Lentigines - Scattered tan macules - Due to sun exposure - Benign-appearing, observe - Recommend daily broad spectrum sunscreen SPF 30+ to sun-exposed areas, reapply every 2 hours as needed. - Call for any changes  Seborrheic Keratoses - Stuck-on, waxy, tan-brown papules and/or plaques  - Benign-appearing - Discussed benign etiology and prognosis. - Observe - Call for any changes  Melanocytic Nevi - Tan-brown and/or pink-flesh-colored symmetric macules and papules - Benign appearing on exam today - Observation - Call clinic for new or changing moles - Recommend daily use of broad spectrum spf 30+ sunscreen to sun-exposed areas.   Hemangiomas - Red papules - Discussed benign nature - Observe - Call for any changes  Actinic Damage - Chronic condition, secondary to cumulative UV/sun exposure - diffuse scaly erythematous macules with underlying dyspigmentation - Recommend daily broad spectrum sunscreen SPF 30+ to sun-exposed areas, reapply every 2 hours as needed.  - Staying in the shade or wearing long sleeves, sun glasses (UVA+UVB protection) and wide brim  hats (4-inch brim around the entire circumference of the hat) are also recommended for sun protection.  - Call for new or changing lesions.  History of Basal Cell Carcinoma of the Skin - No  evidence of recurrence today - Recommend regular full body skin exams - Recommend daily broad spectrum sunscreen SPF 30+ to sun-exposed areas, reapply every 2 hours as needed.  - Call if any new or changing lesions are noted between office visits  Skin cancer screening performed today.  Return in about 6 months (around 10/09/2022) for GA f/u.  Luther Redo, CMA, am acting as scribe for Sarina Ser, MD . Documentation: I have reviewed the above documentation for accuracy and completeness, and I agree with the above.  Sarina Ser, MD

## 2022-04-08 NOTE — Patient Instructions (Signed)
Due to recent changes in healthcare laws, you may see results of your pathology and/or laboratory studies on MyChart before the doctors have had a chance to review them. We understand that in some cases there may be results that are confusing or concerning to you. Please understand that not all results are received at the same time and often the doctors may need to interpret multiple results in order to provide you with the best plan of care or course of treatment. Therefore, we ask that you please give us 2 business days to thoroughly review all your results before contacting the office for clarification. Should we see a critical lab result, you will be contacted sooner.   If You Need Anything After Your Visit  If you have any questions or concerns for your doctor, please call our main line at [phone] and press option 4 to reach your doctor's medical assistant. If no one answers, please leave a voicemail as directed and we will return your call as soon as possible. Messages left after 4 pm will be answered the following business day.   You may also send us a message via MyChart. We typically respond to MyChart messages within 1-2 business days.  For prescription refills, please ask your pharmacy to contact our office. Our fax number is [phone].  If you have an urgent issue when the clinic is closed that cannot wait until the next business day, you can page your doctor at the number below.    Please note that while we do our best to be available for urgent issues outside of office hours, we are not available 24/7.   If you have an urgent issue and are unable to reach us, you may choose to seek medical care at your doctor's office, retail clinic, urgent care center, or emergency room.  If you have a medical emergency, please immediately call 911 or go to the emergency department.  Pager Numbers  - Dr. Kowalski: [phone]  - Dr. Moye: [phone]  - Dr. Stewart:  [phone]  In the event of inclement weather, please call our main line at [phone] for an update on the status of any delays or closures.  Dermatology Medication Tips: Please keep the boxes that topical medications come in in order to help keep track of the instructions about where and how to use these. Pharmacies typically print the medication instructions only on the boxes and not directly on the medication tubes.   If your medication is too expensive, please contact our office at [phone] option 4 or send us a message through MyChart.   We are unable to tell what your co-pay for medications will be in advance as this is different depending on your insurance coverage. However, we may be able to find a substitute medication at lower cost or fill out paperwork to get insurance to cover a needed medication.   If a prior authorization is required to get your medication covered by your insurance company, please allow us 1-2 business days to complete this process.  Drug prices often vary depending on where the prescription is filled and some pharmacies may offer cheaper prices.  The website www.goodrx.com contains coupons for medications through different pharmacies. The prices here do not account for what the cost may be with help from insurance (it may be cheaper with your insurance), but the website can give you the price if you did not use any insurance.  - You can print the associated coupon and take it with   your prescription to the pharmacy.  - You may also stop by our office during regular business hours and pick up a GoodRx coupon card.  - If you need your prescription sent electronically to a different pharmacy, notify our office through [facility] MyChart or by phone at [phone] option 4.     Si Usted Necesita Algo Despus de Su Visita  Tambin puede enviarnos un mensaje a travs de MyChart. Por lo general respondemos a los mensajes de MyChart en el transcurso de 1 a 2  das hbiles.  Para renovar recetas, por favor pida a su farmacia que se ponga en contacto con nuestra oficina. Nuestro nmero de fax es el [phone].  Si tiene un asunto urgente cuando la clnica est cerrada y que no puede esperar hasta el siguiente da hbil, puede llamar/localizar a su doctor(a) al nmero que aparece a continuacin.   Por favor, tenga en cuenta que aunque hacemos todo lo posible para estar disponibles para asuntos urgentes fuera del horario de oficina, no estamos disponibles las 24 horas del da, los 7 das de la semana.   Si tiene un problema urgente y no puede comunicarse con nosotros, puede optar por buscar atencin mdica  en el consultorio de su doctor(a), en una clnica privada, en un centro de atencin urgente o en una sala de emergencias.  Si tiene una emergencia mdica, por favor llame inmediatamente al 911 o vaya a la sala de emergencias.  Nmeros de bper  - Dr. Kowalski: [phone]  - Dra. Moye: [phone]  - Dra. Stewart: [phone]  En caso de inclemencias del tiempo, por favor llame a nuestra lnea principal al [phone] para una actualizacin sobre el estado de cualquier retraso o cierre.  Consejos para la medicacin en dermatologa: Por favor, guarde las cajas en las que vienen los medicamentos de uso tpico para ayudarle a seguir las instrucciones sobre dnde y cmo usarlos. Las farmacias generalmente imprimen las instrucciones del medicamento slo en las cajas y no directamente en los tubos del medicamento.   Si su medicamento es muy caro, por favor, pngase en contacto con nuestra oficina llamando al [phone] y presione la opcin 4 o envenos un mensaje a travs de MyChart.   No podemos decirle cul ser su copago por los medicamentos por adelantado ya que esto es diferente dependiendo de la cobertura de su seguro. Sin embargo, es posible que podamos encontrar un medicamento sustituto a menor costo o llenar un formulario para que el  seguro cubra el medicamento que se considera necesario.   Si se requiere una autorizacin previa para que su compaa de seguros cubra su medicamento, por favor permtanos de 1 a 2 das hbiles para completar este proceso.  Los precios de los medicamentos varan con frecuencia dependiendo del lugar de dnde se surte la receta y alguna farmacias pueden ofrecer precios ms baratos.  El sitio web www.goodrx.com tiene cupones para medicamentos de diferentes farmacias. Los precios aqu no tienen en cuenta lo que podra costar con la ayuda del seguro (puede ser ms barato con su seguro), pero el sitio web puede darle el precio si no utiliz ningn seguro.  - Puede imprimir el cupn correspondiente y llevarlo con su receta a la farmacia.  - Tambin puede pasar por nuestra oficina durante el horario de atencin regular y recoger una tarjeta de cupones de GoodRx.  - Si necesita que su receta se enve electrnicamente a una farmacia diferente, informe a nuestra oficina a travs de MyChart de [place]   o por telfono llamando al [phone] y presione la opcin 4.  

## 2022-04-11 ENCOUNTER — Encounter: Payer: Self-pay | Admitting: Dermatology

## 2022-05-25 ENCOUNTER — Ambulatory Visit (INDEPENDENT_AMBULATORY_CARE_PROVIDER_SITE_OTHER): Payer: PPO | Admitting: Internal Medicine

## 2022-05-25 ENCOUNTER — Encounter: Payer: Self-pay | Admitting: Internal Medicine

## 2022-05-25 VITALS — Ht 63.0 in | Wt 177.4 lb

## 2022-05-25 DIAGNOSIS — J309 Allergic rhinitis, unspecified: Secondary | ICD-10-CM

## 2022-05-25 DIAGNOSIS — L309 Dermatitis, unspecified: Secondary | ICD-10-CM

## 2022-05-25 DIAGNOSIS — E119 Type 2 diabetes mellitus without complications: Secondary | ICD-10-CM

## 2022-05-25 DIAGNOSIS — I10 Essential (primary) hypertension: Secondary | ICD-10-CM

## 2022-05-25 DIAGNOSIS — Z23 Encounter for immunization: Secondary | ICD-10-CM

## 2022-05-25 LAB — POCT GLYCOSYLATED HEMOGLOBIN (HGB A1C): HbA1c POC (<> result, manual entry): 5.8 % (ref 4.0–5.6)

## 2022-05-25 LAB — GLUCOSE, POCT (MANUAL RESULT ENTRY): POC Glucose: 143 mg/dl — AB (ref 70–99)

## 2022-05-25 NOTE — Progress Notes (Signed)
Established Patient Office Visit  Subjective:  Patient ID: Anne Parrish, female    DOB: [birth year]  Age: 71 y.o. MRN: [id number]  CC:  Chief Complaint  Patient presents with   Diabetes    Diabetes    Anne Parrish presents for check up  Past Medical History:  Diagnosis Date   Basal cell carcinoma 07/17/2020   R lat canthus/lower eyelid ED&C    Diabetes mellitus without complication ([place])    Hyperlipidemia    Hypertension     Past Surgical History:  Procedure Laterality Date   APPENDECTOMY  1960's   BREAST BIOPSY Right 02/28/2018    STROMAL FIBROSIS, 5 MM, AND LOBULAR INVOLUTION.   COLONOSCOPY WITH PROPOFOL N/A 04/15/2016   Procedure: COLONOSCOPY WITH PROPOFOL;  Surgeon: Manya Silvas, MD;  Location: [facility] ENDOSCOPY;  Service: Endoscopy;  Laterality: N/A;   COLONOSCOPY WITH PROPOFOL N/A 05/27/2020   Procedure: COLONOSCOPY WITH PROPOFOL;  Surgeon: Lucilla Lame, MD;  Location: [facility] ENDOSCOPY;  Service: Endoscopy;  Laterality: N/A;   REFRACTIVE SURGERY     TONSILLECTOMY AND ADENOIDECTOMY  1975    Family History  Problem Relation Age of Onset   Cancer Mother        lung   Thyroid disease Mother    Cancer Father        prostate   Heart disease Father    Cancer Brother        prostate   Breast cancer Neg Hx     Social History   Socioeconomic History   Marital status: Divorced    Spouse name: Not on file   Number of children: 2   Years of education: Not on file   Highest education level: Some college, no degree  Occupational History   Occupation: retired   Occupation: owns Engineer, building services  Tobacco Use   Smoking status: Never   Smokeless tobacco: Never  Vaping Use   Vaping Use: Never used  Substance and Sexual Activity   Alcohol use: Not Currently   Drug use: No   Sexual activity: Not Currently  Other Topics Concern   Not on file  Social History Narrative   Not on file   Social Determinants of Health   Financial Resource Strain: Low Risk   (06/05/2021)   Overall Financial Resource Strain (CARDIA)    Difficulty of Paying Living Expenses: Not hard at all  Food Insecurity: No [place] (06/05/2021)   Hunger Vital Sign    Worried About Running Out of Food in the Last Year: Never true    [place] in the Last Year: Never true  Transportation Needs: No Transportation Needs (06/05/2021)   PRAPARE - Hydrologist (Medical): No    Lack of Transportation (Non-Medical): No  Physical Activity: Sufficiently Active (06/05/2021)   Exercise Vital Sign    Days of Exercise per Week: 7 days    Minutes of Exercise per Session: 30 min  Stress: No Stress Concern Present (06/05/2021)   [place]    Feeling of Stress : Only a little  Social Connections: Moderately Integrated (06/05/2021)   Social Connection and Isolation Panel [NHANES]    Frequency of Communication with Friends and Family: Three times a week    Frequency of Social Gatherings with Friends and Family: Three times a week    Attends Religious Services: More than 4 times per year    Active Member of Clubs or  Organizations: No    Attends Archivist Meetings: Never    Marital Status: Living with partner  Intimate Partner Violence: Not At Risk (06/05/2021)   Humiliation, Afraid, Rape, and Kick questionnaire    Fear of Current or Ex-Partner: No    Emotionally Abused: No    Physically Abused: No    Sexually Abused: No     Current Outpatient Medications:    acetaminophen (TYLENOL) 650 MG CR tablet, Take 650 mg by mouth every 8 (eight) hours as needed for pain., Disp: , Rfl:    amLODipine (NORVASC) 5 MG tablet, Take 1 tablet (5 mg total) by mouth daily., Disp: 90 tablet, Rfl: 3   Bioflavonoid Products (BIOFLEX PO), Take by mouth daily. , Disp: , Rfl:    glucose blood (ONETOUCH VERIO) test strip, To check blood sugar once a day.  DX: E11.9, Disp: 100 each, Rfl: 3    lisinopril-hydrochlorothiazide (ZESTORETIC) 20-12.5 MG tablet, Take 1 tablet by mouth daily., Disp: 90 tablet, Rfl: 3   mometasone (ELOCON) 0.1 % cream, Apply to aa's QD-BID PRN up to 5d/wk., Disp: 50 g, Rfl: 3   Multiple Vitamin (MULTIVITAMIN) capsule, Take 1 capsule by mouth daily., Disp: , Rfl:    omeprazole (PRILOSEC) 20 MG capsule, TAKE 1 CAPSULE EVERY DAY, Disp: 90 capsule, Rfl: 3   ONETOUCH DELICA LANCETS FINE MISC, To check blood sugars once a day.  DX:  E11.9, Disp: 100 each, Rfl: 1   pioglitazone (ACTOS) 15 MG tablet, TAKE 1 TABLET BY MOUTH DAILY., Disp: 30 tablet, Rfl: 6   Allergies  Allergen Reactions   Adhesive [Tape]    Morphine And Related Itching   Other Other (See Comments)   Zolpidem    Benzonatate Rash   Levofloxacin Rash    ROS Review of Systems  Constitutional: Negative.   HENT: Negative.    Eyes: Negative.   Respiratory: Negative.    Cardiovascular: Negative.   Gastrointestinal: Negative.   Endocrine: Negative.   Genitourinary: Negative.   Musculoskeletal: Negative.   Skin: Negative.   Allergic/Immunologic: Negative.   Neurological: Negative.   Hematological: Negative.   Psychiatric/Behavioral: Negative.    All other systems reviewed and are negative.     Objective:    Physical Exam Vitals reviewed.  Constitutional:      Appearance: Normal appearance.  HENT:     Mouth/Throat:     Mouth: Mucous membranes are moist.  Eyes:     Pupils: Pupils are equal, round, and reactive to light.  Neck:     Vascular: No carotid bruit.  Cardiovascular:     Rate and Rhythm: Normal rate and regular rhythm.     Pulses: Normal pulses.     Heart sounds: Normal heart sounds.  Pulmonary:     Effort: Pulmonary effort is normal.     Breath sounds: Normal breath sounds.  Abdominal:     General: Bowel sounds are normal.     Palpations: Abdomen is soft. There is no hepatomegaly, splenomegaly or mass.     Tenderness: There is no abdominal tenderness.     Hernia: No  hernia is present.  Musculoskeletal:        General: No tenderness.     Cervical back: Neck supple.     Right lower leg: No edema.     Left lower leg: No edema.  Skin:    Findings: No rash.  Neurological:     Mental Status: She is alert and oriented to person, place, and time.  Motor: No weakness.  Psychiatric:        Mood and Affect: Mood and affect normal.        Behavior: Behavior normal.     Ht '5\' 3"'$  (1.6 m)   Wt 177 lb 6.4 oz (80.5 kg)   BMI 31.42 kg/m  Wt Readings from Last 3 Encounters:  05/25/22 177 lb 6.4 oz (80.5 kg)  02/22/22 183 lb 6.4 oz (83.2 kg)  12/29/21 187 lb 12.8 oz (85.2 kg)     Health Maintenance Due  Topic Date Due   Zoster Vaccines- Shingrix (1 of 2) Never done   FOOT EXAM  04/29/2020   COVID-19 Vaccine (4 - Pfizer risk series) 09/12/2020   DEXA SCAN  01/18/2021   INFLUENZA VACCINE  04/13/2022    There are no preventive care reminders to display for this patient.  Lab Results  Component Value Date   TSH 1.42 04/11/2020   Lab Results  Component Value Date   WBC 3.9 04/11/2020   HGB 12.2 04/11/2020   HCT 36.2 04/11/2020   MCV 89.4 04/11/2020   PLT 251 04/11/2020   Lab Results  Component Value Date   NA 138 04/11/2020   K 4.6 04/11/2020   CO2 27 04/11/2020   GLUCOSE 138 (H) 04/11/2020   BUN 16 04/11/2020   CREATININE 0.96 (H) 04/11/2020   BILITOT 0.9 04/11/2020   ALKPHOS 71 04/19/2018   AST 22 04/11/2020   ALT 13 04/11/2020   PROT 6.9 04/11/2020   ALBUMIN 4.4 04/19/2018   CALCIUM 9.8 04/11/2020   ANIONGAP 11 03/23/2016   Lab Results  Component Value Date   CHOL 140 04/19/2018   Lab Results  Component Value Date   HDL 51 04/19/2018   Lab Results  Component Value Date   LDLCALC 68 04/19/2018   Lab Results  Component Value Date   TRIG 106 04/19/2018   Lab Results  Component Value Date   CHOLHDL 2.7 04/19/2018   Lab Results  Component Value Date   HGBA1C 5.8 05/25/2022      Assessment & Plan:   Problem  List Items Addressed This Visit       Cardiovascular and Mediastinum   BP (high blood pressure)     Patient denies any chest pain or shortness of breath there is no history of palpitation or paroxysmal nocturnal dyspnea   patient was advised to follow low-salt low-cholesterol diet    ideally I want to keep systolic blood pressure below 130 mmHg, patient was asked to check blood pressure one times a week and give me a report on that.  Patient will be follow-up in 3 months  or earlier as needed, patient will call me back for any change in the cardiovascular symptoms Patient was advised to buy a book from local bookstore concerning blood pressure and read several chapters  every day.  This will be supplemented by some of the material we will give him from the office.  Patient should also utilize other resources like YouTube and Internet to learn more about the blood pressure and the diet.        Respiratory   Allergic rhinitis    Take Claritin 5 mg p.o. daily        Endocrine   Type 2 diabetes mellitus without complication, without long-term current use of insulin ([place])    - The patient's blood sugar is labile on med. - The patient will continue the current treatment regimen.  - I encouraged the patient  to regularly check blood sugar.  - I encouraged the patient to monitor diet. I encouraged the patient to eat low-carb and low-sugar to help prevent blood sugar spikes.  - I encouraged the patient to continue following their prescribed treatment plan for diabetes - I informed the patient to get help if blood sugar drops below '54mg'$ /dL, or if suddenly have trouble thinking clearly or breathing.  Patient was advised to buy a book on diabetes from a local bookstore or from [place]  Patient should read 2 chapters every day to keep the motivation going, this is in addition to some of the materials we provided them from the office.  There are other resources on the Internet like YouTube and wilkipedia  to get an education on the diabetes      Relevant Orders   POCT glucose (manual entry) (Completed)   POCT HgB A1C (Completed)     Musculoskeletal and Integument   Dermatitis    Stable        Other   Need for COVID-19 vaccine    Patient needs a COVID shot shingles shot and flu shot.      Other Visit Diagnoses     Encounter for diabetic foot exam ([place])    -  Primary   Relevant Orders   Ambulatory referral to Podiatry       No orders of the defined types were placed in this encounter.   Follow-up: No follow-ups on file.    Cletis Athens, MD

## 2022-05-25 NOTE — Assessment & Plan Note (Signed)

## 2022-05-25 NOTE — Assessment & Plan Note (Signed)
Take Claritin 5 mg p.o. daily 

## 2022-05-25 NOTE — Assessment & Plan Note (Signed)
Stable

## 2022-05-25 NOTE — Assessment & Plan Note (Signed)

## 2022-05-25 NOTE — Assessment & Plan Note (Signed)
Patient needs a COVID shot shingles shot and flu shot.

## 2022-06-22 ENCOUNTER — Encounter: Payer: PPO | Admitting: Internal Medicine

## 2022-09-20 ENCOUNTER — Encounter: Payer: Self-pay | Admitting: Internal Medicine

## 2022-09-20 ENCOUNTER — Ambulatory Visit (INDEPENDENT_AMBULATORY_CARE_PROVIDER_SITE_OTHER): Payer: PPO | Admitting: Internal Medicine

## 2022-09-20 VITALS — BP 200/100 | HR 55 | Ht 63.0 in | Wt 177.0 lb

## 2022-09-20 DIAGNOSIS — E119 Type 2 diabetes mellitus without complications: Secondary | ICD-10-CM | POA: Diagnosis not present

## 2022-09-20 DIAGNOSIS — J309 Allergic rhinitis, unspecified: Secondary | ICD-10-CM

## 2022-09-20 DIAGNOSIS — K219 Gastro-esophageal reflux disease without esophagitis: Secondary | ICD-10-CM | POA: Diagnosis not present

## 2022-09-20 DIAGNOSIS — I10 Essential (primary) hypertension: Secondary | ICD-10-CM

## 2022-09-20 DIAGNOSIS — R601 Generalized edema: Secondary | ICD-10-CM

## 2022-09-20 DIAGNOSIS — Z0000 Encounter for general adult medical examination without abnormal findings: Secondary | ICD-10-CM

## 2022-09-20 MED ORDER — LISINOPRIL-HYDROCHLOROTHIAZIDE 20-12.5 MG PO TABS: 1.0000 | ORAL_TABLET | Freq: Every day | ORAL | 3 refills | Status: DC

## 2022-09-20 MED ORDER — AMLODIPINE BESYLATE 5 MG PO TABS: 5.0000 mg | ORAL_TABLET | Freq: Every day | ORAL | 3 refills | Status: DC

## 2022-09-20 MED ORDER — PIOGLITAZONE HCL 15 MG PO TABS: 15.0000 mg | ORAL_TABLET | Freq: Every day | ORAL | 1 refills | Status: DC

## 2022-09-20 NOTE — Assessment & Plan Note (Signed)
Heart is regular there is no bruit in the neck.  Chest decreased breath sound.  Abdomen is soft nontender there is no hepatosplenomegaly.  There is 2+ pedal edema in both legs we will order a CBC metabolic panel thyroid test and B12 level.  She is to come back in 2 weeks for checkup.

## 2022-09-20 NOTE — Assessment & Plan Note (Signed)
Will check a complete metabolic panel

## 2022-09-20 NOTE — Progress Notes (Deleted)
Subjective:   Anne Parrish is a 72 y.o. female who presents for Medicare Annual (Subsequent) preventive examination.  Review of Systems    ***       Objective:    There were no vitals filed for this visit. There is no height or weight on file to calculate BMI.     06/05/2021   11:51 AM 05/27/2020    9:53 AM 05/10/2019    8:44 AM 04/19/2018    9:37 AM 04/15/2016    7:44 AM 12/30/2015   11:32 AM 12/30/2015   10:59 AM  Advanced Directives  Does Patient Have a Medical Advance Directive? No No No No No No Yes  Type of Transport planner;Living will  Does patient want to make changes to medical advance directive?    Yes (MAU/Ambulatory/Procedural Areas - Information given)     Would patient like information on creating a medical advance directive?   No - Patient declined  No - patient declined information      Current Medications (verified) Outpatient Encounter Medications as of 09/20/2022  Medication Sig   acetaminophen (TYLENOL) 650 MG CR tablet Take 650 mg by mouth every 8 (eight) hours as needed for pain.   amLODipine (NORVASC) 5 MG tablet Take 1 tablet (5 mg total) by mouth daily.   Bioflavonoid Products (BIOFLEX PO) Take by mouth daily.    glucose blood (ONETOUCH VERIO) test strip To check blood sugar once a day.  DX: E11.9   lisinopril-hydrochlorothiazide (ZESTORETIC) 20-12.5 MG tablet Take 1 tablet by mouth daily.   mometasone (ELOCON) 0.1 % cream Apply to aa's QD-BID PRN up to 5d/wk.   Multiple Vitamin (MULTIVITAMIN) capsule Take 1 capsule by mouth daily.   omeprazole (PRILOSEC) 20 MG capsule TAKE 1 CAPSULE EVERY DAY   ONETOUCH DELICA LANCETS FINE MISC To check blood sugars once a day.  DX:  E11.9   pioglitazone (ACTOS) 15 MG tablet TAKE 1 TABLET BY MOUTH DAILY.   No facility-administered encounter medications on file as of 09/20/2022.    Allergies (verified) Adhesive [tape], Morphine and related, Other, Zolpidem, Benzonatate, and  Levofloxacin   History: Past Medical History:  Diagnosis Date   Basal cell carcinoma 07/17/2020   R lat canthus/lower eyelid ED&C    Diabetes mellitus without complication ([place])    Hyperlipidemia    Hypertension    Past Surgical History:  Procedure Laterality Date   APPENDECTOMY  1960's   BREAST BIOPSY Right 02/28/2018    STROMAL FIBROSIS, 5 MM, AND LOBULAR INVOLUTION.   COLONOSCOPY WITH PROPOFOL N/A 04/15/2016   Procedure: COLONOSCOPY WITH PROPOFOL;  Surgeon: Manya Silvas, MD;  Location: [facility] ENDOSCOPY;  Service: Endoscopy;  Laterality: N/A;   COLONOSCOPY WITH PROPOFOL N/A 05/27/2020   Procedure: COLONOSCOPY WITH PROPOFOL;  Surgeon: Lucilla Lame, MD;  Location: [facility] ENDOSCOPY;  Service: Endoscopy;  Laterality: N/A;   REFRACTIVE SURGERY     TONSILLECTOMY AND ADENOIDECTOMY  1975   Family History  Problem Relation Age of Onset   Cancer Mother        lung   Thyroid disease Mother    Cancer Father        prostate   Heart disease Father    Cancer Brother        prostate   Breast cancer Neg Hx    Social History   Socioeconomic History   Marital status: Divorced    Spouse name: Not on file  Number of children: 2   Years of education: Not on file   Highest education level: Some college, no degree  Occupational History   Occupation: retired   Occupation: owns Engineer, building services  Tobacco Use   Smoking status: Never   Smokeless tobacco: Never  Vaping Use   Vaping Use: Never used  Substance and Sexual Activity   Alcohol use: Not Currently   Drug use: No   Sexual activity: Not Currently  Other Topics Concern   Not on file  Social History Narrative   Not on file   Social Determinants of Health   Financial Resource Strain: Low Risk  (06/05/2021)   Overall Financial Resource Strain (CARDIA)    Difficulty of Paying Living Expenses: Not hard at all  Food Insecurity: No [place] (06/05/2021)   Hunger Vital Sign    Worried About Running Out of Food in the Last  Year: Never true    [place] in the Last Year: Never true  Transportation Needs: No Transportation Needs (06/05/2021)   PRAPARE - Hydrologist (Medical): No    Lack of Transportation (Non-Medical): No  Physical Activity: Sufficiently Active (06/05/2021)   Exercise Vital Sign    Days of Exercise per Week: 7 days    Minutes of Exercise per Session: 30 min  Stress: No Stress Concern Present (06/05/2021)   [place]    Feeling of Stress : Only a little  Social Connections: Moderately Integrated (06/05/2021)   Social Connection and Isolation Panel [NHANES]    Frequency of Communication with Friends and Family: Three times a week    Frequency of Social Gatherings with Friends and Family: Three times a week    Attends Religious Services: More than 4 times per year    Active Member of Clubs or Organizations: No    Attends Archivist Meetings: Never    Marital Status: Living with partner    Tobacco Counseling Counseling given: Not Answered   Clinical Intake:                 Diabetic?***         Activities of Daily Living     No data to display          Patient Care Team: Cletis Athens, MD as PCP - General (Internal Medicine) Thelma Comp, [place] as Consulting Physician (Optometry) Ralene Bathe, MD (Dermatology) Gabriel Carina Betsey Holiday, MD as Physician Assistant (Endocrinology)  Indicate any recent Medical Services you may have received from other than [facility] providers in the past year (date may be approximate).     Assessment:   This is a routine wellness examination for [place].  Hearing/Vision screen No results found.  Dietary issues and exercise activities discussed:     Goals Addressed   None    Depression Screen    05/25/2022    9:47 AM 06/05/2021   11:36 AM 04/18/2020   11:04 AM 05/10/2019    8:45 AM 04/19/2018    9:40 AM 04/19/2018    9:37 AM  12/30/2016    9:00 AM  PHQ 2/9 Scores  PHQ - 2 Score 0 0 0 0 0 0 0  PHQ- 9 Score     1  0    Fall Risk    05/25/2022    9:46 AM 06/05/2021   11:51 AM 04/18/2020   11:03 AM 05/11/2019   10:22 AM 05/10/2019  8:45 AM  Fall Risk   Falls in the past year? 0 0 0 0 0  Number falls in past yr: 0 0 0 0   Injury with Fall? 0 0 0 0   Risk for fall due to : No Fall Risks No Fall Risks     Follow up Falls evaluation completed Falls evaluation completed       [place]:  Any stairs in or around the home? {YES/NO:21197} If so, are there any without handrails? {YES/NO:21197} Home free of loose throw rugs in walkways, pet beds, electrical cords, etc? {YES/NO:21197} Adequate lighting in your home to reduce risk of falls? {YES/NO:21197}  ASSISTIVE DEVICES UTILIZED TO PREVENT FALLS:  Life alert? {YES/NO:21197} Use of a cane, walker or w/c? {YES/NO:21197} Grab bars in the bathroom? {YES/NO:21197} Shower chair or bench in shower? {YES/NO:21197} Elevated toilet seat or a handicapped toilet? {YES/NO:21197}  TIMED UP AND GO:  Was the test performed? {YES/NO:21197}.  Length of time to ambulate 10 feet: *** sec.   {Appearance of KKXF:8182993}  Cognitive Function:        06/05/2021   11:55 AM 05/11/2019    9:17 AM  6CIT Screen  What Year? 0 points 0 points  What month? 0 points 0 points  What time? 0 points 0 points  Count back from 20 0 points 0 points  Months in reverse 0 points 0 points  Repeat phrase 0 points 0 points  Total Score 0 points 0 points    Immunizations Immunization History  Administered Date(s) Administered   Fluad Quad(high Dose 65+) 05/11/2019, 07/08/2021   Influenza, High Dose Seasonal PF 07/02/2018   PFIZER(Purple Top)SARS-COV-2 Vaccination 11/11/2019, 12/04/2019, 07/18/2020   Pneumococcal Conjugate-13 08/13/2015   Pneumococcal Polysaccharide-23 12/30/2016   Tdap 07/08/2021    {TDAP status:2101805}  {Flu Vaccine  status:2101806}  {Pneumococcal vaccine status:2101807}  {Covid-19 vaccine status:2101808}  Qualifies for Shingles Vaccine? {YES/NO:21197}  Zostavax completed {YES/NO:21197}  {Shingrix Completed?:2101804}  Screening Tests Health Maintenance  Topic Date Due   Diabetic kidney evaluation - Urine ACR  Never done   Zoster Vaccines- Shingrix (1 of 2) Never done   FOOT EXAM  04/29/2020   DEXA SCAN  01/18/2021   Diabetic kidney evaluation - eGFR measurement  04/11/2021   INFLUENZA VACCINE  04/13/2022   COVID-19 Vaccine (4 - 2023-24 season) 05/14/2022   Medicare Annual Wellness (AWV)  06/05/2022   OPHTHALMOLOGY EXAM  06/22/2022   HEMOGLOBIN A1C  11/23/2022   MAMMOGRAM  07/10/2023   COLONOSCOPY (Pts 45-71yr Insurance coverage will need to be confirmed)  05/27/2025   DTaP/Tdap/Td (2 - Td or Tdap) 07/09/2031   Pneumonia Vaccine 72 Years old  Completed   Hepatitis C Screening  Completed   HPV VACCINES  Aged Out    Health Maintenance  Health Maintenance Due  Topic Date Due   Diabetic kidney evaluation - Urine ACR  Never done   Zoster Vaccines- Shingrix (1 of 2) Never done   FOOT EXAM  04/29/2020   DEXA SCAN  01/18/2021   Diabetic kidney evaluation - eGFR measurement  04/11/2021   INFLUENZA VACCINE  04/13/2022   COVID-19 Vaccine (4 - 2023-24 season) 05/14/2022   Medicare Annual Wellness (AWV)  06/05/2022   OPHTHALMOLOGY EXAM  06/22/2022    {Colorectal cancer screening:2101809}  {Mammogram status:21018020}  {Bone Density status:21018021}  Lung Cancer Screening: (Low Dose CT Chest recommended if Age 5-80 years, 30 pack-year currently smoking OR have quit w/in 15years.) {DOES NOT does:27190::"does not"}  qualify.   Lung Cancer Screening Referral: ***  Additional Screening:  Hepatitis C Screening: {DOES NOT does:27190::"does not"} qualify; Completed ***  Vision Screening: Recommended annual ophthalmology exams for early detection of glaucoma and other disorders of the  eye. Is the patient up to date with their annual eye exam?  {YES/NO:21197} Who is the provider or what is the name of the office in which the patient attends annual eye exams? *** If pt is not established with a provider, would they like to be referred to a provider to establish care? {YES/NO:21197}.   Dental Screening: Recommended annual dental exams for proper oral hygiene  Community Resource Referral / Chronic Care Management: CRR required this visit?  {YES/NO:21197}  CCM required this visit?  {YES/NO:21197}     Plan:     I have personally reviewed and noted the following in the patient's chart:   Medical and social history Use of alcohol, tobacco or illicit drugs  Current medications and supplements including opioid prescriptions. {Opioid [phone]} Functional ability and status Nutritional status Physical activity Advanced directives List of other physicians Hospitalizations, surgeries, and ER visits in previous 12 months Vitals Screenings to include cognitive, depression, and falls Referrals and appointments  In addition, I have reviewed and discussed with patient certain preventive protocols, quality metrics, and best practice recommendations. A written personalized care plan for preventive services as well as general preventive health recommendations were provided to patient.     Amado Coe, [place]   09/20/2022   Nurse Notes: ***

## 2022-09-20 NOTE — Assessment & Plan Note (Signed)
Her blood pressure medicines were refilled.  Will do a metabolic panel and CBC to check for anemia

## 2022-09-20 NOTE — Assessment & Plan Note (Signed)
Blood pressure is elevated, patient is not taking on medicine.  Her medicines were sent to the pharmacy.  She should be seen in 2 weeks.

## 2022-09-20 NOTE — Progress Notes (Signed)
Established Patient Office Visit  Subjective:  Patient ID: Anne Parrish, female    DOB: [birth year]  Age: 72 y.o. MRN: [id number]  CC:  Chief Complaint  Patient presents with   Medicare Wellness    Sequential    HPI  Anne Parrish presents for general check  Past Medical History:  Diagnosis Date   Basal cell carcinoma 07/17/2020   R lat canthus/lower eyelid ED&C    Diabetes mellitus without complication ([place])    Hyperlipidemia    Hypertension     Past Surgical History:  Procedure Laterality Date   APPENDECTOMY  1960's   BREAST BIOPSY Right 02/28/2018    STROMAL FIBROSIS, 5 MM, AND LOBULAR INVOLUTION.   COLONOSCOPY WITH PROPOFOL N/A 04/15/2016   Procedure: COLONOSCOPY WITH PROPOFOL;  Surgeon: Manya Silvas, MD;  Location: [facility] ENDOSCOPY;  Service: Endoscopy;  Laterality: N/A;   COLONOSCOPY WITH PROPOFOL N/A 05/27/2020   Procedure: COLONOSCOPY WITH PROPOFOL;  Surgeon: Lucilla Lame, MD;  Location: [facility] ENDOSCOPY;  Service: Endoscopy;  Laterality: N/A;   REFRACTIVE SURGERY     TONSILLECTOMY AND ADENOIDECTOMY  1975    Family History  Problem Relation Age of Onset   Cancer Mother        lung   Thyroid disease Mother    Cancer Father        prostate   Heart disease Father    Cancer Brother        prostate   Breast cancer Neg Hx     Social History   Socioeconomic History   Marital status: Divorced    Spouse name: Not on file   Number of children: 2   Years of education: Not on file   Highest education level: Some college, no degree  Occupational History   Occupation: retired   Occupation: owns Engineer, building services  Tobacco Use   Smoking status: Never   Smokeless tobacco: Never  Vaping Use   Vaping Use: Never used  Substance and Sexual Activity   Alcohol use: Not Currently   Drug use: No   Sexual activity: Not Currently  Other Topics Concern   Not on file  Social History Narrative   Not on file   Social Determinants of Health   Financial Resource  Strain: Low Risk  (09/20/2022)   Overall Financial Resource Strain (CARDIA)    Difficulty of Paying Living Expenses: Not hard at all  Food Insecurity: No Food Insecurity (09/20/2022)   Hunger Vital Sign    Worried About Running Out of Food in the Last Year: Never true    [place] in the Last Year: Never true  Transportation Needs: No Transportation Needs (09/20/2022)   PRAPARE - Hydrologist (Medical): No    Lack of Transportation (Non-Medical): No  Physical Activity: Inactive (09/20/2022)   Exercise Vital Sign    Days of Exercise per Week: 0 days    Minutes of Exercise per Session: 0 min  Stress: No Stress Concern Present (09/20/2022)   [place]    Feeling of Stress : Not at all  Social Connections: Moderately Integrated (09/20/2022)   Social Connection and Isolation Panel [NHANES]    Frequency of Communication with Friends and Family: More than three times a week    Frequency of Social Gatherings with Friends and Family: Three times a week    Attends Religious Services: 1 to 4 times per year    Active  Member of Clubs or Organizations: No    Attends Archivist Meetings: Never    Marital Status: Living with partner  Intimate Partner Violence: Not At Risk (09/20/2022)   Humiliation, Afraid, Rape, and Kick questionnaire    Fear of Current or Ex-Partner: No    Emotionally Abused: No    Physically Abused: No    Sexually Abused: No     Current Outpatient Medications:    acetaminophen (TYLENOL) 650 MG CR tablet, Take 650 mg by mouth every 8 (eight) hours as needed for pain., Disp: , Rfl:    Bioflavonoid Products (BIOFLEX PO), Take by mouth daily. , Disp: , Rfl:    glucose blood (ONETOUCH VERIO) test strip, To check blood sugar once a day.  DX: E11.9, Disp: 100 each, Rfl: 3   mometasone (ELOCON) 0.1 % cream, Apply to aa's QD-BID PRN up to 5d/wk., Disp: 50 g, Rfl: 3   Multiple Vitamin  (MULTIVITAMIN) capsule, Take 1 capsule by mouth daily., Disp: , Rfl:    omeprazole (PRILOSEC) 20 MG capsule, TAKE 1 CAPSULE EVERY DAY, Disp: 90 capsule, Rfl: 3   ONETOUCH DELICA LANCETS FINE MISC, To check blood sugars once a day.  DX:  E11.9, Disp: 100 each, Rfl: 1   amLODipine (NORVASC) 5 MG tablet, Take 1 tablet (5 mg total) by mouth daily., Disp: 90 tablet, Rfl: 3   lisinopril-hydrochlorothiazide (ZESTORETIC) 20-12.5 MG tablet, Take 1 tablet by mouth daily., Disp: 90 tablet, Rfl: 3   pioglitazone (ACTOS) 15 MG tablet, Take 1 tablet (15 mg total) by mouth daily., Disp: 90 tablet, Rfl: 1   Allergies  Allergen Reactions   Adhesive [Tape]    Morphine And Related Itching   Other Other (See Comments)   Zolpidem    Benzonatate Rash   Levofloxacin Rash    ROS Review of Systems  Constitutional: Negative.  Negative for diaphoresis.  HENT: Negative.  Negative for congestion.   Eyes: Negative.   Respiratory: Negative.    Cardiovascular:  Positive for leg swelling. Negative for chest pain.  Gastrointestinal: Negative.  Negative for diarrhea.  Endocrine: Negative.   Genitourinary:  Positive for enuresis. Negative for difficulty urinating.  Musculoskeletal: Negative.  Negative for back pain and joint swelling.  Skin: Negative.   Allergic/Immunologic: Negative.   Neurological:  Positive for weakness. Negative for tremors, speech difficulty and light-headedness.  Hematological: Negative.   Psychiatric/Behavioral:  Positive for behavioral problems and confusion.   All other systems reviewed and are negative.     Objective:    Physical Exam Cardiovascular:     Rate and Rhythm: Regular rhythm.     Heart sounds: Normal heart sounds.  Pulmonary:     Breath sounds: Normal breath sounds.  Musculoskeletal:        General: Swelling present. No tenderness.     Cervical back: Neck supple.     Right lower leg: Edema present.     Left lower leg: Edema present.  Neurological:     General: No  focal deficit present.     Mental Status: She is disoriented.     BP (!) 200/100 Comment: takes medication at night  Pulse (!) 55   Ht '5\' 3"'$  (1.6 m)   Wt 177 lb (80.3 kg)   SpO2 99%   BMI 31.35 kg/m  Wt Readings from Last 3 Encounters:  09/20/22 177 lb (80.3 kg)  05/25/22 177 lb 6.4 oz (80.5 kg)  02/22/22 183 lb 6.4 oz (83.2 kg)     Health  Maintenance Due  Topic Date Due   Diabetic kidney evaluation - Urine ACR  Never done   Zoster Vaccines- Shingrix (1 of 2) Never done   FOOT EXAM  04/29/2020   DEXA SCAN  01/18/2021   Diabetic kidney evaluation - eGFR measurement  04/11/2021   INFLUENZA VACCINE  04/13/2022   COVID-19 Vaccine (4 - 2023-24 season) 05/14/2022   Medicare Annual Wellness (AWV)  06/05/2022   OPHTHALMOLOGY EXAM  06/22/2022    There are no preventive care reminders to display for this patient.  Lab Results  Component Value Date   TSH 1.42 04/11/2020   Lab Results  Component Value Date   WBC 3.9 04/11/2020   HGB 12.2 04/11/2020   HCT 36.2 04/11/2020   MCV 89.4 04/11/2020   PLT 251 04/11/2020   Lab Results  Component Value Date   NA 138 04/11/2020   K 4.6 04/11/2020   CO2 27 04/11/2020   GLUCOSE 138 (H) 04/11/2020   BUN 16 04/11/2020   CREATININE 0.96 (H) 04/11/2020   BILITOT 0.9 04/11/2020   ALKPHOS 71 04/19/2018   AST 22 04/11/2020   ALT 13 04/11/2020   PROT 6.9 04/11/2020   ALBUMIN 4.4 04/19/2018   CALCIUM 9.8 04/11/2020   ANIONGAP 11 03/23/2016   Lab Results  Component Value Date   CHOL 140 04/19/2018   Lab Results  Component Value Date   HDL 51 04/19/2018   Lab Results  Component Value Date   LDLCALC 68 04/19/2018   Lab Results  Component Value Date   TRIG 106 04/19/2018   Lab Results  Component Value Date   CHOLHDL 2.7 04/19/2018   Lab Results  Component Value Date   HGBA1C 5.8 05/25/2022      Assessment & Plan:   Problem List Items Addressed This Visit       Cardiovascular and Mediastinum   Essential  hypertension - Primary    Blood pressure is elevated, patient is not taking on medicine.  Her medicines were sent to the pharmacy.  She should be seen in 2 weeks.      Relevant Medications   amLODipine (NORVASC) 5 MG tablet   lisinopril-hydrochlorothiazide (ZESTORETIC) 20-12.5 MG tablet   Other Relevant Orders   CBC with Differential/Platelet   Basic metabolic panel   TSH   U04     Respiratory   Allergic rhinitis   Relevant Orders   CBC with Differential/Platelet   Basic metabolic panel   TSH   V40     Digestive   GERD (gastroesophageal reflux disease)    Reflux is stable.      Relevant Orders   CBC with Differential/Platelet   Basic metabolic panel   TSH   J81     Endocrine   Type 2 diabetes mellitus without complication, without long-term current use of insulin (HCC)    Will check a complete metabolic panel      Relevant Medications   lisinopril-hydrochlorothiazide (ZESTORETIC) 20-12.5 MG tablet   pioglitazone (ACTOS) 15 MG tablet   Other Relevant Orders   CBC with Differential/Platelet   Basic metabolic panel   TSH   X91     Other   Annual physical exam    Heart is regular there is no bruit in the neck.  Chest decreased breath sound.  Abdomen is soft nontender there is no hepatosplenomegaly.  There is 2+ pedal edema in both legs we will order a CBC metabolic panel thyroid test and B12 level.  She is to  come back in 2 weeks for checkup.      Relevant Orders   CBC with Differential/Platelet   Basic metabolic panel   TSH   L46   Generalized edema    Her blood pressure medicines were refilled.  Will do a metabolic panel and CBC to check for anemia      Relevant Orders   B12    Meds ordered this encounter  Medications   amLODipine (NORVASC) 5 MG tablet    Sig: Take 1 tablet (5 mg total) by mouth daily.    Dispense:  90 tablet    Refill:  3    PT NEEDS REFILL.   lisinopril-hydrochlorothiazide (ZESTORETIC) 20-12.5 MG tablet    Sig: Take 1 tablet by  mouth daily.    Dispense:  90 tablet    Refill:  3   pioglitazone (ACTOS) 15 MG tablet    Sig: Take 1 tablet (15 mg total) by mouth daily.    Dispense:  90 tablet    Refill:  1    PT REQUEST REFILL    Follow-up: No follow-ups on file.    Cletis Athens, MD

## 2022-09-20 NOTE — Assessment & Plan Note (Signed)
Reflux is stable.

## 2022-09-21 LAB — BASIC METABOLIC PANEL
BUN: 15 mg/dL (ref 7–25)
CO2: 27 mmol/L (ref 20–32)
Calcium: 9.6 mg/dL (ref 8.6–10.4)
Chloride: 107 mmol/L (ref 98–110)
Creat: 0.88 mg/dL (ref 0.60–1.00)
Glucose, Bld: 124 mg/dL — ABNORMAL HIGH (ref 65–99)
Potassium: 4 mmol/L (ref 3.5–5.3)
Sodium: 141 mmol/L (ref 135–146)

## 2022-09-21 LAB — CBC WITH DIFFERENTIAL/PLATELET
Absolute Monocytes: 519 cells/uL (ref 200–950)
Basophils Absolute: 40 cells/uL (ref 0–200)
Basophils Relative: 0.7 %
Eosinophils Absolute: 217 cells/uL (ref 15–500)
Eosinophils Relative: 3.8 %
HCT: 35 % (ref 35.0–45.0)
Hemoglobin: 11.6 g/dL — ABNORMAL LOW (ref 11.7–15.5)
Lymphs Abs: 1231 cells/uL (ref 850–3900)
MCH: 27.8 pg (ref 27.0–33.0)
MCHC: 33.1 g/dL (ref 32.0–36.0)
MCV: 83.7 fL (ref 80.0–100.0)
MPV: 9.9 fL (ref 7.5–12.5)
Monocytes Relative: 9.1 %
Neutro Abs: 3694 cells/uL (ref 1500–7800)
Neutrophils Relative %: 64.8 %
Platelets: 264 10*3/uL (ref 140–400)
RBC: 4.18 10*6/uL (ref 3.80–5.10)
RDW: 12.3 % (ref 11.0–15.0)
Total Lymphocyte: 21.6 %
WBC: 5.7 10*3/uL (ref 3.8–10.8)

## 2022-09-21 LAB — VITAMIN B12: Vitamin B-12: 340 pg/mL (ref 200–1100)

## 2022-09-21 LAB — TSH: TSH: 2.06 mIU/L (ref 0.40–4.50)

## 2022-10-07 ENCOUNTER — Ambulatory Visit: Payer: PPO | Admitting: Nurse Practitioner

## 2022-10-13 ENCOUNTER — Ambulatory Visit: Payer: PPO | Admitting: Dermatology

## 2023-02-21 ENCOUNTER — Other Ambulatory Visit: Payer: Self-pay | Admitting: Internal Medicine

## 2023-02-21 DIAGNOSIS — R600 Localized edema: Secondary | ICD-10-CM

## 2023-02-25 ENCOUNTER — Ambulatory Visit
Admission: RE | Admit: 2023-02-25 | Discharge: 2023-02-25 | Disposition: A | Discharge status: Home / Self Care | Payer: PPO | Source: Ambulatory Visit | Attending: Internal Medicine | Admitting: Internal Medicine

## 2023-02-25 ENCOUNTER — Other Ambulatory Visit
Admission: RE | Admit: 2023-02-25 | Discharge: 2023-02-25 | Disposition: A | Discharge status: Home / Self Care | Payer: PPO | Source: Ambulatory Visit | Attending: Internal Medicine | Admitting: Internal Medicine

## 2023-02-25 DIAGNOSIS — I10 Essential (primary) hypertension: Secondary | ICD-10-CM | POA: Diagnosis not present

## 2023-02-25 DIAGNOSIS — R600 Localized edema: Secondary | ICD-10-CM | POA: Diagnosis present

## 2023-02-25 DIAGNOSIS — E119 Type 2 diabetes mellitus without complications: Secondary | ICD-10-CM | POA: Diagnosis not present

## 2023-02-25 LAB — CBC
HCT: 33.5 % — ABNORMAL LOW (ref 36.0–46.0)
Hemoglobin: 11.3 g/dL — ABNORMAL LOW (ref 12.0–15.0)
MCH: 29 pg (ref 26.0–34.0)
MCHC: 33.7 g/dL (ref 30.0–36.0)
MCV: 86.1 fL (ref 80.0–100.0)
Platelets: 250 10*3/uL (ref 150–400)
RBC: 3.89 MIL/uL (ref 3.87–5.11)
RDW: 12.1 % (ref 11.5–15.5)
WBC: 4.9 10*3/uL (ref 4.0–10.5)
nRBC: 0 % (ref 0.0–0.2)

## 2023-02-25 LAB — BASIC METABOLIC PANEL
Anion gap: 11 (ref 5–15)
BUN: 12 mg/dL (ref 8–23)
CO2: 26 mmol/L (ref 22–32)
Calcium: 8.9 mg/dL (ref 8.9–10.3)
Chloride: 95 mmol/L — ABNORMAL LOW (ref 98–111)
Creatinine, Ser: 1.05 mg/dL — ABNORMAL HIGH (ref 0.44–1.00)
GFR, Estimated: 56 mL/min — ABNORMAL LOW (ref >60)
Glucose, Bld: 114 mg/dL — ABNORMAL HIGH (ref 70–99)
Potassium: 3 mmol/L — ABNORMAL LOW (ref 3.5–5.1)
Sodium: 132 mmol/L — ABNORMAL LOW (ref 135–145)

## 2023-02-25 LAB — LIPID PANEL
Cholesterol: 214 mg/dL — ABNORMAL HIGH (ref 0–200)
HDL: 53 mg/dL (ref >40)
LDL Cholesterol: 138 mg/dL — ABNORMAL HIGH (ref 0–99)
Total CHOL/HDL Ratio: 4 RATIO
Triglycerides: 116 mg/dL (ref <150)
VLDL: 23 mg/dL (ref 0–40)

## 2023-02-25 LAB — HEMOGLOBIN A1C
Hgb A1c MFr Bld: 7 % — ABNORMAL HIGH (ref 4.8–5.6)
Mean Plasma Glucose: 154.2 mg/dL

## 2023-03-21 ENCOUNTER — Encounter: Payer: Self-pay | Admitting: *Deleted

## 2023-03-21 ENCOUNTER — Emergency Department: Payer: PPO

## 2023-03-21 ENCOUNTER — Other Ambulatory Visit: Payer: Self-pay

## 2023-03-21 DIAGNOSIS — Z6831 Body mass index (BMI) 31.0-31.9, adult: Secondary | ICD-10-CM

## 2023-03-21 DIAGNOSIS — Z7984 Long term (current) use of oral hypoglycemic drugs: Secondary | ICD-10-CM

## 2023-03-21 DIAGNOSIS — R4189 Other symptoms and signs involving cognitive functions and awareness: Secondary | ICD-10-CM | POA: Diagnosis present

## 2023-03-21 DIAGNOSIS — U071 COVID-19: Secondary | ICD-10-CM | POA: Diagnosis not present

## 2023-03-21 DIAGNOSIS — M4801 Spinal stenosis, occipito-atlanto-axial region: Secondary | ICD-10-CM | POA: Diagnosis present

## 2023-03-21 DIAGNOSIS — K8020 Calculus of gallbladder without cholecystitis without obstruction: Secondary | ICD-10-CM | POA: Diagnosis present

## 2023-03-21 DIAGNOSIS — Z91148 Patient's other noncompliance with medication regimen for other reason: Secondary | ICD-10-CM

## 2023-03-21 DIAGNOSIS — Z801 Family history of malignant neoplasm of trachea, bronchus and lung: Secondary | ICD-10-CM

## 2023-03-21 DIAGNOSIS — R9082 White matter disease, unspecified: Secondary | ICD-10-CM | POA: Diagnosis present

## 2023-03-21 DIAGNOSIS — K219 Gastro-esophageal reflux disease without esophagitis: Secondary | ICD-10-CM | POA: Diagnosis present

## 2023-03-21 DIAGNOSIS — M4802 Spinal stenosis, cervical region: Secondary | ICD-10-CM | POA: Diagnosis present

## 2023-03-21 DIAGNOSIS — Z8349 Family history of other endocrine, nutritional and metabolic diseases: Secondary | ICD-10-CM

## 2023-03-21 DIAGNOSIS — E119 Type 2 diabetes mellitus without complications: Secondary | ICD-10-CM | POA: Diagnosis present

## 2023-03-21 DIAGNOSIS — F0390 Unspecified dementia without behavioral disturbance: Secondary | ICD-10-CM | POA: Diagnosis present

## 2023-03-21 DIAGNOSIS — G9341 Metabolic encephalopathy: Secondary | ICD-10-CM | POA: Diagnosis present

## 2023-03-21 DIAGNOSIS — Z8249 Family history of ischemic heart disease and other diseases of the circulatory system: Secondary | ICD-10-CM

## 2023-03-21 DIAGNOSIS — E785 Hyperlipidemia, unspecified: Secondary | ICD-10-CM | POA: Diagnosis present

## 2023-03-21 DIAGNOSIS — I160 Hypertensive urgency: Secondary | ICD-10-CM | POA: Diagnosis present

## 2023-03-21 DIAGNOSIS — Z885 Allergy status to narcotic agent status: Secondary | ICD-10-CM

## 2023-03-21 DIAGNOSIS — Z888 Allergy status to other drugs, medicaments and biological substances status: Secondary | ICD-10-CM

## 2023-03-21 DIAGNOSIS — Z85828 Personal history of other malignant neoplasm of skin: Secondary | ICD-10-CM

## 2023-03-21 DIAGNOSIS — Z79899 Other long term (current) drug therapy: Secondary | ICD-10-CM

## 2023-03-21 DIAGNOSIS — I10 Essential (primary) hypertension: Secondary | ICD-10-CM | POA: Diagnosis present

## 2023-03-21 DIAGNOSIS — E669 Obesity, unspecified: Secondary | ICD-10-CM | POA: Diagnosis present

## 2023-03-21 DIAGNOSIS — E876 Hypokalemia: Secondary | ICD-10-CM | POA: Diagnosis present

## 2023-03-21 DIAGNOSIS — G9340 Encephalopathy, unspecified: Secondary | ICD-10-CM | POA: Diagnosis not present

## 2023-03-21 DIAGNOSIS — D509 Iron deficiency anemia, unspecified: Secondary | ICD-10-CM | POA: Diagnosis present

## 2023-03-21 LAB — CBC WITH DIFFERENTIAL/PLATELET
Abs Immature Granulocytes: 0.03 10*3/uL (ref 0.00–0.07)
Basophils Absolute: 0 10*3/uL (ref 0.0–0.1)
Basophils Relative: 1 %
Eosinophils Absolute: 0.2 10*3/uL (ref 0.0–0.5)
Eosinophils Relative: 3 %
HCT: 35.5 % — ABNORMAL LOW (ref 36.0–46.0)
Hemoglobin: 11.3 g/dL — ABNORMAL LOW (ref 12.0–15.0)
Immature Granulocytes: 1 %
Lymphocytes Relative: 17 %
Lymphs Abs: 0.9 10*3/uL (ref 0.7–4.0)
MCH: 28.8 pg (ref 26.0–34.0)
MCHC: 31.8 g/dL (ref 30.0–36.0)
MCV: 90.3 fL (ref 80.0–100.0)
Monocytes Absolute: 0.5 10*3/uL (ref 0.1–1.0)
Monocytes Relative: 10 %
Neutro Abs: 3.7 10*3/uL (ref 1.7–7.7)
Neutrophils Relative %: 68 %
Platelets: 241 10*3/uL (ref 150–400)
RBC: 3.93 MIL/uL (ref 3.87–5.11)
RDW: 12.6 % (ref 11.5–15.5)
WBC: 5.4 10*3/uL (ref 4.0–10.5)
nRBC: 0 % (ref 0.0–0.2)

## 2023-03-21 LAB — COMPREHENSIVE METABOLIC PANEL
ALT: 9 U/L (ref 0–44)
AST: 15 U/L (ref 15–41)
Albumin: 4.1 g/dL (ref 3.5–5.0)
Alkaline Phosphatase: 60 U/L (ref 38–126)
Anion gap: 9 (ref 5–15)
BUN: 17 mg/dL (ref 8–23)
CO2: 24 mmol/L (ref 22–32)
Calcium: 9 mg/dL (ref 8.9–10.3)
Chloride: 105 mmol/L (ref 98–111)
Creatinine, Ser: 1.08 mg/dL — ABNORMAL HIGH (ref 0.44–1.00)
GFR, Estimated: 54 mL/min — ABNORMAL LOW (ref >60)
Glucose, Bld: 142 mg/dL — ABNORMAL HIGH (ref 70–99)
Potassium: 3.3 mmol/L — ABNORMAL LOW (ref 3.5–5.1)
Sodium: 138 mmol/L (ref 135–145)
Total Bilirubin: 0.8 mg/dL (ref 0.3–1.2)
Total Protein: 7.1 g/dL (ref 6.5–8.1)

## 2023-03-21 LAB — CBG MONITORING, ED: Glucose-Capillary: 129 mg/dL — ABNORMAL HIGH (ref 70–99)

## 2023-03-21 LAB — TROPONIN I (HIGH SENSITIVITY): Troponin I (High Sensitivity): 6 ng/L (ref <18)

## 2023-03-21 NOTE — ED Triage Notes (Addendum)
Pt brought in by daughter with confusion.  Sx began at lnoon today.  Pt denies any pain.  No falls.  Family report possible uti sx.  Hx diabetes .  Pt alert.  Pt confused to time and place.  Fsbs 129 in triage. Family reports hx dementia

## 2023-03-22 ENCOUNTER — Observation Stay: Payer: PPO

## 2023-03-22 ENCOUNTER — Emergency Department: Payer: PPO

## 2023-03-22 ENCOUNTER — Inpatient Hospital Stay
Admission: EM | Admit: 2023-03-22 | Discharge: 2023-03-24 | DRG: 177 | Disposition: A | Discharge status: Home Health | Payer: PPO | Attending: Student | Admitting: Student

## 2023-03-22 ENCOUNTER — Ambulatory Visit: Payer: PPO | Admitting: Gastroenterology

## 2023-03-22 DIAGNOSIS — G9341 Metabolic encephalopathy: Secondary | ICD-10-CM | POA: Diagnosis present

## 2023-03-22 DIAGNOSIS — U071 COVID-19: Principal | ICD-10-CM

## 2023-03-22 DIAGNOSIS — I160 Hypertensive urgency: Secondary | ICD-10-CM | POA: Insufficient documentation

## 2023-03-22 DIAGNOSIS — G9340 Encephalopathy, unspecified: Secondary | ICD-10-CM

## 2023-03-22 DIAGNOSIS — I10 Essential (primary) hypertension: Secondary | ICD-10-CM

## 2023-03-22 DIAGNOSIS — E119 Type 2 diabetes mellitus without complications: Secondary | ICD-10-CM

## 2023-03-22 DIAGNOSIS — G9349 Other encephalopathy: Secondary | ICD-10-CM

## 2023-03-22 LAB — CBG MONITORING, ED
Glucose-Capillary: 150 mg/dL — ABNORMAL HIGH (ref 70–99)
Glucose-Capillary: 112 mg/dL — ABNORMAL HIGH (ref 70–99)
Glucose-Capillary: 114 mg/dL — ABNORMAL HIGH (ref 70–99)

## 2023-03-22 LAB — BASIC METABOLIC PANEL
Anion gap: 11 (ref 5–15)
BUN: 16 mg/dL (ref 8–23)
CO2: 23 mmol/L (ref 22–32)
Calcium: 9.1 mg/dL (ref 8.9–10.3)
Chloride: 105 mmol/L (ref 98–111)
Creatinine, Ser: 1.06 mg/dL — ABNORMAL HIGH (ref 0.44–1.00)
GFR, Estimated: 55 mL/min — ABNORMAL LOW (ref >60)
Glucose, Bld: 151 mg/dL — ABNORMAL HIGH (ref 70–99)
Potassium: 3.5 mmol/L (ref 3.5–5.1)
Sodium: 139 mmol/L (ref 135–145)

## 2023-03-22 LAB — IRON AND TIBC
Iron: 20 ug/dL — ABNORMAL LOW (ref 28–170)
Saturation Ratios: 6 % — ABNORMAL LOW (ref 10.4–31.8)
TIBC: 346 ug/dL (ref 250–450)
UIBC: 326 ug/dL

## 2023-03-22 LAB — URINALYSIS, ROUTINE W REFLEX MICROSCOPIC
Bacteria, UA: NONE SEEN
Bilirubin Urine: NEGATIVE
Glucose, UA: NEGATIVE mg/dL
Hgb urine dipstick: NEGATIVE
Ketones, ur: NEGATIVE mg/dL
Leukocytes,Ua: NEGATIVE
Nitrite: NEGATIVE
Protein, ur: 30 mg/dL — AB
Specific Gravity, Urine: 1.018 (ref 1.005–1.030)
pH: 6 (ref 5.0–8.0)

## 2023-03-22 LAB — CBC
HCT: 37.9 % (ref 36.0–46.0)
Hemoglobin: 12.1 g/dL (ref 12.0–15.0)
MCH: 28.7 pg (ref 26.0–34.0)
MCHC: 31.9 g/dL (ref 30.0–36.0)
MCV: 90 fL (ref 80.0–100.0)
Platelets: 234 10*3/uL (ref 150–400)
RBC: 4.21 MIL/uL (ref 3.87–5.11)
RDW: 12.6 % (ref 11.5–15.5)
WBC: 6.1 10*3/uL (ref 4.0–10.5)
nRBC: 0 % (ref 0.0–0.2)

## 2023-03-22 LAB — LACTIC ACID, PLASMA
Lactic Acid, Venous: 0.9 mmol/L (ref 0.5–1.9)
Lactic Acid, Venous: 0.8 mmol/L (ref 0.5–1.9)

## 2023-03-22 LAB — MAGNESIUM: Magnesium: 2.2 mg/dL (ref 1.7–2.4)

## 2023-03-22 LAB — FERRITIN: Ferritin: 54 ng/mL (ref 11–307)

## 2023-03-22 LAB — FOLATE: Folate: 13.1 ng/mL (ref >5.9)

## 2023-03-22 LAB — RETICULOCYTES
Immature Retic Fract: 11 % (ref 2.3–15.9)
RBC.: 4.16 MIL/uL (ref 3.87–5.11)
Retic Count, Absolute: 50.3 10*3/uL (ref 19.0–186.0)
Retic Ct Pct: 1.2 % (ref 0.4–3.1)

## 2023-03-22 LAB — PHOSPHORUS: Phosphorus: 3.2 mg/dL (ref 2.5–4.6)

## 2023-03-22 LAB — SARS CORONAVIRUS 2 BY RT PCR: SARS Coronavirus 2 by RT PCR: POSITIVE — AB

## 2023-03-22 LAB — TROPONIN I (HIGH SENSITIVITY): Troponin I (High Sensitivity): 8 ng/L (ref <18)

## 2023-03-22 LAB — RPR: RPR Ser Ql: NONREACTIVE

## 2023-03-22 LAB — VITAMIN B12: Vitamin B-12: 247 pg/mL (ref 180–914)

## 2023-03-22 LAB — TSH: TSH: 0.863 u[IU]/mL (ref 0.350–4.500)

## 2023-03-22 LAB — CREATININE, SERUM
Creatinine, Ser: 1.08 mg/dL — ABNORMAL HIGH (ref 0.44–1.00)
GFR, Estimated: 54 mL/min — ABNORMAL LOW (ref >60)

## 2023-03-22 LAB — GLUCOSE, CAPILLARY: Glucose-Capillary: 139 mg/dL — ABNORMAL HIGH (ref 70–99)

## 2023-03-22 LAB — HIV ANTIBODY (ROUTINE TESTING W REFLEX): HIV Screen 4th Generation wRfx: NONREACTIVE

## 2023-03-22 LAB — PROCALCITONIN: Procalcitonin: <0.1 ng/mL

## 2023-03-22 MED ORDER — ACETAMINOPHEN 650 MG RE SUPP: 650.0000 mg | Freq: Four times a day (QID) | RECTAL | Status: DC | PRN

## 2023-03-22 MED ORDER — SODIUM CHLORIDE 0.9 % IV SOLN: INTRAVENOUS | Status: AC

## 2023-03-22 MED ORDER — SODIUM CHLORIDE 0.9 % IV SOLN
200.0000 mg | Freq: Every day | INTRAVENOUS | Status: DC
  Administered 2023-03-22 – 2023-03-24 (×3): 200 mg via INTRAVENOUS
  Filled 2023-03-22 (×3): qty 200

## 2023-03-22 MED ORDER — LISINOPRIL 20 MG PO TABS
20.0000 mg | ORAL_TABLET | Freq: Every day | ORAL | Status: DC
  Administered 2023-03-22 – 2023-03-24 (×3): 20 mg via ORAL
  Filled 2023-03-22 (×2): qty 1
  Filled 2023-03-22: qty 2

## 2023-03-22 MED ORDER — POLYSACCHARIDE IRON COMPLEX 150 MG PO CAPS: 150.0000 mg | ORAL_CAPSULE | Freq: Every day | ORAL | Status: DC

## 2023-03-22 MED ORDER — IOHEXOL 300 MG/ML  SOLN
100.0000 mL | Freq: Once | INTRAMUSCULAR | Status: AC | PRN
  Administered 2023-03-22: 100 mL via INTRAVENOUS

## 2023-03-22 MED ORDER — ENOXAPARIN SODIUM 40 MG/0.4ML IJ SOSY
40.0000 mg | PREFILLED_SYRINGE | INTRAMUSCULAR | Status: DC
  Administered 2023-03-22 – 2023-03-24 (×3): 40 mg via SUBCUTANEOUS
  Filled 2023-03-22 (×3): qty 0.4

## 2023-03-22 MED ORDER — CYANOCOBALAMIN 1000 MCG/ML IJ SOLN
1000.0000 ug | Freq: Every day | INTRAMUSCULAR | Status: DC
  Administered 2023-03-22 – 2023-03-24 (×3): 1000 ug via INTRAMUSCULAR
  Filled 2023-03-22 (×3): qty 1

## 2023-03-22 MED ORDER — HYDRALAZINE HCL 20 MG/ML IJ SOLN
10.0000 mg | Freq: Four times a day (QID) | INTRAMUSCULAR | Status: DC | PRN
  Administered 2023-03-23: 10 mg via INTRAVENOUS
  Filled 2023-03-22: qty 1

## 2023-03-22 MED ORDER — LORAZEPAM 2 MG/ML IJ SOLN
0.5000 mg | Freq: Once | INTRAMUSCULAR | Status: AC | PRN
  Administered 2023-03-22: 0.5 mg via INTRAVENOUS
  Filled 2023-03-22: qty 1

## 2023-03-22 MED ORDER — ACETAMINOPHEN 325 MG PO TABS
650.0000 mg | ORAL_TABLET | Freq: Four times a day (QID) | ORAL | Status: DC | PRN
  Administered 2023-03-22: 650 mg via ORAL
  Filled 2023-03-22: qty 2

## 2023-03-22 MED ORDER — LORAZEPAM 2 MG/ML PO CONC: 0.5000 mg | Freq: Once | ORAL | Status: DC | PRN

## 2023-03-22 MED ORDER — VITAMIN B-12 1000 MCG PO TABS: 1000.0000 ug | ORAL_TABLET | Freq: Every day | ORAL | Status: DC

## 2023-03-22 MED ORDER — ONDANSETRON HCL 4 MG/2ML IJ SOLN
4.0000 mg | Freq: Four times a day (QID) | INTRAMUSCULAR | Status: DC | PRN
  Administered 2023-03-23: 4 mg via INTRAVENOUS
  Filled 2023-03-22: qty 2

## 2023-03-22 MED ORDER — INSULIN ASPART 100 UNIT/ML IJ SOLN
0.0000 [IU] | Freq: Three times a day (TID) | INTRAMUSCULAR | Status: DC
  Administered 2023-03-22: 2 [IU] via SUBCUTANEOUS
  Administered 2023-03-23: 3 [IU] via SUBCUTANEOUS
  Administered 2023-03-23: 2 [IU] via SUBCUTANEOUS
  Administered 2023-03-23: 3 [IU] via SUBCUTANEOUS
  Administered 2023-03-24: 2 [IU] via SUBCUTANEOUS
  Filled 2023-03-22 (×5): qty 1

## 2023-03-22 MED ORDER — INSULIN ASPART 100 UNIT/ML IJ SOLN: 0.0000 [IU] | Freq: Every day | INTRAMUSCULAR | Status: DC

## 2023-03-22 MED ORDER — ONDANSETRON HCL 4 MG PO TABS: 4.0000 mg | ORAL_TABLET | Freq: Four times a day (QID) | ORAL | Status: DC | PRN

## 2023-03-22 MED ORDER — AMLODIPINE BESYLATE 5 MG PO TABS
5.0000 mg | ORAL_TABLET | Freq: Every day | ORAL | Status: DC
  Administered 2023-03-22 – 2023-03-23 (×2): 5 mg via ORAL
  Filled 2023-03-22 (×2): qty 1

## 2023-03-22 MED ORDER — FENTANYL CITRATE PF 50 MCG/ML IJ SOSY: 50.0000 ug | PREFILLED_SYRINGE | Freq: Once | INTRAMUSCULAR | Status: DC

## 2023-03-22 MED ORDER — HYDRALAZINE HCL 50 MG PO TABS
50.0000 mg | ORAL_TABLET | Freq: Four times a day (QID) | ORAL | Status: DC | PRN
  Administered 2023-03-22: 50 mg via ORAL
  Filled 2023-03-22: qty 1

## 2023-03-22 NOTE — ED Notes (Signed)
Purewick removed. Pt dry at time of departure from ED. Stable at time of departure.

## 2023-03-22 NOTE — ED Notes (Signed)
Transport put in for patient  °

## 2023-03-22 NOTE — ED Notes (Signed)
Pt sleeping at this time. Will defer head to toe assessment until pt is awake. Respirations are equal and unlabored. Pt in Sinus rhythm on the monitor. No distress noted. Family at bedside. Updated on plan of care.

## 2023-03-22 NOTE — Assessment & Plan Note (Addendum)
COVID infection as possible etiology of encephalopathy Suspect delirium and possible underlying cognitive deficits/possible dementia Patient presents with acute confusion however PCP records from January 2024 with comments on similar confusion and behavioral changes CT head showing cerebral atrophy with chronic white matter small vessel ischemic changes Blood pressure elevated so PRES is among the differential also might get MRI COVID positive but other workup unremarkable Neurologic checks Delirium precautions Will get B12, RPR, TSH, anemia panel

## 2023-03-22 NOTE — Plan of Care (Signed)
Patient was seen and examined at bedside, admitted last night due to altered mental status secondary to COVID viral infection.  No respiratory distress saturating well on room air.  Patient is still very altered, knows only her name.  We will continue current treatment and manage hypertension.  Started IV fluid for gentle hydration.  Will continue current treatment and follow along.

## 2023-03-22 NOTE — Assessment & Plan Note (Signed)
BP 179/101 with new confusion.  PCP records revealing poor compliance with medication Will get MRI brain to evaluate for PRES

## 2023-03-22 NOTE — ED Notes (Signed)
Meal tray delivered at this time. Family at bedside to help patient eat

## 2023-03-22 NOTE — ED Notes (Signed)
Meal tray delivered. Family at bedside to assist patient

## 2023-03-22 NOTE — ED Provider Notes (Signed)
[facility] Provider Note    Event Date/Time   First MD Initiated Contact with Patient 03/22/23 [phone]     (approximate)   History   Altered Mental Status   HPI  Anne Parrish is a 72 y.o. female who presents to the ED for evaluation of Altered Mental Status   I review a PCP visit from January.  History of HTN, HLD and DM.  GERD.  Son and daughter bring patient to the ED for elevation of confusion, altered mentation and "talking out of her head."  Symptoms in the past 12-24 hours.  Patient lives at home with an older long-term female friend.  Daughter saw her first and this morning to take her to a routine doctor's visit she seemed "a little bit confused" but it was nothing terribly unusual.  Daughter left midmorning and the son came by around 61 AM to have her sign some papers and she was obviously unusual and confused.  Her signature was "like a child."   Daughter returned around 5 or 6 PM to deliver supper and she was obviously very different than earlier in the morning when she saw her.  Rambling, confused and not making sense.  Some reports from the daughter that patient was complaining of urinary changes, but uncertain.  No known fevers, emesis, falls.   Patient is encephalopathic and cannot provide any relevant history   Physical Exam   Triage Vital Signs: ED Triage Vitals  Enc Vitals Group     BP 03/21/23 2051 (!) 170/101     Pulse Rate 03/21/23 2037 75     Resp 03/21/23 2037 20     Temp 03/21/23 2037 98.7 F (37.1 C)     Temp Source 03/21/23 2037 Oral     SpO2 03/21/23 2037 98 %     Weight 03/21/23 2038 176 lb 5.9 oz (80 kg)     Height 03/21/23 2038 5\' 3"  (1.6 m)     Head Circumference --      Peak Flow --      Pain Score 03/21/23 2038 0     Pain Loc --      Pain Edu? --      Excl. in GC? --     Most recent vital signs: Vitals:   03/22/23 0130 03/22/23 0139  BP: (!) 179/79   Pulse: 76   Resp: 19   Temp:  99.4 F (37.4 C)   SpO2:      General: Awake, no distress.  CV:  Good peripheral perfusion.  Resp:  Normal effort.  Abd:  No distention.  MSK:  No deformity noted.  Neuro:  No focal deficits appreciated. Other:     ED Results / Procedures / Treatments   Labs (all labs ordered are listed, but only abnormal results are displayed) Labs Reviewed  SARS CORONAVIRUS 2 BY RT PCR - Abnormal; Notable for the following components:      Result Value   SARS Coronavirus 2 by RT PCR POSITIVE (*)    All other components within normal limits  COMPREHENSIVE METABOLIC PANEL - Abnormal; Notable for the following components:   Potassium 3.3 (*)    Glucose, Bld 142 (*)    Creatinine, Ser 1.08 (*)    GFR, Estimated 54 (*)    All other components within normal limits  CBC WITH DIFFERENTIAL/PLATELET - Abnormal; Notable for the following components:   Hemoglobin 11.3 (*)    HCT 35.5 (*)    All  other components within normal limits  URINALYSIS, ROUTINE W REFLEX MICROSCOPIC - Abnormal; Notable for the following components:   Color, Urine YELLOW (*)    APPearance CLEAR (*)    Protein, ur 30 (*)    All other components within normal limits  CBG MONITORING, ED - Abnormal; Notable for the following components:   Glucose-Capillary 129 (*)    All other components within normal limits  CULTURE, BLOOD (ROUTINE X 2)  CULTURE, BLOOD (ROUTINE X 2)  LACTIC ACID, PLASMA  PROCALCITONIN  LACTIC ACID, PLASMA  TROPONIN I (HIGH SENSITIVITY)  TROPONIN I (HIGH SENSITIVITY)    EKG Sinus rhythm with a rate of 71 bpm.  Normal axis and intervals.  No clear signs of acute ischemia.  Tremulous baseline but seems to have some nonspecific ST changes laterally and inferiorly.  RADIOLOGY CT head interpreted by me without evidence of acute intracranial pathology CXR interpreted by me without evidence of acute cardiopulmonary pathology.  Official radiology report(s): CT ABDOMEN PELVIS W CONTRAST  Result Date: 03/22/2023 CLINICAL DATA:   Encephalopathic. EXAM: CT ABDOMEN AND PELVIS WITH CONTRAST TECHNIQUE: Multidetector CT imaging of the abdomen and pelvis was performed using the standard protocol following bolus administration of intravenous contrast. RADIATION DOSE REDUCTION: This exam was performed according to the departmental dose-optimization program which includes automated exposure control, adjustment of the mA and/or kV according to patient size and/or use of iterative reconstruction technique. CONTRAST:  OMNIPAQUE IOHEXOL 300 MG/ML  SOLN COMPARISON:  None Available. FINDINGS: Lower chest: No acute abnormality. Hepatobiliary: Multiple small gallstones are present. There is no biliary ductal dilatation. The liver is within normal limits. Pancreas: Unremarkable. No pancreatic ductal dilatation or surrounding inflammatory changes. Spleen: Normal in size without focal abnormality. Adrenals/Urinary Tract: Adrenal glands are unremarkable. Kidneys are normal, without renal calculi, focal lesion, or hydronephrosis. Bladder is unremarkable. Stomach/Bowel: Stomach is within normal limits. Appendix is not seen. No evidence of bowel wall thickening, distention, or inflammatory changes. Vascular/Lymphatic: Aortic atherosclerosis. No enlarged abdominal or pelvic lymph nodes. Reproductive: Uterus and bilateral adnexa are unremarkable. Other: No abdominal wall hernia or abnormality. No abdominopelvic ascites. Musculoskeletal: Multilevel degenerative changes affect the spine. There is mild chronic compression deformity of T12. The bones are osteopenic. IMPRESSION: 1. No acute localizing process in the abdomen or pelvis. 2. Cholelithiasis. Aortic Atherosclerosis (ICD10-I70.0). Electronically Signed   By: Darliss Cheney M.D.   On: 03/22/2023 02:53   CT Head Wo Contrast  Result Date: 03/21/2023 CLINICAL DATA:  Altered mental status. EXAM: CT HEAD WITHOUT CONTRAST TECHNIQUE: Contiguous axial images were obtained from the base of the skull through the  vertex without intravenous contrast. RADIATION DOSE REDUCTION: This exam was performed according to the departmental dose-optimization program which includes automated exposure control, adjustment of the mA and/or kV according to patient size and/or use of iterative reconstruction technique. COMPARISON:  None Available. FINDINGS: Brain: There is moderate severity cerebral atrophy with widening of the extra-axial spaces and ventricular dilatation. There are areas of decreased attenuation within the white matter tracts of the supratentorial brain, consistent with microvascular disease changes. Vascular: No hyperdense vessel or unexpected calcification. Skull: Normal. Negative for fracture or focal lesion. Sinuses/Orbits: No acute finding. Other: None. IMPRESSION: 1. Generalized cerebral atrophy with chronic white matter small vessel ischemic changes. 2. No acute intracranial abnormality. Electronically Signed   By: Aram Candela M.D.   On: 03/21/2023 21:19   DG Chest 2 View  Result Date: 03/21/2023 CLINICAL DATA:  Confusion. EXAM: CHEST - 2 VIEW  COMPARISON:  None Available. FINDINGS: The cardiomediastinal contours are normal. The lungs are clear. Pulmonary vasculature is normal. No consolidation, pleural effusion, or pneumothorax. Chronic left shoulder arthropathy. No acute osseous abnormalities are seen. IMPRESSION: No acute chest findings. Electronically Signed   By: Narda Rutherford M.D.   On: 03/21/2023 21:15    PROCEDURES and INTERVENTIONS:  .1-3 Lead EKG Interpretation  Performed by: Delton Prairie, MD Authorized by: Delton Prairie, MD     Interpretation: normal     ECG rate:  70   ECG rate assessment: normal     Rhythm: sinus rhythm     Ectopy: none     Conduction: normal     Medications  fentaNYL (SUBLIMAZE) injection 50 mcg (has no administration in time range)  iohexol (OMNIPAQUE) 300 MG/ML solution 100 mL (100 mLs Intravenous Contrast Given 03/22/23 0208)     IMPRESSION / MDM /  ASSESSMENT AND PLAN / ED COURSE  I reviewed the triage vital signs and the nursing notes.  Differential diagnosis includes, but is not limited to, sepsis, stroke, metabolic encephalopathy  {Patient presents with symptoms of an acute illness or injury that is potentially life-threatening.  Patient presents with 12-24 hours of nonfocal encephalopathy tested positive for COVID-19 and possibly due to COVID-19, requiring medical admissions.  Reassuring workup otherwise with reassuring CT head, CXR and CT abdomen.  Blood work is benign with a normal white count and negative procalcitonin.  Urine without infectious features.  Possibly due to COVID-19 acutely.  Will consult medicine for admission.      FINAL CLINICAL IMPRESSION(S) / ED DIAGNOSES   Final diagnoses:  COVID  Acute encephalopathy  Encephalopathy due to COVID-19 virus     Rx / DC Orders   ED Discharge Orders     None        Note:  This document was prepared using Dragon voice recognition software and may include unintentional dictation errors.   Delton Prairie, MD 03/22/23 [phone]

## 2023-03-22 NOTE — Progress Notes (Signed)
PT Cancellation Note  Patient Details Name: Anne Parrish MRN: [id number] DOB: 11/30/1950   Cancelled Treatment:    Reason Eval/Treat Not Completed: Other (comment): Pt's nurse requested PT to hold this date secondary to concerns that pt's current level of AMS would result in pt being unable to follow commands for safe mobility. Will attempt to see pt at a future date/time as medically appropriate.      Ovidio Hanger PT, DPT 03/22/23, 2:51 PM

## 2023-03-22 NOTE — ED Notes (Signed)
RN noted that pt did not have any urine draining from purewick. Upon assessment, pts purewick had become dislodged. Pts bed and purewick were changed by this RN and Ship broker.

## 2023-03-22 NOTE — ED Notes (Signed)
Pt to MRI - primary RN made aware.

## 2023-03-22 NOTE — ED Notes (Signed)
Full bedding/gown change.

## 2023-03-22 NOTE — Evaluation (Signed)
Occupational Therapy Evaluation Patient Details Name: Anne Parrish MRN: [id number] DOB: May 27, 1951 Today's Date: 03/22/2023   History of Present Illness 72 y.o. female with medical history significant for Diabetes and hypertension who presents to the ED for evaluation of confusion.  Family noted that for the past 24 to 48 hours patient was more confused, not making sense and talking out of her head.  No documented past history of same however on review of her PCP note from January 2024 on review of systems it is noted positive for confusion and behavioral problems and a B12 and TSH were ordered, both of which resulted normal. Pt found to be covid positive.   Clinical Impression   Patient presenting with decreased Ind in self care,balance, functional mobility/transfers, endurance, and safety awareness. Patient having difficulty answering questions related to PLOF and home set up and looks to family in the room for answers. Pt's son and daughter present and report she lives with female friend and is Ind without AD at baseline. Pt appears internally and externally distracted throughout. She needs max multimodal cuing to sequence and initiate any movement during session. Pt stands and attempts side steps but needs max A to perform and cues to keep eyes open throughout. Pt needing max- total A to return to supine.  Pt is far from baseline. Patient will benefit from acute OT to increase overall independence in the areas of ADLs, functional mobility, and safety awareness in order to safely discharge.     Recommendations for follow up therapy are one component of a multi-disciplinary discharge planning process, led by the attending physician.  Recommendations may be updated based on patient status, additional functional criteria and insurance authorization.   Assistance Recommended at Discharge Intermittent Supervision/Assistance  Patient can return home with the following A lot of help with walking and/or  transfers;A lot of help with bathing/dressing/bathroom;Assistance with cooking/housework;Assist for transportation;Help with stairs or ramp for entrance;Direct supervision/assist for financial management;Direct supervision/assist for medications management    Functional Status Assessment  Patient has had a recent decline in their functional status and demonstrates the ability to make significant improvements in function in a reasonable and predictable amount of time.  Equipment Recommendations  Other (comment) (defer to next venue of care)    Recommendations for Other Services       Precautions / Restrictions Precautions Precautions: Fall      Mobility Bed Mobility Overal bed mobility: Needs Assistance Bed Mobility: Sit to Supine, Supine to Sit     Supine to sit: Max assist Sit to supine: Max assist   General bed mobility comments: Pt unable to follow commands and sequence tasks without physical assistance    Transfers Overall transfer level: Needs assistance Equipment used: 1 person hand held assist Transfers: Sit to/from Stand Sit to Stand: Mod assist                  Balance Overall balance assessment: Needs assistance Sitting-balance support: Bilateral upper extremity supported Sitting balance-Leahy Scale: Fair Sitting balance - Comments: min guard posterior bias   Standing balance support: Bilateral upper extremity supported Standing balance-Leahy Scale: Poor                             ADL either performed or assessed with clinical judgement   ADL Overall ADL's : Needs assistance/impaired  Lower Body Dressing: Maximal assistance;Sit to/from stand       Toileting- Architect and Hygiene: Moderate assistance;Maximal assistance Toileting - Clothing Manipulation Details (indicate cue type and reason): simulated transfer             Vision Baseline Vision/History: 1 Wears glasses Patient Visual  Report: No change from baseline              Pertinent Vitals/Pain Pain Assessment Pain Assessment: Faces Faces Pain Scale: Hurts little more Pain Location: headache Pain Descriptors / Indicators: Aching Pain Intervention(s): Premedicated before session     Hand Dominance Right   Extremity/Trunk Assessment Upper Extremity Assessment Upper Extremity Assessment: Generalized weakness   Lower Extremity Assessment Lower Extremity Assessment: Generalized weakness       Communication Communication Communication: No difficulties   Cognition Arousal/Alertness: Awake/alert Behavior During Therapy: Flat affect Overall Cognitive Status: Impaired/Different from baseline                                 General Comments: Pt oriented to self and location. Pt appears internally/externally distracted by environment. Max multimodal cues and increased time for sequencing of tasks with multiple cues to open task.                Home Living Family/patient expects to be discharged to:: Private residence Living Arrangements: Other relatives ("female friend") Available Help at Discharge: Family;Available PRN/intermittently Type of Home: House Home Access: Ramped entrance   Entrance Stairs-Rails: Right;Left Home Layout: One level     Bathroom Shower/Tub: Chief Strategy Officer: Standard     Home Equipment: Agricultural consultant (2 wheels);Cane - single point          Prior Functioning/Environment Prior Level of Function : Independent/Modified Independent                        OT Problem List: Decreased strength;Pain;Decreased activity tolerance;Decreased safety awareness;Impaired balance (sitting and/or standing);Decreased knowledge of use of DME or AE;Decreased cognition      OT Treatment/Interventions: Self-care/ADL training;Therapeutic exercise;Therapeutic activities;DME and/or AE instruction;Balance training;Patient/family education;Energy  conservation    OT Goals(Current goals can be found in the care plan section) Acute Rehab OT Goals Patient Stated Goal: to get stronger OT Goal Formulation: With patient/family Time For Goal Achievement: 04/05/23 Potential to Achieve Goals: Fair ADL Goals Pt Will Perform Grooming: with min guard assist;standing Pt Will Perform Lower Body Dressing: sit to/from stand;with min assist Pt Will Transfer to Toilet: ambulating;with min assist Pt Will Perform Toileting - Clothing Manipulation and hygiene: with min assist;sit to/from stand  OT Frequency: Min 1X/week       AM-PAC OT "6 Clicks" Daily Activity     Outcome Measure Help from another person eating meals?: A Little Help from another person taking care of personal grooming?: A Little Help from another person toileting, which includes using toliet, bedpan, or urinal?: Total Help from another person bathing (including washing, rinsing, drying)?: A Lot Help from another person to put on and taking off regular upper body clothing?: A Lot Help from another person to put on and taking off regular lower body clothing?: Total 6 Click Score: 12   End of Session Nurse Communication: Mobility status  Activity Tolerance: Patient limited by fatigue Patient left: in bed;with call bell/phone within reach;with family/visitor present  OT Visit Diagnosis: Unsteadiness on feet (R26.81);Repeated falls (R29.6);Muscle weakness (generalized) (M62.81);Pain Pain -  part of body:  (headache)                Time: 1610-9604 OT Time Calculation (min): 24 min Charges:  OT General Charges $OT Visit: 1 Visit OT Evaluation $OT Eval Moderate Complexity: 1 Mod OT Treatments $Therapeutic Activity: 8-22 mins Jackquline Denmark, MS, OTR/L , CBIS ascom [phone]  03/22/23, 11:46 AM

## 2023-03-22 NOTE — ED Notes (Signed)
OT at bedside working with pt.

## 2023-03-22 NOTE — Assessment & Plan Note (Signed)
Continue lisinopril HCTZ 

## 2023-03-22 NOTE — Assessment & Plan Note (Signed)
Sliding scale insulin coverage 

## 2023-03-22 NOTE — H&P (Signed)
History and Physical    Patient: Anne Parrish UUV:[id number] DOB: 1951-07-24 DOA: 03/22/2023 DOS: the patient was seen and examined on 03/22/2023 PCP: Corky Downs, MD  Patient coming from: Home  Chief Complaint:  Chief Complaint  Patient presents with   Altered Mental Status    HPI: Anne Parrish is a 72 y.o. female with medical history significant for Diabetes and hypertension who presents to the ED for evaluation of confusion.  Family noted that for the past 24 to 48 hours patient was more confused, not making sense and talking out of her head.  No documented past history of same however on review of her PCP note from January 2024 on review of systems it is noted positive for confusion and behavioral problems and a B12 and TSH were ordered, both of which resulted normal.  No follow-up is seen after that date.  Patient otherwise has been in her usual state of health with no recent illness such as cough, fever or chills, vomiting or diarrhea or abdominal pain. ED course and data review: Tmax 99.4, blood pressure elevated at 170/101 with otherwise normal vitals.  Labs remarkable for COVID-positive but otherwise notable only for mild anemia of 11.3 creatinine of 1.08, procalcitonin less than 0.1 and normal urinalysis. EKG, personally viewed and interpreted shows NSR at 71 with no acute ST-T wave changes. CT head showed generalized cerebral atrophy with chronic white matter small vessel ischemic changes but otherwise nonacute Chest x-ray clear CT abdomen and pelvis with cholelithiasis but no acute process Observation requested     Past Medical History:  Diagnosis Date   Basal cell carcinoma 07/17/2020   R lat canthus/lower eyelid ED&C    Diabetes mellitus without complication (HCC)    Hyperlipidemia    Hypertension    Past Surgical History:  Procedure Laterality Date   APPENDECTOMY  1960's   BREAST BIOPSY Right 02/28/2018    STROMAL FIBROSIS, 5 MM, AND LOBULAR INVOLUTION.    COLONOSCOPY WITH PROPOFOL N/A 04/15/2016   Procedure: COLONOSCOPY WITH PROPOFOL;  Surgeon: Scot Jun, MD;  Location: [facility] ENDOSCOPY;  Service: Endoscopy;  Laterality: N/A;   COLONOSCOPY WITH PROPOFOL N/A 05/27/2020   Procedure: COLONOSCOPY WITH PROPOFOL;  Surgeon: Midge Minium, MD;  Location: [facility] ENDOSCOPY;  Service: Endoscopy;  Laterality: N/A;   REFRACTIVE SURGERY     TONSILLECTOMY AND ADENOIDECTOMY  1975   Social History:  reports that she has never smoked. She has never used smokeless tobacco. She reports that she does not currently use alcohol. She reports that she does not use drugs.  Allergies  Allergen Reactions   Adhesive [Tape]    Morphine And Codeine Itching   Other Other (See Comments)   Zolpidem    Benzonatate Rash   Levofloxacin Rash    Family History  Problem Relation Age of Onset   Cancer Mother        lung   Thyroid disease Mother    Cancer Father        prostate   Heart disease Father    Cancer Brother        prostate   Breast cancer Neg Hx     Prior to Admission medications   Medication Sig Start Date End Date Taking? Authorizing Provider  acetaminophen (TYLENOL) 650 MG CR tablet Take 650 mg by mouth every 8 (eight) hours as needed for pain.    [provider]  amLODipine (NORVASC) 5 MG tablet Take 1 tablet (5 mg total) by mouth daily. 09/20/22  Corky Downs, MD  Bioflavonoid Products (BIOFLEX PO) Take by mouth daily.     [provider]  glucose blood (ONETOUCH VERIO) test strip To check blood sugar once a day.  DX: E11.9 10/16/15   Lorie Phenix, MD  lisinopril-hydrochlorothiazide (ZESTORETIC) 20-12.5 MG tablet Take 1 tablet by mouth daily. 09/20/22   Corky Downs, MD  mometasone (ELOCON) 0.1 % cream Apply to aa's QD-BID PRN up to 5d/wk. 04/08/22   Deirdre Evener, MD  Multiple Vitamin (MULTIVITAMIN) capsule Take 1 capsule by mouth daily.    [provider]  omeprazole (PRILOSEC) 20 MG capsule TAKE 1 CAPSULE EVERY DAY  06/09/20   Corky Downs, MD  [facility] DELICA LANCETS FINE MISC To check blood sugars once a day.  DX:  E11.9 10/16/15   Lorie Phenix, MD  pioglitazone (ACTOS) 15 MG tablet Take 1 tablet (15 mg total) by mouth daily. 09/20/22   Corky Downs, MD    Physical Exam: Vitals:   03/21/23 2038 03/21/23 2051 03/22/23 0130 03/22/23 0139  BP:  (!) 170/101 (!) 179/79   Pulse:   76   Resp:   19   Temp:    99.4 F (37.4 C)  TempSrc:    Oral  SpO2:      Weight: 80 kg     Height: 5\' 3"  (1.6 m)      Physical Exam Vitals and nursing note reviewed.  Constitutional:      General: She is sleeping. She is not in acute distress. HENT:     Head: Normocephalic and atraumatic.  Cardiovascular:     Rate and Rhythm: Normal rate and regular rhythm.     Heart sounds: Normal heart sounds.  Pulmonary:     Effort: Pulmonary effort is normal.     Breath sounds: Normal breath sounds.  Abdominal:     Palpations: Abdomen is soft.     Tenderness: There is no abdominal tenderness.  Neurological:     Mental Status: She is easily aroused. Mental status is at baseline.     Labs on Admission: I have personally reviewed following labs and imaging studies  CBC: Recent Labs  Lab 03/21/23 2035  WBC 5.4  NEUTROABS 3.7  HGB 11.3*  HCT 35.5*  MCV 90.3  PLT 241   Basic Metabolic Panel: Recent Labs  Lab 03/21/23 2035  NA 138  K 3.3*  CL 105  CO2 24  GLUCOSE 142*  BUN 17  CREATININE 1.08*  CALCIUM 9.0   GFR: Estimated Creatinine Clearance: 46.4 mL/min (A) (by C-G formula based on SCr of 1.08 mg/dL (H)). Liver Function Tests: Recent Labs  Lab 03/21/23 2035  AST 15  ALT 9  ALKPHOS 60  BILITOT 0.8  PROT 7.1  ALBUMIN 4.1   No results for input(s): "LIPASE", "AMYLASE" in the last 168 hours. No results for input(s): "AMMONIA" in the last 168 hours. Coagulation Profile: No results for input(s): "INR", "PROTIME" in the last 168 hours. Cardiac Enzymes: No results for input(s): "CKTOTAL", "CKMB",  "CKMBINDEX", "TROPONINI" in the last 168 hours. BNP (last 3 results) No results for input(s): "PROBNP" in the last 8760 hours. HbA1C: No results for input(s): "HGBA1C" in the last 72 hours. CBG: Recent Labs  Lab 03/21/23 2038  GLUCAP 129*   Lipid Profile: No results for input(s): "CHOL", "HDL", "LDLCALC", "TRIG", "CHOLHDL", "LDLDIRECT" in the last 72 hours. Thyroid Function Tests: No results for input(s): "TSH", "T4TOTAL", "FREET4", "T3FREE", "THYROIDAB" in the last 72 hours. Anemia Panel: No results for input(s): "  VITAMINB12", "FOLATE", "FERRITIN", "TIBC", "IRON", "RETICCTPCT" in the last 72 hours. Urine analysis:    Component Value Date/Time   COLORURINE YELLOW (A) 03/22/2023 0152   APPEARANCEUR CLEAR (A) 03/22/2023 0152   LABSPEC 1.018 03/22/2023 0152   PHURINE 6.0 03/22/2023 0152   GLUCOSEU NEGATIVE 03/22/2023 0152   HGBUR NEGATIVE 03/22/2023 0152   BILIRUBINUR NEGATIVE 03/22/2023 0152   KETONESUR NEGATIVE 03/22/2023 0152   PROTEINUR 30 (A) 03/22/2023 0152   NITRITE NEGATIVE 03/22/2023 0152   LEUKOCYTESUR NEGATIVE 03/22/2023 0152    Radiological Exams on Admission: CT ABDOMEN PELVIS W CONTRAST  Result Date: 03/22/2023 CLINICAL DATA:  Encephalopathic. EXAM: CT ABDOMEN AND PELVIS WITH CONTRAST TECHNIQUE: Multidetector CT imaging of the abdomen and pelvis was performed using the standard protocol following bolus administration of intravenous contrast. RADIATION DOSE REDUCTION: This exam was performed according to the departmental dose-optimization program which includes automated exposure control, adjustment of the mA and/or kV according to patient size and/or use of iterative reconstruction technique. CONTRAST:  OMNIPAQUE IOHEXOL 300 MG/ML  SOLN COMPARISON:  None Available. FINDINGS: Lower chest: No acute abnormality. Hepatobiliary: Multiple small gallstones are present. There is no biliary ductal dilatation. The liver is within normal limits. Pancreas: Unremarkable. No  pancreatic ductal dilatation or surrounding inflammatory changes. Spleen: Normal in size without focal abnormality. Adrenals/Urinary Tract: Adrenal glands are unremarkable. Kidneys are normal, without renal calculi, focal lesion, or hydronephrosis. Bladder is unremarkable. Stomach/Bowel: Stomach is within normal limits. Appendix is not seen. No evidence of bowel wall thickening, distention, or inflammatory changes. Vascular/Lymphatic: Aortic atherosclerosis. No enlarged abdominal or pelvic lymph nodes. Reproductive: Uterus and bilateral adnexa are unremarkable. Other: No abdominal wall hernia or abnormality. No abdominopelvic ascites. Musculoskeletal: Multilevel degenerative changes affect the spine. There is mild chronic compression deformity of T12. The bones are osteopenic. IMPRESSION: 1. No acute localizing process in the abdomen or pelvis. 2. Cholelithiasis. Aortic Atherosclerosis (ICD10-I70.0). Electronically Signed   By: Darliss Cheney M.D.   On: 03/22/2023 02:53   CT Head Wo Contrast  Result Date: 03/21/2023 CLINICAL DATA:  Altered mental status. EXAM: CT HEAD WITHOUT CONTRAST TECHNIQUE: Contiguous axial images were obtained from the base of the skull through the vertex without intravenous contrast. RADIATION DOSE REDUCTION: This exam was performed according to the departmental dose-optimization program which includes automated exposure control, adjustment of the mA and/or kV according to patient size and/or use of iterative reconstruction technique. COMPARISON:  None Available. FINDINGS: Brain: There is moderate severity cerebral atrophy with widening of the extra-axial spaces and ventricular dilatation. There are areas of decreased attenuation within the white matter tracts of the supratentorial brain, consistent with microvascular disease changes. Vascular: No hyperdense vessel or unexpected calcification. Skull: Normal. Negative for fracture or focal lesion. Sinuses/Orbits: No acute finding. Other:  None. IMPRESSION: 1. Generalized cerebral atrophy with chronic white matter small vessel ischemic changes. 2. No acute intracranial abnormality. Electronically Signed   By: Aram Candela M.D.   On: 03/21/2023 21:19   DG Chest 2 View  Result Date: 03/21/2023 CLINICAL DATA:  Confusion. EXAM: CHEST - 2 VIEW COMPARISON:  None Available. FINDINGS: The cardiomediastinal contours are normal. The lungs are clear. Pulmonary vasculature is normal. No consolidation, pleural effusion, or pneumothorax. Chronic left shoulder arthropathy. No acute osseous abnormalities are seen. IMPRESSION: No acute chest findings. Electronically Signed   By: Narda Rutherford M.D.   On: 03/21/2023 21:15     Data Reviewed: Relevant notes from primary care and specialist visits, past discharge summaries as available in  EHR, including Care Everywhere. Prior diagnostic testing as pertinent to current admission diagnoses Updated medications and problem lists for reconciliation ED course, including vitals, labs, imaging, treatment and response to treatment Triage notes, nursing and pharmacy notes and ED provider's notes Notable results as noted in HPI   Assessment and Plan: * Acute metabolic encephalopathy COVID infection as possible etiology of encephalopathy Suspect delirium and possible underlying cognitive deficits/possible dementia Patient presents with acute confusion however PCP records from January 2024 with comments on similar confusion and behavioral changes CT head showing cerebral atrophy with chronic white matter small vessel ischemic changes Blood pressure elevated so PRES is among the differential also might get MRI COVID positive but other workup unremarkable Neurologic checks Delirium precautions Will get B12, RPR, TSH, anemia panel  Hypertensive urgency BP 179/101 with new confusion.  PCP records revealing poor compliance with medication Will get MRI brain to evaluate for PRES  Type 2 diabetes  mellitus without complication, without long-term current use of insulin (HCC) Sliding scale insulin coverage      DVT prophylaxis: Lovenox  Consults: none  Advance Care Planning: full code  Family Communication: son and daughter at bedside  Disposition Plan: Back to previous home environment  Severity of Illness: The appropriate patient status for this patient is OBSERVATION. Observation status is judged to be reasonable and necessary in order to provide the required intensity of service to ensure the patient's safety. The patient's presenting symptoms, physical exam findings, and initial radiographic and laboratory data in the context of their medical condition is felt to place them at decreased risk for further clinical deterioration. Furthermore, it is anticipated that the patient will be medically stable for discharge from the hospital within 2 midnights of admission.   Author: Andris Baumann, MD 03/22/2023 4:01 AM  For on call review [email or web address].

## 2023-03-23 DIAGNOSIS — Z888 Allergy status to other drugs, medicaments and biological substances status: Secondary | ICD-10-CM | POA: Diagnosis not present

## 2023-03-23 DIAGNOSIS — E876 Hypokalemia: Secondary | ICD-10-CM | POA: Diagnosis present

## 2023-03-23 DIAGNOSIS — Z91148 Patient's other noncompliance with medication regimen for other reason: Secondary | ICD-10-CM | POA: Diagnosis not present

## 2023-03-23 DIAGNOSIS — Z7984 Long term (current) use of oral hypoglycemic drugs: Secondary | ICD-10-CM | POA: Diagnosis not present

## 2023-03-23 DIAGNOSIS — I10 Essential (primary) hypertension: Secondary | ICD-10-CM | POA: Diagnosis present

## 2023-03-23 DIAGNOSIS — E669 Obesity, unspecified: Secondary | ICD-10-CM | POA: Diagnosis present

## 2023-03-23 DIAGNOSIS — G9341 Metabolic encephalopathy: Secondary | ICD-10-CM | POA: Diagnosis present

## 2023-03-23 DIAGNOSIS — Z801 Family history of malignant neoplasm of trachea, bronchus and lung: Secondary | ICD-10-CM | POA: Diagnosis not present

## 2023-03-23 DIAGNOSIS — M4802 Spinal stenosis, cervical region: Secondary | ICD-10-CM | POA: Diagnosis present

## 2023-03-23 DIAGNOSIS — E785 Hyperlipidemia, unspecified: Secondary | ICD-10-CM | POA: Diagnosis present

## 2023-03-23 DIAGNOSIS — Z85828 Personal history of other malignant neoplasm of skin: Secondary | ICD-10-CM | POA: Diagnosis not present

## 2023-03-23 DIAGNOSIS — Z79899 Other long term (current) drug therapy: Secondary | ICD-10-CM | POA: Diagnosis not present

## 2023-03-23 DIAGNOSIS — Z8349 Family history of other endocrine, nutritional and metabolic diseases: Secondary | ICD-10-CM | POA: Diagnosis not present

## 2023-03-23 DIAGNOSIS — E119 Type 2 diabetes mellitus without complications: Secondary | ICD-10-CM | POA: Diagnosis present

## 2023-03-23 DIAGNOSIS — D509 Iron deficiency anemia, unspecified: Secondary | ICD-10-CM | POA: Diagnosis present

## 2023-03-23 DIAGNOSIS — R4189 Other symptoms and signs involving cognitive functions and awareness: Secondary | ICD-10-CM | POA: Diagnosis present

## 2023-03-23 DIAGNOSIS — Z885 Allergy status to narcotic agent status: Secondary | ICD-10-CM | POA: Diagnosis not present

## 2023-03-23 DIAGNOSIS — G9340 Encephalopathy, unspecified: Secondary | ICD-10-CM | POA: Diagnosis present

## 2023-03-23 DIAGNOSIS — K8020 Calculus of gallbladder without cholecystitis without obstruction: Secondary | ICD-10-CM | POA: Diagnosis present

## 2023-03-23 DIAGNOSIS — Z8249 Family history of ischemic heart disease and other diseases of the circulatory system: Secondary | ICD-10-CM | POA: Diagnosis not present

## 2023-03-23 DIAGNOSIS — I160 Hypertensive urgency: Secondary | ICD-10-CM | POA: Diagnosis present

## 2023-03-23 DIAGNOSIS — F0390 Unspecified dementia without behavioral disturbance: Secondary | ICD-10-CM | POA: Diagnosis present

## 2023-03-23 DIAGNOSIS — M4801 Spinal stenosis, occipito-atlanto-axial region: Secondary | ICD-10-CM | POA: Diagnosis present

## 2023-03-23 DIAGNOSIS — Z6831 Body mass index (BMI) 31.0-31.9, adult: Secondary | ICD-10-CM | POA: Diagnosis not present

## 2023-03-23 DIAGNOSIS — U071 COVID-19: Secondary | ICD-10-CM | POA: Diagnosis present

## 2023-03-23 LAB — CBC
HCT: 39.2 % (ref 36.0–46.0)
Hemoglobin: 13.1 g/dL (ref 12.0–15.0)
MCH: 28.8 pg (ref 26.0–34.0)
MCHC: 33.4 g/dL (ref 30.0–36.0)
MCV: 86.2 fL (ref 80.0–100.0)
Platelets: 231 10*3/uL (ref 150–400)
RBC: 4.55 MIL/uL (ref 3.87–5.11)
RDW: 12.2 % (ref 11.5–15.5)
WBC: 6.1 10*3/uL (ref 4.0–10.5)
nRBC: 0 % (ref 0.0–0.2)

## 2023-03-23 LAB — BASIC METABOLIC PANEL
Anion gap: 9 (ref 5–15)
BUN: 13 mg/dL (ref 8–23)
CO2: 26 mmol/L (ref 22–32)
Calcium: 9.2 mg/dL (ref 8.9–10.3)
Chloride: 102 mmol/L (ref 98–111)
Creatinine, Ser: 0.76 mg/dL (ref 0.44–1.00)
GFR, Estimated: >60 mL/min (ref >60)
Glucose, Bld: 159 mg/dL — ABNORMAL HIGH (ref 70–99)
Potassium: 3.1 mmol/L — ABNORMAL LOW (ref 3.5–5.1)
Sodium: 137 mmol/L (ref 135–145)

## 2023-03-23 LAB — MAGNESIUM: Magnesium: 1.9 mg/dL (ref 1.7–2.4)

## 2023-03-23 LAB — GLUCOSE, CAPILLARY
Glucose-Capillary: 154 mg/dL — ABNORMAL HIGH (ref 70–99)
Glucose-Capillary: 161 mg/dL — ABNORMAL HIGH (ref 70–99)
Glucose-Capillary: 149 mg/dL — ABNORMAL HIGH (ref 70–99)
Glucose-Capillary: 117 mg/dL — ABNORMAL HIGH (ref 70–99)

## 2023-03-23 LAB — PHOSPHORUS: Phosphorus: 3.2 mg/dL (ref 2.5–4.6)

## 2023-03-23 MED ORDER — AMLODIPINE BESYLATE 10 MG PO TABS
10.0000 mg | ORAL_TABLET | Freq: Every day | ORAL | Status: DC
  Administered 2023-03-24: 10 mg via ORAL
  Filled 2023-03-23: qty 1

## 2023-03-23 MED ORDER — POTASSIUM CHLORIDE CRYS ER 20 MEQ PO TBCR
40.0000 meq | EXTENDED_RELEASE_TABLET | Freq: Once | ORAL | Status: AC
  Administered 2023-03-23: 40 meq via ORAL
  Filled 2023-03-23: qty 2

## 2023-03-23 MED ORDER — GLUCERNA SHAKE PO LIQD
237.0000 mL | Freq: Three times a day (TID) | ORAL | Status: DC
  Administered 2023-03-23 – 2023-03-24 (×2): 237 mL via ORAL

## 2023-03-23 MED ORDER — POTASSIUM CHLORIDE 10 MEQ/100ML IV SOLN
10.0000 meq | INTRAVENOUS | Status: AC
  Administered 2023-03-23 (×4): 10 meq via INTRAVENOUS
  Filled 2023-03-23 (×4): qty 100

## 2023-03-23 MED ORDER — AMLODIPINE BESYLATE 5 MG PO TABS
5.0000 mg | ORAL_TABLET | Freq: Every day | ORAL | Status: AC
  Administered 2023-03-23: 5 mg via ORAL
  Filled 2023-03-23: qty 1

## 2023-03-23 MED ORDER — METOCLOPRAMIDE HCL 5 MG/ML IJ SOLN: 10.0000 mg | Freq: Once | INTRAMUSCULAR | Status: DC

## 2023-03-23 NOTE — TOC Initial Note (Signed)
Transition of Care [facility]) - Initial/Assessment Note    Patient Details  Name: Anne Parrish MRN: [id number] Date of Birth: 07-21-1951  Transition of Care [facility] CM/SW Contact:    Chapman Fitch, RN Phone Number: 03/23/2023, 2:43 PM  Clinical Narrative:                  Admitted for: AMS, found to be covid positive Admitted from: home, lives with "female friend" [id number]  Therapy recommending SNF Patient with dementia VM left for daughter Archie Patten to discuss disposition Expected Discharge Plan: Skilled Nursing Facility     Patient Goals and CMS Choice            Expected Discharge Plan and Services                                              Prior Living Arrangements/Services                       Activities of Daily Living Home Assistive Devices/Equipment: Cane (specify quad or straight) ADL Screening (condition at time of admission) Patient's cognitive ability adequate to safely complete daily activities?: No Is the patient deaf or have difficulty hearing?: No Does the patient have difficulty seeing, even when wearing glasses/contacts?: No Does the patient have difficulty concentrating, remembering, or making decisions?: Yes Patient able to express need for assistance with ADLs?: No Does the patient have difficulty dressing or bathing?: Yes Independently performs ADLs?: No Communication: Needs assistance Does the patient have difficulty walking or climbing stairs?: Yes Weakness of Legs: Both Weakness of Arms/Hands: Both  Permission Sought/Granted                  Emotional Assessment              Admission diagnosis:  Acute encephalopathy [G93.40] Acute metabolic encephalopathy [G93.41] COVID [U07.1] Encephalopathy due to COVID-19 virus [U07.1, G93.49] COVID-19 virus infection [U07.1] Patient Active Problem List   Diagnosis Date Noted   Acute metabolic encephalopathy 03/22/2023   COVID-19 virus infection 03/22/2023    Hypertensive urgency 03/22/2023   Generalized edema 09/20/2022   Need for COVID-19 vaccine 08/01/2020   Personal history of colonic polyps    Annual physical exam 04/18/2020   Dermatitis 03/19/2020   Bilateral wrist pain 07/19/2019   Bilateral thumb pain 07/19/2019   Bilateral hand numbness 07/19/2019   Colon polyp 04/23/2016   Diabetes type 2, uncontrolled 10/16/2015   GERD (gastroesophageal reflux disease) 08/13/2015   Allergic rhinitis 07/18/2015   Type 2 diabetes mellitus without complication, without long-term current use of insulin (HCC) 07/18/2015   Goiter 07/18/2015   Hypercholesteremia 07/18/2015   PCP:  Corky Downs, MD Pharmacy:   [facility] - [place], [place] - [address] [place] [address] Phone: [phone] Fax: [phone]     Social Determinants of Health (SDOH) Social History: SDOH Screenings   Food Insecurity: No Food Insecurity (09/20/2022)  Housing: Low Risk  (09/20/2022)  Transportation Needs: No Transportation Needs (09/20/2022)  Utilities: Not At Risk (09/20/2022)  Alcohol Screen: Low Risk  (09/20/2022)  Depression (PHQ2-9): Low Risk  (09/20/2022)  Financial Resource Strain: Low Risk  (09/20/2022)  Physical Activity: Inactive (09/20/2022)  Social Connections: Moderately Integrated (09/20/2022)  Stress: No Stress Concern Present (09/20/2022)  Tobacco Use: Low Risk  (03/21/2023)  SDOH Interventions:     Readmission Risk Interventions     No data to display

## 2023-03-23 NOTE — Progress Notes (Signed)
[facility] Progress Note  Patient: Anne Parrish    VWU:[id number]  DOA: 03/22/2023     Date of Service: the patient was seen and examined on 03/23/2023  Chief Complaint  Patient presents with   Altered Mental Status   Brief hospital course: LELA MURFIN is a 72 y.o. female with medical history significant for Diabetes and hypertension who presents to the ED for evaluation of confusion.  Family noted that for the past 24 to 48 hours patient was more confused, not making sense and talking out of her head.  No documented past history of same however on review of her PCP note from January 2024 on review of systems it is noted positive for confusion and behavioral problems and a B12 and TSH were ordered, both of which resulted normal.  No follow-up is seen after that date.  Patient otherwise has been in her usual state of health with no recent illness such as cough, fever or chills, vomiting or diarrhea or abdominal pain. ED course and data review: Tmax 99.4, blood pressure elevated at 170/101 with otherwise normal vitals.  Labs remarkable for COVID-positive but otherwise notable only for mild anemia of 11.3 creatinine of 1.08, procalcitonin less than 0.1 and normal urinalysis. EKG, personally viewed and interpreted shows NSR at 71 with no acute ST-T wave changes. CT head showed generalized cerebral atrophy with chronic white matter small vessel ischemic changes but otherwise nonacute Chest x-ray clear CT abdomen and pelvis with cholelithiasis but no acute process TRH consulted for admission and further management as below.  Assessment and Plan:  # Acute metabolic encephalopathy most likely due to COVID viral infection Acute delirium most likely due to worsening of underlying dementia/cognitive deficit  Patient presents with acute confusion however PCP records from January 2024 with comments on similar confusion and behavioral changes CT head showing cerebral atrophy with chronic white matter  small vessel ischemic changes Blood pressure elevated so PRES is among the differential also might get MRI COVID positive but other workup unremarkable Neurologic checks, Delirium precautions B12 level 247 low, RPR NR, TSH wnl, iron deficiency transferrin saturation 6% MRI brain: No acute intracranial abnormality.  Advanced cerebral white matter disease, most commonly due to chronic small vessel ischemia. Degenerative ligamentous hypertrophy about the odontoid resulting in mild C1 level spinal stenosis.   # Hypokalemia, potassium repleted. Monitor electrolytes   # Hypertensive urgency BP 179/101 with new confusion.  PCP records revealing poor compliance with medication MRI brain as above Increased amlodipine 10 mg p.o. daily Continue lisinopril 20 mg p.o. daily Use hydralazine as needed    # Type 2 diabetes mellitus without complication, without long-term current use of insulin (HCC) Sliding scale insulin coverage  # Vitamin B12 level 247, goal >400, started vitamin B12 1000 mcg IM injection daily during hospital stay followed by oral supplement.  # Iron deficiency, transferrin saturation 6%, started Venofer 200 mg IV daily for 5 days, followed by oral iron supplement.  Follow with PCP to repeat iron profile and B12 level after 3 to 6 months.   Body mass index is 31.24 kg/m.  Interventions:  Diet: Heart healthy/carb modified diet DVT Prophylaxis: Subcutaneous Lovenox   Advance goals of care discussion: Full code  Family Communication: family was present at bedside, at the time of interview.  The pt provided permission to discuss medical plan with the family. Opportunity was given to ask question and all questions were answered satisfactorily.   Disposition:  Pt is from Home, admitted  with AMS due to COVID viral infection, hypertension, hypokalemia, still has low potassium and hypertension, AMS, which precludes a safe discharge. Discharge to home, when clinically stable.  May  need 1-2 more days  Subjective: No significant events overnight, patient's mental status is getting better gradually, AO x 2.  Denies any complaints, no chest pain or palpitations, no headache or dizziness, no abdominal pain.  Physical Exam: General: NAD, lying comfortably Appear in no distress, affect appropriate Eyes: PERRLA ENT: Oral Mucosa Clear, moist  Neck: no JVD,  Cardiovascular: S1 and S2 Present, no Murmur,  Respiratory: good respiratory effort, Bilateral Air entry equal and Decreased, no Crackles, no wheezes Abdomen: Bowel Sound present, Soft and no tenderness,  Skin: no rashes Extremities: no Pedal edema, no calf tenderness Neurologic: without any new focal findings Gait not checked due to patient safety concerns  Vitals:   03/22/23 1731 03/22/23 2110 03/23/23 0527 03/23/23 0811  BP: (!) 168/78 (!) 151/85 (!) 176/90 (!) 154/74  Pulse: 69 70 66 72  Resp: 18 20 18 19   Temp: 98.5 F (36.9 C) 98 F (36.7 C) 97.8 F (36.6 C) 98 F (36.7 C)  TempSrc:  Oral  Oral  SpO2: 97% 100% 100% 100%  Weight:      Height:        Intake/Output Summary (Last 24 hours) at 03/23/2023 1348 Last data filed at 03/23/2023 0540 Gross per 24 hour  Intake 263.98 ml  Output --  Net 263.98 ml   Filed Weights   03/21/23 2038  Weight: 80 kg    Data Reviewed: I have personally reviewed and interpreted daily labs, tele strips, imagings as discussed above. I reviewed all nursing notes, pharmacy notes, vitals, pertinent old records I have discussed plan of care as described above with RN and patient/family.  CBC: Recent Labs  Lab 03/21/23 2035 03/22/23 0435 03/23/23 0605  WBC 5.4 6.1 6.1  NEUTROABS 3.7  --   --   HGB 11.3* 12.1 13.1  HCT 35.5* 37.9 39.2  MCV 90.3 90.0 86.2  PLT 241 234 231   Basic Metabolic Panel: Recent Labs  Lab 03/21/23 2035 03/22/23 0435 03/23/23 0605  NA 138 139 137  K 3.3* 3.5 3.1*  CL 105 105 102  CO2 24 23 26   GLUCOSE 142* 151* 159*  BUN 17 16  13   CREATININE 1.08* 1.06*  1.08* 0.76  CALCIUM 9.0 9.1 9.2  MG  --  2.2 1.9  PHOS  --  3.2 3.2    Studies: No results found.  Scheduled Meds:  [START ON 03/24/2023] amLODipine  10 mg Oral Daily   cyanocobalamin  1,000 mcg Intramuscular Daily   Followed by   Melene Muller ON 03/29/2023] vitamin B-12  1,000 mcg Oral Daily   enoxaparin (LOVENOX) injection  40 mg Subcutaneous Q24H   feeding supplement (GLUCERNA SHAKE)  237 mL Oral TID BM   insulin aspart  0-15 Units Subcutaneous TID WC   insulin aspart  0-5 Units Subcutaneous QHS   [START ON 03/27/2023] iron polysaccharides  150 mg Oral Daily   lisinopril  20 mg Oral Daily   Continuous Infusions:  iron sucrose Stopped (03/22/23 1518)   potassium chloride 10 mEq (03/23/23 1334)   PRN Meds: acetaminophen **OR** acetaminophen, hydrALAZINE **OR** hydrALAZINE, ondansetron **OR** ondansetron (ZOFRAN) IV  Time spent: 55 minutes  Author: Gillis Santa. MD [facility] 03/23/2023 1:48 PM  To reach On-call, see care teams to locate the attending and reach out to them via [email or web address]. If 7PM-7AM,  please contact night-coverage If you still have difficulty reaching the attending provider, please page the [facility] (Director on Call) for [facility] on amion for assistance.

## 2023-03-23 NOTE — Progress Notes (Signed)
OT Cancellation Note  Patient Details Name: SIRENITY SHEW MRN: [id number] DOB: 01-14-1951   Cancelled Treatment:    Reason Eval/Treat Not Completed: Fatigue/lethargy limiting ability to participate;Medical issues which prohibited therapy (Pt's family at bedside reporting that pt has been frequently nauseous and vomiting today; now has fallen asleep so will defer until pt awake and feeling better.)  Alvester Morin 03/23/2023, 2:17 PM

## 2023-03-23 NOTE — Evaluation (Signed)
Physical Therapy Evaluation Patient Details Name: Anne Parrish MRN: [id number] DOB: 1951/05/01 Today's Date: 03/23/2023  History of Present Illness  72 y.o. female with medical history significant for Diabetes and hypertension who presents to the ED for evaluation of confusion.  Family noted that for the past 24 to 48 hours patient was more confused, not making sense and talking out of her head.  No documented past history of same however on review of her PCP note from January 2024 on review of systems it is noted positive for confusion and behavioral problems and a B12 and TSH were ordered, both of which resulted normal. Pt found to be covid positive.  Clinical Impression  Pt is a pleasant 72 year old female who was admitted for increasing confusion in the past 48 hours. Pt performs bed mobility mod a with HOB elevated, transfers mod A w/ 1 HHA, and ambulation mod a w/ 1 HHA. Patient requires max multimodal cueing for tasks and to encourage attention. Easily distracted by external environment. Aox2 for person and place but unsure of month/year. Ambulated 5 feet next to the bed, Cueing and assist needed for safety due to Pt being aware of current deficits. Pt returned to bed mod A with call bell, alarm set and son in room. Pt demonstrates deficits with strength, ROM, mobility, endurance, balance, safety awareness and cognition.Pt will continue to receive skilled PT services while admitted and will defer to TOC/care team for updates regarding disposition planning.         Assistance Recommended at Discharge Frequent or constant Supervision/Assistance  If plan is discharge home, recommend the following:  Can travel by private vehicle  A lot of help with walking and/or transfers;A lot of help with bathing/dressing/bathroom;Assistance with cooking/housework;Direct supervision/assist for medications management;Direct supervision/assist for financial management;Assist for transportation;Help with stairs or  ramp for entrance   No    Equipment Recommendations None recommended by PT  Recommendations for Other Services       Functional Status Assessment Patient has had a recent decline in their functional status and demonstrates the ability to make significant improvements in function in a reasonable and predictable amount of time.     Precautions / Restrictions Precautions Precautions: Fall Restrictions Weight Bearing Restrictions: No      Mobility  Bed Mobility Overal bed mobility: Needs Assistance Bed Mobility: Sit to Supine, Supine to Sit     Supine to sit: Mod assist, HOB elevated Sit to supine: Mod assist, HOB elevated   General bed mobility comments: consistent cueing needed for patient to remain attentive to task and follow commands.    Transfers Overall transfer level: Needs assistance Equipment used: 1 person hand held assist Transfers: Sit to/from Stand Sit to Stand: Mod assist                Ambulation/Gait Ambulation/Gait assistance: Mod assist Gait Distance (Feet): 5 Feet Assistive device: 1 person hand held assist Gait Pattern/deviations: Step-to pattern, Drifts right/left, Narrow base of support       General Gait Details: stepping at side of bed with 1-2 HHA. Patient impulsive and attempts to ambulate further. Difficulty maintianing attention when ambulating.  Stairs            Wheelchair Mobility     Tilt Bed    Modified Rankin (Stroke Patients Only)       Balance Overall balance assessment: Needs assistance Sitting-balance support: Bilateral upper extremity supported, Feet supported Sitting balance-Leahy Scale: Fair     Standing balance  support: Single extremity supported, During functional activity Standing balance-Leahy Scale: Poor                               Pertinent Vitals/Pain Pain Assessment Pain Assessment: No/denies pain    Home Living Family/patient expects to be discharged to:: Private  residence Living Arrangements: Other relatives Available Help at Discharge: Family;Available PRN/intermittently Type of Home: House Home Access: Ramped entrance Entrance Stairs-Rails: Right;Left     Home Layout: One level Home Equipment: Agricultural consultant (2 wheels);Cane - single point      Prior Function Prior Level of Function : Independent/Modified Independent                     Hand Dominance        Extremity/Trunk Assessment   Upper Extremity Assessment Upper Extremity Assessment: Generalized weakness    Lower Extremity Assessment Lower Extremity Assessment: Generalized weakness       Communication   Communication: No difficulties;Other (comment) (Speaks very quitely)  Cognition Arousal/Alertness: Awake/alert Behavior During Therapy: Flat affect Overall Cognitive Status: Impaired/Different from baseline Area of Impairment: Memory, Following commands, Safety/judgement, Attention                   Current Attention Level: Selective Memory: Decreased short-term memory Following Commands: Follows one step commands inconsistently Safety/Judgement: Decreased awareness of safety     General Comments: Pt oriented to self and location. Consistently distracted by environment. Max cueing needed and increased time needed for tasks.        General Comments      Exercises     Assessment/Plan    PT Assessment Patient needs continued PT services  PT Problem List Decreased strength;Decreased range of motion;Decreased activity tolerance;Decreased balance;Decreased mobility;Decreased knowledge of use of DME;Decreased safety awareness;Decreased cognition       PT Treatment Interventions DME instruction;Gait training;Stair training;Functional mobility training;Therapeutic activities;Balance training;Therapeutic exercise;Cognitive remediation;Patient/family education    PT Goals (Current goals can be found in the Care Plan section)  Acute Rehab PT  Goals Patient Stated Goal: To go home PT Goal Formulation: With patient/family Time For Goal Achievement: 03/23/23 Potential to Achieve Goals: Fair    Frequency Min 1X/week     Co-evaluation               AM-PAC PT "6 Clicks" Mobility  Outcome Measure Help needed turning from your back to your side while in a flat bed without using bedrails?: A Lot Help needed moving from lying on your back to sitting on the side of a flat bed without using bedrails?: A Lot Help needed moving to and from a bed to a chair (including a wheelchair)?: A Little Help needed standing up from a chair using your arms (e.g., wheelchair or bedside chair)?: A Little Help needed to walk in hospital room?: A Lot Help needed climbing 3-5 steps with a railing? : A Lot 6 Click Score: 14    End of Session   Activity Tolerance: Patient tolerated treatment well Patient left: in bed;with call bell/phone within reach;with bed alarm set;with family/visitor present Nurse Communication: Mobility status PT Visit Diagnosis: Unsteadiness on feet (R26.81);Other abnormalities of gait and mobility (R26.89);Muscle weakness (generalized) (M62.81);Difficulty in walking, not elsewhere classified (R26.2)    Time: 4098-1191 PT Time Calculation (min) (ACUTE ONLY): 26 min   Charges:                 Malachi Carl,  SPT   Menna Abeln 03/23/2023, 12:51 PM

## 2023-03-23 NOTE — Progress Notes (Signed)
Initial Nutrition Assessment  DOCUMENTATION CODES:   Obesity unspecified  INTERVENTION:  - Add Glucerna Shake po TID, each supplement provides 220 kcal and 10 grams of protein   NUTRITION DIAGNOSIS:   Increased nutrient needs related to acute illness as evidenced by estimated needs.  GOAL:   Patient will meet greater than or equal to 90% of their needs  MONITOR:   Supplement acceptance, PO intake  REASON FOR ASSESSMENT:   Consult Assessment of nutrition requirement/status  ASSESSMENT:   72 y.o. female admits related to AMS. PMH includes: basal cell carcinoma, DM, HLD, HTN. Pt is currently receiving medical management related to acute metabolic encephalopathy.  Meds reviewed: Vitamin B12, sliding scale insulin, klor-con, Labs reviewed: K low.   Pt is currently oriented x1. No intakes documented this admission. No significant wt loss per record. RD will add Glucerna TID with meals and continue to monitor PO intakes.   NUTRITION - FOCUSED PHYSICAL EXAM:  Pt sleeping at time of assessment - attempt at f/u.  Diet Order:   Diet Order             Diet heart healthy/carb modified Room service appropriate? Yes; Fluid consistency: Thin  Diet effective now                   EDUCATION NEEDS:   Not appropriate for education at this time  Skin:  Skin Assessment: Reviewed RN Assessment  Last BM:  7/10  Height:   Ht Readings from Last 1 Encounters:  03/21/23 5\' 3"  (1.6 m)    Weight:   Wt Readings from Last 1 Encounters:  03/21/23 80 kg    Ideal Body Weight:     BMI:  Body mass index is 31.24 kg/m.  Estimated Nutritional Needs:   Kcal:  2000-2400 kcals  Protein:  100-120 gm  Fluid:  2-2.4 L  Bethann Humble, RD, LDN, CNSC.

## 2023-03-24 DIAGNOSIS — G9341 Metabolic encephalopathy: Secondary | ICD-10-CM | POA: Diagnosis not present

## 2023-03-24 LAB — CBC
HCT: 36.9 % (ref 36.0–46.0)
Hemoglobin: 12.7 g/dL (ref 12.0–15.0)
MCH: 29.1 pg (ref 26.0–34.0)
MCHC: 34.4 g/dL (ref 30.0–36.0)
MCV: 84.4 fL (ref 80.0–100.0)
Platelets: 228 10*3/uL (ref 150–400)
RBC: 4.37 MIL/uL (ref 3.87–5.11)
RDW: 12.4 % (ref 11.5–15.5)
WBC: 5.5 10*3/uL (ref 4.0–10.5)
nRBC: 0 % (ref 0.0–0.2)

## 2023-03-24 LAB — GLUCOSE, CAPILLARY
Glucose-Capillary: 121 mg/dL — ABNORMAL HIGH (ref 70–99)
Glucose-Capillary: 115 mg/dL — ABNORMAL HIGH (ref 70–99)

## 2023-03-24 LAB — BASIC METABOLIC PANEL
Anion gap: 9 (ref 5–15)
BUN: 15 mg/dL (ref 8–23)
CO2: 22 mmol/L (ref 22–32)
Calcium: 9.2 mg/dL (ref 8.9–10.3)
Chloride: 107 mmol/L (ref 98–111)
Creatinine, Ser: 0.83 mg/dL (ref 0.44–1.00)
GFR, Estimated: >60 mL/min (ref >60)
Glucose, Bld: 149 mg/dL — ABNORMAL HIGH (ref 70–99)
Potassium: 3.5 mmol/L (ref 3.5–5.1)
Sodium: 138 mmol/L (ref 135–145)

## 2023-03-24 LAB — PHOSPHORUS: Phosphorus: 2.7 mg/dL (ref 2.5–4.6)

## 2023-03-24 LAB — MAGNESIUM: Magnesium: 1.9 mg/dL (ref 1.7–2.4)

## 2023-03-24 MED ORDER — POLYSACCHARIDE IRON COMPLEX 150 MG PO CAPS: 150.0000 mg | ORAL_CAPSULE | Freq: Every day | ORAL | 1 refills | Status: DC

## 2023-03-24 MED ORDER — LISINOPRIL 30 MG PO TABS: 30.0000 mg | ORAL_TABLET | Freq: Every day | ORAL | 5 refills | Status: DC

## 2023-03-24 MED ORDER — AMLODIPINE BESYLATE 10 MG PO TABS
10.0000 mg | ORAL_TABLET | Freq: Every day | ORAL | 5 refills | Status: DC
Start: 2023-03-24 — End: 2023-11-30

## 2023-03-24 MED ORDER — LISINOPRIL 20 MG PO TABS: 30.0000 mg | ORAL_TABLET | Freq: Every day | ORAL | Status: DC

## 2023-03-24 MED ORDER — CYANOCOBALAMIN 1000 MCG PO TABS: 1000.0000 ug | ORAL_TABLET | Freq: Every day | ORAL | 1 refills | Status: AC

## 2023-03-24 MED ORDER — POTASSIUM CHLORIDE CRYS ER 20 MEQ PO TBCR
40.0000 meq | EXTENDED_RELEASE_TABLET | Freq: Once | ORAL | Status: AC
  Administered 2023-03-24: 40 meq via ORAL
  Filled 2023-03-24: qty 2

## 2023-03-24 NOTE — Progress Notes (Signed)
Physical Therapy Treatment Patient Details Name: Anne Parrish MRN: [id number] DOB: May 12, 1951 Today's Date: 03/24/2023   History of Present Illness 72 y.o. female with medical history significant for Diabetes and hypertension who presents to the ED for evaluation of confusion.  Family noted that for the past 24 to 48 hours patient was more confused, not making sense and talking out of her head.  No documented past history of same however on review of her PCP note from January 2024 on review of systems it is noted positive for confusion and behavioral problems and a B12 and TSH were ordered, both of which resulted normal. Pt found to be covid positive.    PT Comments  Pt supine in bed with nursing during first attempt for session. Re attempted later per nursing request. Pt agreeable to session and eager to move. Min a for supine<>sit w/HOB elevated, cueing needed to grab rails. Min A for STS w/ RW, assisted with VC for sequencing.  Ambulated 27ft w/ RW min a/min guard, cueing needed for obstacle clearance. Continues to follow 1 step commands 50% of the time. Pt returned to bed with call bell, alarm set, and family in the room. Pt is progressing in established goals, continues to be Aox2 but aims to problem solve by reading white board for answers.     Assistance Recommended at Discharge Frequent or constant Supervision/Assistance  If plan is discharge home, recommend the following:  Can travel by private vehicle    A little help with walking and/or transfers;A little help with bathing/dressing/bathroom;Assistance with cooking/housework;Assist for transportation;Help with stairs or ramp for entrance;Direct supervision/assist for medications management;Direct supervision/assist for financial management   No  Equipment Recommendations  None recommended by PT    Recommendations for Other Services       Precautions / Restrictions Precautions Precautions: Fall Restrictions Weight Bearing  Restrictions: No     Mobility  Bed Mobility Overal bed mobility: Needs Assistance Bed Mobility: Sit to Supine, Supine to Sit     Supine to sit: Min assist, HOB elevated Sit to supine: Min assist, HOB elevated   General bed mobility comments: Decreased cueing needed for sequencing. Improved ability to scoot in bed and lifting legs into bed.    Transfers Overall transfer level: Needs assistance Equipment used: Rolling walker (2 wheels) Transfers: Sit to/from Stand Sit to Stand: Min assist           General transfer comment: min A needed for cueing for sequencing. no physical assistance provided.    Ambulation/Gait Ambulation/Gait assistance: Min assist, Min guard Gait Distance (Feet): 20 Feet Assistive device: Rolling walker (2 wheels) Gait Pattern/deviations: Step-to pattern, Drifts right/left, Narrow base of support       General Gait Details: Walked to bathrom and back to bed w/ RW, min a/min guard. Improved problem solving with navigating obstacles, minor VC still needed. If walking to the dor patient will try and leave the room.   Stairs             Wheelchair Mobility     Tilt Bed    Modified Rankin (Stroke Patients Only)       Balance Overall balance assessment: Needs assistance Sitting-balance support: Bilateral upper extremity supported, Feet supported Sitting balance-Leahy Scale: Good     Standing balance support: Bilateral upper extremity supported, During functional activity, Reliant on assistive device for balance Standing balance-Leahy Scale: Fair Standing balance comment: improved balance using RW insteaf of HHA.  Cognition Arousal/Alertness: Awake/alert Behavior During Therapy: WFL for tasks assessed/performed, Impulsive Overall Cognitive Status: Impaired/Different from baseline Area of Impairment: Safety/judgement, Following commands, Memory, Orientation                 Orientation  Level: Disoriented to, Time, Place Current Attention Level: Selective Memory: Decreased short-term memory Following Commands: Follows one step commands inconsistently Safety/Judgement: Decreased awareness of safety     General Comments: Pt oriented to self and location. Consistently distracted by environment. Max cueing needed and increased time needed for tasks.        Exercises      General Comments        Pertinent Vitals/Pain Pain Assessment Pain Assessment: No/denies pain    Home Living                          Prior Function            PT Goals (current goals can now be found in the care plan section) Acute Rehab PT Goals Patient Stated Goal: To go home PT Goal Formulation: With patient/family Time For Goal Achievement: 03/23/23 Potential to Achieve Goals: Fair Progress towards PT goals: Progressing toward goals    Frequency    Min 1X/week      PT Plan Current plan remains appropriate    Co-evaluation              AM-PAC PT "6 Clicks" Mobility   Outcome Measure  Help needed turning from your back to your side while in a flat bed without using bedrails?: A Lot Help needed moving from lying on your back to sitting on the side of a flat bed without using bedrails?: A Lot Help needed moving to and from a bed to a chair (including a wheelchair)?: A Little Help needed standing up from a chair using your arms (e.g., wheelchair or bedside chair)?: A Little Help needed to walk in hospital room?: A Little Help needed climbing 3-5 steps with a railing? : A Lot 6 Click Score: 15    End of Session Equipment Utilized During Treatment: Gait belt Activity Tolerance: Patient tolerated treatment well Patient left: in bed;with call bell/phone within reach;with bed alarm set;with family/visitor present Nurse Communication: Mobility status PT Visit Diagnosis: Unsteadiness on feet (R26.81);Other abnormalities of gait and mobility (R26.89);Muscle  weakness (generalized) (M62.81);Difficulty in walking, not elsewhere classified (R26.2)     Time: 0925 (930)-1015 (start: 947) PT Time Calculation (min) (ACUTE ONLY): 50 min  Charges:                            Malachi Carl, SPT    Malachi Carl 03/24/2023, 12:28 PM

## 2023-03-24 NOTE — Plan of Care (Signed)
  Problem: Education: Goal: Knowledge of General Education information will improve Description: Including pain rating scale, medication(s)/side effects and non-pharmacologic comfort measures Outcome: Adequate for Discharge   Problem: Health Behavior/Discharge Planning: Goal: Ability to manage health-related needs will improve Outcome: Adequate for Discharge   Problem: Clinical Measurements: Goal: Ability to maintain clinical measurements within normal limits will improve Outcome: Adequate for Discharge Goal: Will remain free from infection Outcome: Adequate for Discharge Goal: Diagnostic test results will improve Outcome: Adequate for Discharge Goal: Respiratory complications will improve Outcome: Adequate for Discharge Goal: Cardiovascular complication will be avoided Outcome: Adequate for Discharge   Problem: Activity: Goal: Risk for activity intolerance will decrease Outcome: Adequate for Discharge   Problem: Nutrition: Goal: Adequate nutrition will be maintained Outcome: Adequate for Discharge   Problem: Coping: Goal: Level of anxiety will decrease Outcome: Adequate for Discharge   Problem: Elimination: Goal: Will not experience complications related to bowel motility Outcome: Adequate for Discharge Goal: Will not experience complications related to urinary retention Outcome: Adequate for Discharge   Problem: Pain Managment: Goal: General experience of comfort will improve Outcome: Adequate for Discharge   Problem: Safety: Goal: Ability to remain free from injury will improve Outcome: Adequate for Discharge   Problem: Skin Integrity: Goal: Risk for impaired skin integrity will decrease Outcome: Adequate for Discharge   Pt ao x2-3, respirations even and unlabored on RA. Pt being taken home by family. Pt belongings given to family. DC paperwork reviewed with family and patient. Pt being taken down to lobby with staff via wheelchair

## 2023-03-24 NOTE — TOC Transition Note (Signed)
Transition of Care [facility]) - CM/SW Discharge Note   Patient Details  Name: Anne Parrish MRN: [id number] Date of Birth: 1951-05-24  [facility] of [facility]) CM/SW Contact:  Chapman Fitch, RN Phone Number: 03/24/2023, 2:29 PM   Clinical Narrative:      Son Jillyn Hidden and and daughter Archie Patten at bedside The confirm that patient will be returning home with her significant other Children to provide transport at discharge Son in agreement to home health.  States he does not have a preference of agency.  Referral made and accepted by Coralee North with [facility].   Son confirms patient has RW and cane in the home        Patient Goals and CMS Choice      Discharge Placement                         Discharge Plan and Services Additional resources added to the After Visit Summary for                                       Social Determinants of Health (SDOH) Interventions SDOH Screenings   Food Insecurity: No Food Insecurity (09/20/2022)  Housing: Low Risk  (09/20/2022)  Transportation Needs: No Transportation Needs (09/20/2022)  Utilities: Not At Risk (09/20/2022)  Alcohol Screen: Low Risk  (09/20/2022)  Depression (PHQ2-9): Low Risk  (09/20/2022)  Financial Resource Strain: Low Risk  (09/20/2022)  Physical Activity: Inactive (09/20/2022)  Social Connections: Moderately Integrated (09/20/2022)  Stress: No Stress Concern Present (09/20/2022)  Tobacco Use: Low Risk  (03/21/2023)     Readmission Risk Interventions     No data to display

## 2023-03-24 NOTE — Plan of Care (Signed)

## 2023-03-24 NOTE — Discharge Summary (Signed)
[facility] Discharge Summary   Patient: Anne Parrish ION:[id number]  PCP: Corky Downs, MD  Date of admission: 03/22/2023   Date of discharge:  03/24/2023     Discharge Diagnoses:  Principal Problem:   Acute metabolic encephalopathy Active Problems:   COVID-19 virus infection   Type 2 diabetes mellitus without complication, without long-term current use of insulin (HCC)   Hypertensive urgency   Admitted From: Home Disposition:  Home with [facility]  Recommendations for Outpatient Follow-up:  Follow-up with PCP in 1 week, monitor BP at home and follow with PCP to titrate medications accordingly.  Increased amlodipine, increase lisinopril and discontinued hydrochlorothiazide. Repeat iron profile and vitamin B12 level after 3 to 6 months. Follow up LABS/TEST:  as above   Diet recommendation: Cardiac and Carb modified diet  Activity: The patient is advised to gradually reintroduce usual activities, as tolerated  Discharge Condition: stable  Code Status: Full code   History of present illness: As per the H and P dictated on admission Hospital Course:  Anne Parrish is a 72 y.o. female with medical history significant for Diabetes and hypertension who presents to the ED for evaluation of confusion.  Family noted that for the past 24 to 48 hours patient was more confused, not making sense and talking out of her head.  No documented past history of same however on review of her PCP note from January 2024 on review of systems it is noted positive for confusion and behavioral problems and a B12 and TSH were ordered, both of which resulted normal.  No follow-up is seen after that date.  Patient otherwise has been in her usual state of health with no recent illness such as cough, fever or chills, vomiting or diarrhea or abdominal pain. ED course and data review: Tmax 99.4, blood pressure elevated at 170/101 with otherwise normal vitals.  Labs remarkable for COVID-positive but otherwise notable  only for mild anemia of 11.3 creatinine of 1.08, procalcitonin less than 0.1 and normal urinalysis. EKG, personally viewed and interpreted shows NSR at 71 with no acute ST-T wave changes. CT head showed generalized cerebral atrophy with chronic white matter small vessel ischemic changes but otherwise nonacute Chest x-ray clear CT abdomen and pelvis with cholelithiasis but no acute process TRH consulted for admission and further management as below.   Assessment and Plan:   # Acute metabolic encephalopathy most likely due to COVID viral infection Acute delirium most likely due to worsening of underlying dementia/cognitive deficit  Patient presents with acute confusion however PCP records from January 2024 with comments on similar confusion and behavioral changes CT head showing cerebral atrophy with chronic white matter small vessel ischemic changes Blood pressure elevated so PRES is among the differential also might get MRI COVID positive but other workup unremarkable Neurologic checks, Delirium precautions B12 level 247 low, RPR NR, TSH wnl, iron deficiency transferrin saturation 6% MRI brain: No acute intracranial abnormality.  Advanced cerebral white matter disease, most commonly due to chronic small vessel ischemia. Degenerative ligamentous hypertrophy about the odontoid resulting in mild C1 level spinal stenosis.    # Hypokalemia, potassium repleted. Resolved # Hypertensive urgency: BP 179/101 with new confusion.  PCP records revealing poor compliance with medication. MRI brain as above, Increased amlodipine 10 mg p.o. daily, increase to lisinopril 30 mg p.o. daily.  Monitor BP at home and follow with PCP to titrate medications accordingly. # Type 2 diabetes mellitus without complication, without long-term current use of insulin: s/p Sliding scale insulin  coverage.  Resume home medications, continue diabetic diet.  Follow with PCP.  Monitor CBG home. # Vitamin B12 level 247, goal >400,  started vitamin B12 1000 mcg IM injection daily during hospital stay followed by oral supplement. # Iron deficiency, transferrin saturation 6%, s/p Venofer 200 mg IV daily for 3 days, followed by oral iron supplement.  Follow with PCP to repeat iron profile and B12 level after 3 to 6 months.   Body mass index is 31.24 kg/m.  Nutrition Problem: Increased nutrient needs Etiology: acute illness Nutrition Interventions: Interventions: Glucerna shake  - Patient was instructed, not to drive, operate heavy machinery, perform activities at heights, swimming or participation in water activities or provide baby sitting services while on Pain, Sleep and Anxiety Medications; until her outpatient Physician has advised to do so again.  - Also recommended to not to take more than prescribed Pain, Sleep and Anxiety Medications.  Patient was seen by physical therapy, who recommended Home health, which was arranged. On the day of the discharge the patient's vitals were stable, and no other acute medical condition were reported by patient. the patient was felt safe to be discharge at Home with Home health.  Consultants: None Procedures: None  Discharge Exam: General: Appear in no distress, no Rash; Oral Mucosa Clear, moist. Cardiovascular: S1 and S2 Present, no Murmur, Respiratory: normal respiratory effort, Bilateral Air entry present and no Crackles, no wheezes Abdomen: Bowel Sound present, Soft and no tenderness, no hernia Extremities: no Pedal edema, no calf tenderness Neurology: alert and oriented to time, place, and person affect appropriate.  Filed Weights   03/21/23 2038  Weight: 80 kg   Vitals:   03/24/23 0359 03/24/23 0829  BP: (!) 174/66 (!) 154/64  Pulse: 68 64  Resp: 16 18  Temp: 97.9 F (36.6 C) 97.7 F (36.5 C)  SpO2: 99% 99%    DISCHARGE MEDICATION: Allergies as of 03/24/2023       Reactions   Adhesive [tape]    Morphine And Codeine Itching   Other Other (See Comments)    Zolpidem    Benzonatate Rash   Levofloxacin Rash        Medication List     STOP taking these medications    lisinopril-hydrochlorothiazide 20-12.5 MG tablet Commonly known as: Zestoretic   mometasone 0.1 % cream Commonly known as: ELOCON   omeprazole 20 MG capsule Commonly known as: PRILOSEC   potassium chloride 10 MEQ CR capsule Commonly known as: MICRO-K       TAKE these medications    acetaminophen 650 MG CR tablet Commonly known as: TYLENOL Take 650 mg by mouth every 8 (eight) hours as needed for pain.   amLODipine 10 MG tablet Commonly known as: NORVASC Take 1 tablet (10 mg total) by mouth daily. What changed:  medication strength how much to take   BIOFLEX PO Take by mouth daily.   cyanocobalamin 1000 MCG tablet Take 1 tablet (1,000 mcg total) by mouth daily. Start taking on: March 29, 2023   glucose blood test strip Commonly known as: OneTouch Verio To check blood sugar once a day.  DX: E11.9   iron polysaccharides 150 MG capsule Commonly known as: NIFEREX Take 1 capsule (150 mg total) by mouth daily. Start taking on: March 27, 2023   lisinopril 30 MG tablet Commonly known as: ZESTRIL Take 1 tablet (30 mg total) by mouth daily. Start taking on: March 25, 2023   meloxicam 7.5 MG tablet Commonly known as: MOBIC Take 7.5  mg by mouth daily.   multivitamin capsule Take 1 capsule by mouth daily.   OneTouch Delica Lancets Fine Misc To check blood sugars once a day.  DX:  E11.9   pioglitazone 15 MG tablet Commonly known as: ACTOS Take 1 tablet (15 mg total) by mouth daily.       Allergies  Allergen Reactions   Adhesive [Tape]    Morphine And Codeine Itching   Other Other (See Comments)   Zolpidem    Benzonatate Rash   Levofloxacin Rash   Discharge Instructions     Call MD for:  difficulty breathing, headache or visual disturbances   Complete by: As directed    Call MD for:  extreme fatigue   Complete by: As directed    Call MD  for:  persistant dizziness or light-headedness   Complete by: As directed    Call MD for:  persistant nausea and vomiting   Complete by: As directed    Call MD for:  severe uncontrolled pain   Complete by: As directed    Call MD for:  temperature >100.4   Complete by: As directed    Diet - low sodium heart healthy   Complete by: As directed    Discharge instructions   Complete by: As directed    Follow-up with PCP in 1 week, monitor BP at home and follow with PCP to titrate medications accordingly.  Increased amlodipine, increase lisinopril and discontinued hydrochlorothiazide. Repeat iron profile and vitamin B12 level after 3 to 6 months.   Increase activity slowly   Complete by: As directed        The results of significant diagnostics from this hospitalization (including imaging, microbiology, ancillary and laboratory) are listed below for reference.    Significant Diagnostic Studies: MR BRAIN WO CONTRAST  Result Date: 03/22/2023 CLINICAL DATA:  72 year old female with increased confusion, altered mental status, agitation. EXAM: MRI HEAD WITHOUT CONTRAST TECHNIQUE: Multiplanar, multiecho pulse sequences of the brain and surrounding structures were obtained without intravenous contrast. COMPARISON:  Head CT yesterday.  No prior brain MRI. FINDINGS: Intermittent mildly motion degraded exam despite repeated imaging attempts. Brain: No convincing restricted diffusion to suggest acute infarction. No midline shift, mass effect, evidence of mass lesion, ventriculomegaly, extra-axial collection or acute intracranial hemorrhage. Cervicomedullary junction and pituitary are within normal limits. Widespread bilateral cerebral white matter T2 and FLAIR hyperintensity in a nonspecific configuration. Confluent periventricular and subcortical areas of involvement bilaterally. No definite cortical encephalomalacia. No chronic cerebral blood products identified on T2* imaging. Comparatively mild T2  heterogeneity in the bilateral deep gray nuclei which appears in part due to perivascular spaces. Brainstem and cerebellum are within normal limits. Vascular: Major intracranial vascular flow voids are preserved. Skull and upper cervical spine: Degenerative appearing ligamentous hypertrophy about the odontoid resulting in mild C1 level spinal stenosis (series 9, image 12). Visualized bone marrow signal is within normal limits. Sinuses/Orbits: Negative. Other: Mastoids are well aerated. Grossly normal visible internal auditory structures, scalp and face soft tissues. IMPRESSION: 1. No acute intracranial abnormality. 2. Advanced cerebral white matter disease, most commonly due to chronic small vessel ischemia. 3. Degenerative ligamentous hypertrophy about the odontoid resulting in mild C1 level spinal stenosis. Electronically Signed   By: Odessa Fleming M.D.   On: 03/22/2023 05:35   CT ABDOMEN PELVIS W CONTRAST  Result Date: 03/22/2023 CLINICAL DATA:  Encephalopathic. EXAM: CT ABDOMEN AND PELVIS WITH CONTRAST TECHNIQUE: Multidetector CT imaging of the abdomen and pelvis was performed using the standard  protocol following bolus administration of intravenous contrast. RADIATION DOSE REDUCTION: This exam was performed according to the departmental dose-optimization program which includes automated exposure control, adjustment of the mA and/or kV according to patient size and/or use of iterative reconstruction technique. CONTRAST:  OMNIPAQUE IOHEXOL 300 MG/ML  SOLN COMPARISON:  None Available. FINDINGS: Lower chest: No acute abnormality. Hepatobiliary: Multiple small gallstones are present. There is no biliary ductal dilatation. The liver is within normal limits. Pancreas: Unremarkable. No pancreatic ductal dilatation or surrounding inflammatory changes. Spleen: Normal in size without focal abnormality. Adrenals/Urinary Tract: Adrenal glands are unremarkable. Kidneys are normal, without renal calculi, focal lesion, or  hydronephrosis. Bladder is unremarkable. Stomach/Bowel: Stomach is within normal limits. Appendix is not seen. No evidence of bowel wall thickening, distention, or inflammatory changes. Vascular/Lymphatic: Aortic atherosclerosis. No enlarged abdominal or pelvic lymph nodes. Reproductive: Uterus and bilateral adnexa are unremarkable. Other: No abdominal wall hernia or abnormality. No abdominopelvic ascites. Musculoskeletal: Multilevel degenerative changes affect the spine. There is mild chronic compression deformity of T12. The bones are osteopenic. IMPRESSION: 1. No acute localizing process in the abdomen or pelvis. 2. Cholelithiasis. Aortic Atherosclerosis (ICD10-I70.0). Electronically Signed   By: Darliss Cheney M.D.   On: 03/22/2023 02:53   CT Head Wo Contrast  Result Date: 03/21/2023 CLINICAL DATA:  Altered mental status. EXAM: CT HEAD WITHOUT CONTRAST TECHNIQUE: Contiguous axial images were obtained from the base of the skull through the vertex without intravenous contrast. RADIATION DOSE REDUCTION: This exam was performed according to the departmental dose-optimization program which includes automated exposure control, adjustment of the mA and/or kV according to patient size and/or use of iterative reconstruction technique. COMPARISON:  None Available. FINDINGS: Brain: There is moderate severity cerebral atrophy with widening of the extra-axial spaces and ventricular dilatation. There are areas of decreased attenuation within the white matter tracts of the supratentorial brain, consistent with microvascular disease changes. Vascular: No hyperdense vessel or unexpected calcification. Skull: Normal. Negative for fracture or focal lesion. Sinuses/Orbits: No acute finding. Other: None. IMPRESSION: 1. Generalized cerebral atrophy with chronic white matter small vessel ischemic changes. 2. No acute intracranial abnormality. Electronically Signed   By: Aram Candela M.D.   On: 03/21/2023 21:19   DG Chest 2  View  Result Date: 03/21/2023 CLINICAL DATA:  Confusion. EXAM: CHEST - 2 VIEW COMPARISON:  None Available. FINDINGS: The cardiomediastinal contours are normal. The lungs are clear. Pulmonary vasculature is normal. No consolidation, pleural effusion, or pneumothorax. Chronic left shoulder arthropathy. No acute osseous abnormalities are seen. IMPRESSION: No acute chest findings. Electronically Signed   By: Narda Rutherford M.D.   On: 03/21/2023 21:15   US Venous Img Lower Bilateral (DVT)  Result Date: 02/25/2023 CLINICAL DATA:  Lower extremity pain and swelling EXAM: BILATERAL LOWER EXTREMITY VENOUS DOPPLER ULTRASOUND TECHNIQUE: Gray-scale sonography with graded compression, as well as color Doppler and duplex ultrasound were performed to evaluate the lower extremity deep venous systems from the level of the common femoral vein and including the common femoral, femoral, profunda femoral, popliteal and calf veins including the posterior tibial, peroneal and gastrocnemius veins when visible. The superficial great saphenous vein was also interrogated. Spectral Doppler was utilized to evaluate flow at rest and with distal augmentation maneuvers in the common femoral, femoral and popliteal veins. COMPARISON:  None Available. FINDINGS: RIGHT LOWER EXTREMITY Common Femoral Vein: No evidence of thrombus. Normal compressibility, respiratory phasicity and response to augmentation. Saphenofemoral Junction: No evidence of thrombus. Normal compressibility and flow on color Doppler imaging. Profunda  Femoral Vein: No evidence of thrombus. Normal compressibility and flow on color Doppler imaging. Femoral Vein: No evidence of thrombus. Normal compressibility, respiratory phasicity and response to augmentation. Popliteal Vein: No evidence of thrombus. Normal compressibility, respiratory phasicity and response to augmentation. Calf Veins: No evidence of thrombus. Normal compressibility and flow on color Doppler imaging. Superficial  Great Saphenous Vein: No evidence of thrombus. Normal compressibility. Venous Reflux:  None. Other Findings:  None. LEFT LOWER EXTREMITY Common Femoral Vein: No evidence of thrombus. Normal compressibility, respiratory phasicity and response to augmentation. Saphenofemoral Junction: No evidence of thrombus. Normal compressibility and flow on color Doppler imaging. Profunda Femoral Vein: No evidence of thrombus. Normal compressibility and flow on color Doppler imaging. Femoral Vein: No evidence of thrombus. Normal compressibility, respiratory phasicity and response to augmentation. Popliteal Vein: No evidence of thrombus. Normal compressibility, respiratory phasicity and response to augmentation. Calf Veins: No evidence of thrombus. Normal compressibility and flow on color Doppler imaging. Superficial Great Saphenous Vein: No evidence of thrombus. Normal compressibility. Venous Reflux:  None. Other Findings:  None. IMPRESSION: No evidence of deep venous thrombosis in either lower extremity. Electronically Signed   By: Alcide Clever M.D.   On: 02/25/2023 21:00    Microbiology: Recent Results (from the past 240 hour(s))  SARS Coronavirus 2 by RT PCR (hospital order, performed in [facility] hospital lab) *cepheid single result test* Urine, Clean Catch     Status: Abnormal   Collection Time: 03/22/23  1:52 AM   Specimen: Urine, Clean Catch; Nasal Swab  Result Value Ref Range Status   SARS Coronavirus 2 by RT PCR POSITIVE (A) NEGATIVE Final    Comment: (NOTE) SARS-CoV-2 target nucleic acids are DETECTED  SARS-CoV-2 RNA is generally detectable in upper respiratory specimens  during the acute phase of infection.  Positive results are indicative  of the presence of the identified virus, but do not rule out bacterial infection or co-infection with other pathogens not detected by the test.  Clinical correlation with patient history and  other diagnostic information is necessary to determine patient infection  status.  The expected result is negative.  Fact Sheet for Patients:   RoadLapTop.co.za   Fact Sheet for Healthcare Providers:   http://kim-miller.com/    This test is not yet approved or cleared by the [place] FDA and  has been authorized for detection and/or diagnosis of SARS-CoV-2 by FDA under an Emergency Use Authorization (EUA).  This EUA will remain in effect (meaning this test can be used) for the duration of  the COVID-19 declaration under Section 564(b)(1)  of the Act, 21 U.S.C. section 360-bbb-3(b)(1), unless the authorization is terminated or revoked sooner.   Performed at [facility], [address] [place], [place] [address]   Blood culture (routine x 2)     Status: None (Preliminary result)   Collection Time: 03/22/23  1:52 AM   Specimen: BLOOD RIGHT ARM  Result Value Ref Range Status   Specimen Description BLOOD RIGHT ARM  Final   Special Requests   Final    BOTTLES DRAWN AEROBIC AND ANAEROBIC Blood Culture adequate volume   Culture   Final    NO GROWTH 2 DAYS Performed at [facility], [address] [place], [place] [address]    Report Status PENDING  Incomplete  Blood culture (routine x 2)     Status: None (Preliminary result)   Collection Time: 03/22/23  1:56 AM   Specimen: BLOOD  Result Value Ref Range Status  Specimen Description BLOOD BLOOD RIGHT HAND  Final   Special Requests   Final    BOTTLES DRAWN AEROBIC AND ANAEROBIC Blood Culture adequate volume   Culture   Final    NO GROWTH 2 DAYS Performed at [facility], [address] [place], [place] [address]    Report Status PENDING  Incomplete     Labs: CBC: Recent Labs  Lab 03/21/23 2035 03/22/23 0435 03/23/23 0605 03/24/23 0432  WBC 5.4 6.1 6.1 5.5  NEUTROABS 3.7  --   --   --   HGB 11.3* 12.1 13.1 12.7  HCT 35.5* 37.9 39.2 36.9  MCV 90.3 90.0 86.2 84.4  PLT 241 234 231 228   Basic Metabolic  Panel: Recent Labs  Lab 03/21/23 2035 03/22/23 0435 03/23/23 0605 03/24/23 0432  NA 138 139 137 138  K 3.3* 3.5 3.1* 3.5  CL 105 105 102 107  CO2 24 23 26 22   GLUCOSE 142* 151* 159* 149*  BUN 17 16 13 15   CREATININE 1.08* 1.06*  1.08* 0.76 0.83  CALCIUM 9.0 9.1 9.2 9.2  MG  --  2.2 1.9 1.9  PHOS  --  3.2 3.2 2.7   Liver Function Tests: Recent Labs  Lab 03/21/23 2035  AST 15  ALT 9  ALKPHOS 60  BILITOT 0.8  PROT 7.1  ALBUMIN 4.1   No results for input(s): "LIPASE", "AMYLASE" in the last 168 hours. No results for input(s): "AMMONIA" in the last 168 hours. Cardiac Enzymes: No results for input(s): "CKTOTAL", "CKMB", "CKMBINDEX", "TROPONINI" in the last 168 hours. BNP (last 3 results) No results for input(s): "BNP" in the last 8760 hours. CBG: Recent Labs  Lab 03/23/23 1132 03/23/23 1534 03/23/23 2124 03/24/23 0826 03/24/23 1127  GLUCAP 161* 149* 117* 121* 115*    Time spent: 35 minutes  Signed:  Gillis Santa  [facility] 03/24/2023 1:16 PM

## 2023-03-25 LAB — CULTURE, BLOOD (ROUTINE X 2): Special Requests: ADEQUATE

## 2023-03-26 LAB — CULTURE, BLOOD (ROUTINE X 2)
Culture: NO GROWTH
Culture: NO GROWTH

## 2023-03-27 LAB — CULTURE, BLOOD (ROUTINE X 2): Special Requests: ADEQUATE

## 2023-04-11 ENCOUNTER — Ambulatory Visit (INDEPENDENT_AMBULATORY_CARE_PROVIDER_SITE_OTHER): Payer: PPO | Admitting: Nurse Practitioner

## 2023-04-11 ENCOUNTER — Encounter (INDEPENDENT_AMBULATORY_CARE_PROVIDER_SITE_OTHER): Payer: Self-pay | Admitting: Nurse Practitioner

## 2023-04-11 VITALS — BP 117/66 | HR 58 | Resp 18 | Ht 62.0 in | Wt 175.2 lb

## 2023-04-11 DIAGNOSIS — M7989 Other specified soft tissue disorders: Secondary | ICD-10-CM

## 2023-04-11 DIAGNOSIS — E119 Type 2 diabetes mellitus without complications: Secondary | ICD-10-CM

## 2023-04-12 NOTE — Progress Notes (Signed)
Subjective:    Patient ID: Anne Parrish, female    DOB: [birth year], 72 y.o.   MRN: [id number] Chief Complaint  Patient presents with   New Patient (Initial Visit)    NP. consult. lymphedema. masoud    Anne Parrish is a 72 year old female who presents today as a referral from Dr. Harl Bowie regarding lower extremity edema.  She notes that the swelling has been ongoing for several months.  Initially there were some changes made to her blood pressure medication which helped the swelling but in turn caused worsening of her blood pressure.  She has since been replaced on the medication and the swelling has worsened.  She currently denies any open wounds or ulcerations.  She has not tried compression consistently.  She sleeps in her recliner the majority of the time and sometimes she elevates but her daughter notes that she is not always consistent with elevation.  She also is not significantly active on a daily basis in terms of active walking but she stays busy around her house.    Review of Systems  Cardiovascular:  Positive for leg swelling.  Skin:  Negative for wound.       Objective:   Physical Exam Vitals reviewed.  HENT:     Head: Normocephalic.  Cardiovascular:     Rate and Rhythm: Normal rate.  Pulmonary:     Effort: Pulmonary effort is normal.  Musculoskeletal:     Right lower leg: Edema present.     Left lower leg: Edema present.  Skin:    General: Skin is warm and dry.  Neurological:     Mental Status: She is alert and oriented to person, place, and time.  Psychiatric:        Behavior: Behavior normal.     BP 117/66 (BP Location: Left Arm)   Pulse (!) 58   Resp 18   Ht 5\' 2"  (1.575 m)   Wt 175 lb 3.2 oz (79.5 kg)   BMI 32.04 kg/m   Past Medical History:  Diagnosis Date   Basal cell carcinoma 07/17/2020   R lat canthus/lower eyelid ED&C    Diabetes mellitus without complication (HCC)    Hyperlipidemia    Hypertension     Social History   Socioeconomic History    Marital status: Divorced    Spouse name: Not on file   Number of children: 2   Years of education: Not on file   Highest education level: Some college, no degree  Occupational History   Occupation: retired   Occupation: owns Manufacturing engineer  Tobacco Use   Smoking status: Never   Smokeless tobacco: Never  Vaping Use   Vaping status: Never Used  Substance and Sexual Activity   Alcohol use: Not Currently   Drug use: No   Sexual activity: Not Currently  Other Topics Concern   Not on file  Social History Narrative   Not on file   Social Determinants of Health   Financial Resource Strain: Low Risk  (09/20/2022)   Overall Financial Resource Strain (CARDIA)    Difficulty of Paying Living Expenses: Not hard at all  Food Insecurity: No Food Insecurity (09/20/2022)   Hunger Vital Sign    Worried About Running Out of Food in the Last Year: Never true    Ran Out of Food in the Last Year: Never true  Transportation Needs: No Transportation Needs (09/20/2022)   PRAPARE - Transportation    Lack of Transportation (Medical): No    Lack of  Transportation (Non-Medical): No  Physical Activity: Inactive (09/20/2022)   Exercise Vital Sign    Days of Exercise per Week: 0 days    Minutes of Exercise per Session: 0 min  Stress: No Stress Concern Present (09/20/2022)   [employer] of Occupational Health - Occupational Stress Questionnaire    Feeling of Stress : Not at all  Social Connections: Moderately Integrated (09/20/2022)   Social Connection and Isolation Panel [NHANES]    Frequency of Communication with Friends and Family: More than three times a week    Frequency of Social Gatherings with Friends and Family: Three times a week    Attends Religious Services: 1 to 4 times per year    Active Member of Clubs or Organizations: No    Attends Banker Meetings: Never    Marital Status: Living with partner  Intimate Partner Violence: Not At Risk (09/20/2022)   Humiliation, Afraid,  Rape, and Kick questionnaire    Fear of Current or Ex-Partner: No    Emotionally Abused: No    Physically Abused: No    Sexually Abused: No    Past Surgical History:  Procedure Laterality Date   APPENDECTOMY  1960's   BREAST BIOPSY Right 02/28/2018    STROMAL FIBROSIS, 5 MM, AND LOBULAR INVOLUTION.   COLONOSCOPY WITH PROPOFOL N/A 04/15/2016   Procedure: COLONOSCOPY WITH PROPOFOL;  Surgeon: Scot Jun, MD;  Location: [facility] ENDOSCOPY;  Service: Endoscopy;  Laterality: N/A;   COLONOSCOPY WITH PROPOFOL N/A 05/27/2020   Procedure: COLONOSCOPY WITH PROPOFOL;  Surgeon: Midge Minium, MD;  Location: [facility] ENDOSCOPY;  Service: Endoscopy;  Laterality: N/A;   REFRACTIVE SURGERY     TONSILLECTOMY AND ADENOIDECTOMY  1975    Family History  Problem Relation Age of Onset   Cancer Mother        lung   Thyroid disease Mother    Cancer Father        prostate   Heart disease Father    Cancer Brother        prostate   Breast cancer Neg Hx     Allergies  Allergen Reactions   Adhesive [Tape]    Morphine And Codeine Itching   Other Other (See Comments)   Zolpidem    Benzonatate Rash   Levofloxacin Rash       Latest Ref Rng & Units 03/24/2023    4:32 AM 03/23/2023    6:05 AM 03/22/2023    4:35 AM  CBC  WBC 4.0 - 10.5 K/uL 5.5  6.1  6.1   Hemoglobin 12.0 - 15.0 g/dL 40.9  81.1  91.4   Hematocrit 36.0 - 46.0 % 36.9  39.2  37.9   Platelets 150 - 400 K/uL 228  231  234       CMP     Component Value Date/Time   NA 138 03/24/2023 0432   NA 135 04/19/2018 1041   K 3.5 03/24/2023 0432   CL 107 03/24/2023 0432   CO2 22 03/24/2023 0432   GLUCOSE 149 (H) 03/24/2023 0432   BUN 15 03/24/2023 0432   BUN 8 04/19/2018 1041   CREATININE 0.83 03/24/2023 0432   CREATININE 0.88 09/20/2022 1141   CALCIUM 9.2 03/24/2023 0432   PROT 7.1 03/21/2023 2035   PROT 6.8 04/19/2018 1041   ALBUMIN 4.1 03/21/2023 2035   ALBUMIN 4.4 04/19/2018 1041   AST 15 03/21/2023 2035   ALT 9 03/21/2023 2035    ALKPHOS 60 03/21/2023 2035   BILITOT 0.8 03/21/2023 2035  BILITOT 0.8 04/19/2018 1041   GFRNONAA >60 03/24/2023 0432   GFRNONAA 60 04/11/2020 1501     No results found.     Assessment & Plan:   1. Leg swelling Recommend:  I have had a long discussion with the patient regarding swelling and why it  causes symptoms.  Patient will begin wearing graduated compression on a daily basis a prescription was given. The patient will  wear the stockings first thing in the morning and removing them in the evening. The patient is instructed specifically not to sleep in the stockings.   In addition, behavioral modification will be initiated.  This will include frequent elevation, use of over the counter pain medications and exercise such as walking.  Consideration for a lymph pump will also be made based upon the effectiveness of conservative therapy.  This would help to improve the edema control and prevent sequela such as ulcers and infections   Patient should undergo duplex ultrasound of the venous system to ensure that DVT or reflux is not present.  The patient will follow-up with me after the ultrasound.    2. Type 2 diabetes mellitus without complication, without long-term current use of insulin (HCC) Continue hypoglycemic medications as already ordered, these medications have been reviewed and there are no changes at this time.  Hgb A1C to be monitored as already arranged by primary service   Current Outpatient Medications on File Prior to Visit  Medication Sig Dispense Refill   acetaminophen (TYLENOL) 650 MG CR tablet Take 650 mg by mouth every 8 (eight) hours as needed for pain.     amLODipine (NORVASC) 10 MG tablet Take 1 tablet (10 mg total) by mouth daily. 30 tablet 5   Bioflavonoid Products (BIOFLEX PO) Take by mouth daily.      clotrimazole-betamethasone (LOTRISONE) cream Apply 1 Application topically daily.     cyanocobalamin 1000 MCG tablet Take 1 tablet (1,000 mcg total) by  mouth daily. 90 tablet 1   glucose blood (ONETOUCH VERIO) test strip To check blood sugar once a day.  DX: E11.9 100 each 3   iron polysaccharides (NIFEREX) 150 MG capsule Take 1 capsule (150 mg total) by mouth daily. 90 capsule 1   lisinopril (ZESTRIL) 30 MG tablet Take 1 tablet (30 mg total) by mouth daily. 30 tablet 5   meloxicam (MOBIC) 7.5 MG tablet Take 7.5 mg by mouth daily.     Multiple Vitamin (MULTIVITAMIN) capsule Take 1 capsule by mouth daily.     ONETOUCH DELICA LANCETS FINE MISC To check blood sugars once a day.  DX:  E11.9 100 each 1   pioglitazone (ACTOS) 15 MG tablet Take 1 tablet (15 mg total) by mouth daily. 90 tablet 1   No current facility-administered medications on file prior to visit.    There are no Patient Instructions on file for this visit. No follow-ups on file.   Georgiana Spinner, NP

## 2023-05-06 ENCOUNTER — Other Ambulatory Visit (INDEPENDENT_AMBULATORY_CARE_PROVIDER_SITE_OTHER): Payer: Self-pay | Admitting: Nurse Practitioner

## 2023-05-06 DIAGNOSIS — M7989 Other specified soft tissue disorders: Secondary | ICD-10-CM

## 2023-05-12 ENCOUNTER — Ambulatory Visit (INDEPENDENT_AMBULATORY_CARE_PROVIDER_SITE_OTHER): Payer: PPO

## 2023-05-12 ENCOUNTER — Ambulatory Visit (INDEPENDENT_AMBULATORY_CARE_PROVIDER_SITE_OTHER): Payer: PPO | Admitting: Nurse Practitioner

## 2023-05-12 ENCOUNTER — Encounter (INDEPENDENT_AMBULATORY_CARE_PROVIDER_SITE_OTHER): Payer: Self-pay | Admitting: Nurse Practitioner

## 2023-05-12 VITALS — BP 159/73 | HR 55 | Resp 18 | Ht 62.0 in | Wt 180.4 lb

## 2023-05-12 DIAGNOSIS — I890 Lymphedema, not elsewhere classified: Secondary | ICD-10-CM | POA: Diagnosis not present

## 2023-05-12 DIAGNOSIS — I872 Venous insufficiency (chronic) (peripheral): Secondary | ICD-10-CM

## 2023-05-12 DIAGNOSIS — M7989 Other specified soft tissue disorders: Secondary | ICD-10-CM | POA: Diagnosis not present

## 2023-05-12 DIAGNOSIS — E119 Type 2 diabetes mellitus without complications: Secondary | ICD-10-CM | POA: Diagnosis not present

## 2023-05-16 NOTE — Progress Notes (Signed)
Subjective:    Patient ID: Anne Parrish, female    DOB: 30-Nov-1950, 72 y.o.   MRN: [id number] Chief Complaint  Patient presents with   Follow-up    pt conv BIL VEN REFLU    Anne Parrish is a 72 year old female who returns today for evaluation of lower extremity edema she notes that the swelling has been ongoing for several months.  Initially there were some changes made to her blood pressure medication which helped the swelling but in turn caused worsening of her blood pressure.  She has since been replaced on the medication and the swelling has worsened.  She currently denies any open wounds or ulcerations.  She has been wearing compression consistently since her last visit and notes that it is helpful with control of her swelling.  She has also been more consistent with elevation.  She also is not significantly active on a daily basis in terms of active walking but she stays busy around her house.    Review of Systems  Cardiovascular:  Positive for leg swelling.  Skin:  Negative for wound.  All other systems reviewed and are negative.      Objective:   Physical Exam Vitals reviewed.  HENT:     Head: Normocephalic.  Cardiovascular:     Rate and Rhythm: Normal rate.     Pulses: Normal pulses.  Pulmonary:     Effort: Pulmonary effort is normal.  Musculoskeletal:     Right lower leg: Edema present.     Left lower leg: Edema present.  Skin:    General: Skin is warm and dry.  Neurological:     Mental Status: She is alert and oriented to person, place, and time.  Psychiatric:        Mood and Affect: Mood normal.        Behavior: Behavior normal.        Thought Content: Thought content normal.        Judgment: Judgment normal.     BP (!) 159/73 (BP Location: Right Arm)   Pulse (!) 55   Resp 18   Ht 5\' 2"  (1.575 m)   Wt 180 lb 6.4 oz (81.8 kg)   BMI 33.00 kg/m   Past Medical History:  Diagnosis Date   Basal cell carcinoma 07/17/2020   R lat canthus/lower eyelid ED&C     Diabetes mellitus without complication (HCC)    Hyperlipidemia    Hypertension     Social History   Socioeconomic History   Marital status: Divorced    Spouse name: Not on file   Number of children: 2   Years of education: Not on file   Highest education level: Some college, no degree  Occupational History   Occupation: retired   Occupation: owns Manufacturing engineer  Tobacco Use   Smoking status: Never   Smokeless tobacco: Never  Vaping Use   Vaping status: Never Used  Substance and Sexual Activity   Alcohol use: Not Currently   Drug use: No   Sexual activity: Not Currently  Other Topics Concern   Not on file  Social History Narrative   Not on file   Social Determinants of Health   Financial Resource Strain: Low Risk  (09/20/2022)   Overall Financial Resource Strain (CARDIA)    Difficulty of Paying Living Expenses: Not hard at all  Food Insecurity: No Food Insecurity (09/20/2022)   Hunger Vital Sign    Worried About Running Out of Food in the Last Year: Never  true    Ran Out of Food in the Last Year: Never true  Transportation Needs: No Transportation Needs (09/20/2022)   PRAPARE - Administrator, Civil Service (Medical): No    Lack of Transportation (Non-Medical): No  Physical Activity: Inactive (09/20/2022)   Exercise Vital Sign    Days of Exercise per Week: 0 days    Minutes of Exercise per Session: 0 min  Stress: No Stress Concern Present (09/20/2022)   [employer] of Occupational Health - Occupational Stress Questionnaire    Feeling of Stress : Not at all  Social Connections: Moderately Integrated (09/20/2022)   Social Connection and Isolation Panel [NHANES]    Frequency of Communication with Friends and Family: More than three times a week    Frequency of Social Gatherings with Friends and Family: Three times a week    Attends Religious Services: 1 to 4 times per year    Active Member of Clubs or Organizations: No    Attends Banker  Meetings: Never    Marital Status: Living with partner  Intimate Partner Violence: Not At Risk (09/20/2022)   Humiliation, Afraid, Rape, and Kick questionnaire    Fear of Current or Ex-Partner: No    Emotionally Abused: No    Physically Abused: No    Sexually Abused: No    Past Surgical History:  Procedure Laterality Date   APPENDECTOMY  1960's   BREAST BIOPSY Right 02/28/2018    STROMAL FIBROSIS, 5 MM, AND LOBULAR INVOLUTION.   COLONOSCOPY WITH PROPOFOL N/A 04/15/2016   Procedure: COLONOSCOPY WITH PROPOFOL;  Surgeon: Scot Jun, MD;  Location: [facility] ENDOSCOPY;  Service: Endoscopy;  Laterality: N/A;   COLONOSCOPY WITH PROPOFOL N/A 05/27/2020   Procedure: COLONOSCOPY WITH PROPOFOL;  Surgeon: Midge Minium, MD;  Location: [facility] ENDOSCOPY;  Service: Endoscopy;  Laterality: N/A;   REFRACTIVE SURGERY     TONSILLECTOMY AND ADENOIDECTOMY  1975    Family History  Problem Relation Age of Onset   Cancer Mother        lung   Thyroid disease Mother    Cancer Father        prostate   Heart disease Father    Cancer Brother        prostate   Breast cancer Neg Hx     Allergies  Allergen Reactions   Adhesive [Tape]    Morphine And Codeine Itching   Other Other (See Comments)   Zolpidem    Benzonatate Rash   Levofloxacin Rash       Latest Ref Rng & Units 03/24/2023    4:32 AM 03/23/2023    6:05 AM 03/22/2023    4:35 AM  CBC  WBC 4.0 - 10.5 K/uL 5.5  6.1  6.1   Hemoglobin 12.0 - 15.0 g/dL 98.1  19.1  47.8   Hematocrit 36.0 - 46.0 % 36.9  39.2  37.9   Platelets 150 - 400 K/uL 228  231  234       CMP     Component Value Date/Time   NA 138 03/24/2023 0432   NA 135 04/19/2018 1041   K 3.5 03/24/2023 0432   CL 107 03/24/2023 0432   CO2 22 03/24/2023 0432   GLUCOSE 149 (H) 03/24/2023 0432   BUN 15 03/24/2023 0432   BUN 8 04/19/2018 1041   CREATININE 0.83 03/24/2023 0432   CREATININE 0.88 09/20/2022 1141   CALCIUM 9.2 03/24/2023 0432   PROT 7.1 03/21/2023 2035   PROT 6.8  04/19/2018 1041   ALBUMIN 4.1 03/21/2023 2035   ALBUMIN 4.4 04/19/2018 1041   AST 15 03/21/2023 2035   ALT 9 03/21/2023 2035   ALKPHOS 60 03/21/2023 2035   BILITOT 0.8 03/21/2023 2035   BILITOT 0.8 04/19/2018 1041   GFRNONAA >60 03/24/2023 0432   GFRNONAA 60 04/11/2020 1501     No results found.     Assessment & Plan:   1. Lymphedema Recommend:  No surgery or intervention at this point in time.   The Patient is CEAP [id number].  The patient has been wearing compression for more than 12 weeks with no or little benefit.  The patient has been exercising daily for more than 12 weeks. The patient has been elevating and taking OTC pain medications for more than 12 weeks.  None of these have have eliminated the pain related to the lymphedema or the discomfort regarding excessive swelling and venous congestion.    I have reviewed my discussion with the patient regarding lymphedema and why it  causes symptoms.  Patient will continue wearing graduated compression on a daily basis. The patient should put the compression on first thing in the morning and removing them in the evening. The patient should not sleep in the compression.   In addition, behavioral modification throughout the day will be continued.  This will include frequent elevation (such as in a recliner), use of over the counter pain medications as needed and exercise such as walking.  The systemic causes for chronic edema such as liver, kidney and cardiac etiologies do not appear to have significant changed over the past year.    The patient has chronic , severe lymphedema with hyperpigmentation of the skin and has done MLD, skin care, medication, diet, exercise, elevation and compression for 4 weeks with no improvement,  I am recommending a lymphedema pump.  The patient still has stage 3 lymphedema and therefore, I believe that a lymph pump is needed to improve the control of the patient's lymphedema and improve the quality of  life.  Additionally, a lymph pump is warranted because it will reduce the risk of cellulitis and ulceration in the future.  Patient should follow-up in six months   2. Type 2 diabetes mellitus without complication, without long-term current use of insulin (HCC) Continue hypoglycemic medications as already ordered, these medications have been reviewed and there are no changes at this time.  Hgb A1C to be monitored as already arranged by primary service  3. Venous (peripheral) insufficiency The patient does have a small amount of venous insufficiency in her right lower extremity.  Given that she has significant swelling bilaterally with the treatment of the swelling venous reflux would likely not result in a drastic difference in her swelling.  I had extensive discussion with patient and her daughter and that the overall underlying cause of her swelling is related to the lymphedema side effects of both of her legs from her equally.  She is advised to continue with follow-up with conservative therapies as noted above.     Current Outpatient Medications on File Prior to Visit  Medication Sig Dispense Refill   acetaminophen (TYLENOL) 650 MG CR tablet Take 650 mg by mouth every 8 (eight) hours as needed for pain.     amLODipine (NORVASC) 10 MG tablet Take 1 tablet (10 mg total) by mouth daily. 30 tablet 5   Bioflavonoid Products (BIOFLEX PO) Take by mouth daily.      clotrimazole-betamethasone (LOTRISONE) cream Apply 1 Application topically daily.  cyanocobalamin 1000 MCG tablet Take 1 tablet (1,000 mcg total) by mouth daily. 90 tablet 1   glucose blood (ONETOUCH VERIO) test strip To check blood sugar once a day.  DX: E11.9 100 each 3   iron polysaccharides (NIFEREX) 150 MG capsule Take 1 capsule (150 mg total) by mouth daily. 90 capsule 1   lisinopril (ZESTRIL) 30 MG tablet Take 1 tablet (30 mg total) by mouth daily. 30 tablet 5   meloxicam (MOBIC) 7.5 MG tablet Take 7.5 mg by mouth daily.      Multiple Vitamin (MULTIVITAMIN) capsule Take 1 capsule by mouth daily.     ONETOUCH DELICA LANCETS FINE MISC To check blood sugars once a day.  DX:  E11.9 100 each 1   pioglitazone (ACTOS) 15 MG tablet Take 1 tablet (15 mg total) by mouth daily. 90 tablet 1   No current facility-administered medications on file prior to visit.    There are no Patient Instructions on file for this visit. No follow-ups on file.   Georgiana Spinner, NP

## 2023-05-17 ENCOUNTER — Telehealth (INDEPENDENT_AMBULATORY_CARE_PROVIDER_SITE_OTHER): Payer: Self-pay

## 2023-05-17 NOTE — Telephone Encounter (Signed)
This was faxed in from Sherlyn Lick at [employer]:  Anne Parrish, I wanted to give you a heads up on this patient. She received her pump in March. In May she called wanting to return it, but policy is equipment must be returned within 30 days. She claims she's been calling me for months, but I only received one voicemail from her 3-4weeks ago. I spoke with her again today and asked her to call the [employer] billing department and see what they can do, as I have no authority to tell anyone to break the policy for her. Rather than listening she always wants to yell and argue with me.  I'm hoping she finally calls them, but in case she calls you to complain, I wanted you to be aware.   Kristie Cowman Sr. Media planner [employer]

## 2023-11-13 DIAGNOSIS — I10 Essential (primary) hypertension: Secondary | ICD-10-CM | POA: Insufficient documentation

## 2023-11-13 DIAGNOSIS — E1159 Type 2 diabetes mellitus with other circulatory complications: Secondary | ICD-10-CM | POA: Insufficient documentation

## 2023-11-13 DIAGNOSIS — I890 Lymphedema, not elsewhere classified: Secondary | ICD-10-CM | POA: Insufficient documentation

## 2023-11-13 NOTE — Progress Notes (Unsigned)
 MRN : [id number]  Anne Parrish is a 73 y.o. (1950/11/07) female who presents with chief complaint of legs swell.  History of Present Illness:   The patient returns to the office for followup evaluation regarding leg swelling.  The swelling has persisted but with the lymph pump is under much, much better controlled. The pain associated with swelling is decreased. There have not been any interval development of a ulcerations or wounds.  No major changes in her health care.  She has not had any episodes of cellulitis or infection in the past 6 months.  The patient denies problems with the pump, noting it is working well and the leggings are in good condition.  She is using it almost every single day in the afternoon.  Since the previous visit the patient has been wearing graduated compression stockings and using the lymph pump on a routine basis and  has noted significant improvement in the lymphedema.   Patient stated the lymph pump has been helpful with the treatment of the lymphedema.   No outpatient medications have been marked as taking for the 11/17/23 encounter (Appointment) with Gilda Crease, Latina Craver, MD.    Past Medical History:  Diagnosis Date   Basal cell carcinoma 07/17/2020   R lat canthus/lower eyelid ED&C    Diabetes mellitus without complication (HCC)    Hyperlipidemia    Hypertension     Past Surgical History:  Procedure Laterality Date   APPENDECTOMY  1960's   BREAST BIOPSY Right 02/28/2018    STROMAL FIBROSIS, 5 MM, AND LOBULAR INVOLUTION.   COLONOSCOPY WITH PROPOFOL N/A 04/15/2016   Procedure: COLONOSCOPY WITH PROPOFOL;  Surgeon: Scot Jun, MD;  Location: [facility] ENDOSCOPY;  Service: Endoscopy;  Laterality: N/A;   COLONOSCOPY WITH PROPOFOL N/A 05/27/2020   Procedure: COLONOSCOPY WITH PROPOFOL;  Surgeon: Midge Minium, MD;  Location: [facility] ENDOSCOPY;  Service: Endoscopy;  Laterality: N/A;   REFRACTIVE SURGERY     TONSILLECTOMY AND  ADENOIDECTOMY  1975    Social History Social History   Tobacco Use   Smoking status: Never   Smokeless tobacco: Never  Vaping Use   Vaping status: Never Used  Substance Use Topics   Alcohol use: Not Currently   Drug use: No    Family History Family History  Problem Relation Age of Onset   Cancer Mother        lung   Thyroid disease Mother    Cancer Father        prostate   Heart disease Father    Cancer Brother        prostate   Breast cancer Neg Hx     Allergies  Allergen Reactions   Adhesive [Tape]    Morphine And Codeine Itching   Other Other (See Comments)   Zolpidem    Benzonatate Rash   Levofloxacin Rash     REVIEW OF SYSTEMS (Negative unless checked)  Constitutional: [] Weight loss  [] Fever  [] Chills Cardiac: [] Chest pain   [] Chest pressure   [] Palpitations   [] Shortness of breath when laying flat   [] Shortness of breath with exertion. Vascular:  [] Pain in legs with walking   [x] Pain in legs with standing  [] History of DVT   [] Phlebitis   [x] Swelling in legs   [] Varicose veins   [] Non-healing ulcers Pulmonary:   [] Uses home oxygen   [] Productive cough   [] Hemoptysis   [] Wheeze  [] COPD   [] Asthma Neurologic:  [] Dizziness   [] Seizures   [] History of stroke   []   History of TIA  [] Aphasia   [] Vissual changes   [] Weakness or numbness in arm   [] Weakness or numbness in leg Musculoskeletal:   [] Joint swelling   [] Joint pain   [] Low back pain Hematologic:  [] Easy bruising  [] Easy bleeding   [] Hypercoagulable state   [] Anemic Gastrointestinal:  [] Diarrhea   [] Vomiting  [] Gastroesophageal reflux/heartburn   [] Difficulty swallowing. Genitourinary:  [] Chronic kidney disease   [] Difficult urination  [] Frequent urination   [] Blood in urine Skin:  [] Rashes   [] Ulcers  Psychological:  [] History of anxiety   []  History of major depression.  Physical Examination  There were no vitals filed for this visit. There is no height or weight on file to calculate BMI. Gen: WD/WN,  NAD Head: [place]/AT, No temporalis wasting.  Ear/Nose/Throat: Hearing grossly intact, nares w/o erythema or drainage, pinna without lesions Eyes: PER, EOMI, sclera nonicteric.  Neck: Supple, no gross masses.  No JVD.  Pulmonary:  Good air movement, no audible wheezing, no use of accessory muscles.  Cardiac: RRR, precordium not hyperdynamic. Vascular:  scattered varicosities present bilaterally.  Mild venous stasis changes to the legs bilaterally.  3+ firm pitting edema, CEAP [id number]  Vessel Right Left  Radial Palpable Palpable  Gastrointestinal: soft, non-distended. No guarding/no peritoneal signs.  Musculoskeletal: M/S 5/5 throughout.  No deformity.  Neurologic: CN 2-12 intact. Pain and light touch intact in extremities.  Symmetrical.  Speech is fluent. Motor exam as listed above. Psychiatric: Judgment intact, Mood & affect appropriate for pt's clinical situation. Dermatologic: Venous rashes no ulcers noted.  No changes consistent with cellulitis. Lymph : No lichenification or skin changes of chronic lymphedema.  CBC Lab Results  Component Value Date   WBC 5.5 03/24/2023   HGB 12.7 03/24/2023   HCT 36.9 03/24/2023   MCV 84.4 03/24/2023   PLT 228 03/24/2023    BMET    Component Value Date/Time   NA 138 03/24/2023 0432   NA 135 04/19/2018 1041   K 3.5 03/24/2023 0432   CL 107 03/24/2023 0432   CO2 22 03/24/2023 0432   GLUCOSE 149 (H) 03/24/2023 0432   BUN 15 03/24/2023 0432   BUN 8 04/19/2018 1041   CREATININE 0.83 03/24/2023 0432   CREATININE 0.88 09/20/2022 1141   CALCIUM 9.2 03/24/2023 0432   GFRNONAA >60 03/24/2023 0432   GFRNONAA 60 04/11/2020 1501   GFRAA 69 04/11/2020 1501   CrCl cannot be calculated (Patient's most recent lab result is older than the maximum 21 days allowed.).  COAG No results found for: "INR", "PROTIME"  Radiology No results found.   Assessment/Plan 1. Lymphedema (Primary) Recommend:  No surgery or intervention at this point in time.     I have reviewed my discussion with the patient regarding lymphedema and why it  causes symptoms.  Patient will continue wearing graduated compression on a daily basis. The patient should put the compression on first thing in the morning and removing them in the evening. The patient should not sleep in the compression.   In addition, behavioral modification throughout the day will be continued.  This will include frequent elevation (such as in a recliner), use of over the counter pain medications as needed and exercise such as walking.  The systemic causes for chronic edema such as liver, kidney and cardiac etiologies does not appear to have significant changed over the past year.    The patient will continue aggressive use of the  lymph pump.  This will continue to improve the edema control  and prevent sequela such as ulcers and infections.   The patient will follow-up with me on an annual basis.   2. Hypercholesteremia Continue statin as ordered and reviewed, no changes at this time  3. Type 2 diabetes mellitus without complication, without long-term current use of insulin (HCC) Continue hypoglycemic medications as already ordered, these medications have been reviewed and there are no changes at this time.  Hgb A1C to be monitored as already arranged by primary service  4. Essential hypertension Continue antihypertensive medications as already ordered, these medications have been reviewed and there are no changes at this time.    Levora Dredge, MD  11/13/2023 5:05 PM

## 2023-11-17 ENCOUNTER — Encounter (INDEPENDENT_AMBULATORY_CARE_PROVIDER_SITE_OTHER): Payer: Self-pay | Admitting: Vascular Surgery

## 2023-11-17 ENCOUNTER — Ambulatory Visit (INDEPENDENT_AMBULATORY_CARE_PROVIDER_SITE_OTHER): Payer: PPO | Admitting: Vascular Surgery

## 2023-11-17 VITALS — BP 130/67 | HR 62 | Resp 18 | Ht 61.0 in | Wt 172.8 lb

## 2023-11-17 DIAGNOSIS — E7800 Pure hypercholesterolemia, unspecified: Secondary | ICD-10-CM | POA: Diagnosis not present

## 2023-11-17 DIAGNOSIS — I890 Lymphedema, not elsewhere classified: Secondary | ICD-10-CM | POA: Diagnosis not present

## 2023-11-17 DIAGNOSIS — I10 Essential (primary) hypertension: Secondary | ICD-10-CM

## 2023-11-17 DIAGNOSIS — E119 Type 2 diabetes mellitus without complications: Secondary | ICD-10-CM | POA: Diagnosis not present

## 2023-11-30 ENCOUNTER — Ambulatory Visit (INDEPENDENT_AMBULATORY_CARE_PROVIDER_SITE_OTHER): Admitting: Nurse Practitioner

## 2023-11-30 ENCOUNTER — Telehealth: Payer: Self-pay | Admitting: Nurse Practitioner

## 2023-11-30 ENCOUNTER — Encounter: Payer: Self-pay | Admitting: Nurse Practitioner

## 2023-11-30 VITALS — BP 138/69 | HR 60 | Temp 97.8°F | Resp 16 | Ht 61.0 in | Wt 180.2 lb

## 2023-11-30 DIAGNOSIS — G3184 Mild cognitive impairment, so stated: Secondary | ICD-10-CM

## 2023-11-30 DIAGNOSIS — E1169 Type 2 diabetes mellitus with other specified complication: Secondary | ICD-10-CM | POA: Diagnosis not present

## 2023-11-30 DIAGNOSIS — Z1231 Encounter for screening mammogram for malignant neoplasm of breast: Secondary | ICD-10-CM

## 2023-11-30 DIAGNOSIS — I152 Hypertension secondary to endocrine disorders: Secondary | ICD-10-CM

## 2023-11-30 DIAGNOSIS — Z79899 Other long term (current) drug therapy: Secondary | ICD-10-CM

## 2023-11-30 DIAGNOSIS — E1159 Type 2 diabetes mellitus with other circulatory complications: Secondary | ICD-10-CM

## 2023-11-30 DIAGNOSIS — I10 Essential (primary) hypertension: Secondary | ICD-10-CM

## 2023-11-30 DIAGNOSIS — F419 Anxiety disorder, unspecified: Secondary | ICD-10-CM

## 2023-11-30 DIAGNOSIS — E119 Type 2 diabetes mellitus without complications: Secondary | ICD-10-CM

## 2023-11-30 LAB — POCT GLYCOSYLATED HEMOGLOBIN (HGB A1C): Hemoglobin A1C: 7.3 % — AB (ref 4.0–5.6)

## 2023-11-30 MED ORDER — DONEPEZIL HCL 10 MG PO TABS
10.0000 mg | ORAL_TABLET | Freq: Every day | ORAL | 1 refills | Status: DC
Start: 2023-11-30 — End: 2024-04-19

## 2023-11-30 MED ORDER — ALPRAZOLAM 0.25 MG PO TABS
ORAL_TABLET | ORAL | 1 refills | Status: DC
Start: 2023-11-30 — End: 2024-01-26

## 2023-11-30 MED ORDER — ONETOUCH DELICA LANCETS 33G MISC
11 refills | Status: AC
Start: 2023-11-30 — End: ?

## 2023-11-30 MED ORDER — GLIMEPIRIDE 2 MG PO TABS
2.0000 mg | ORAL_TABLET | Freq: Every day | ORAL | 1 refills | Status: DC
Start: 2023-11-30 — End: 2024-04-19

## 2023-11-30 MED ORDER — AMLODIPINE BESYLATE 10 MG PO TABS
10.0000 mg | ORAL_TABLET | Freq: Every day | ORAL | 1 refills | Status: DC
Start: 2023-11-30 — End: 2024-04-19

## 2023-11-30 MED ORDER — ONETOUCH VERIO VI STRP
ORAL_STRIP | 11 refills | Status: AC
Start: 2023-11-30 — End: ?

## 2023-11-30 MED ORDER — POLYSACCHARIDE IRON COMPLEX 150 MG PO CAPS: 150.0000 mg | ORAL_CAPSULE | Freq: Every day | ORAL | 1 refills | Status: DC

## 2023-11-30 MED ORDER — LISINOPRIL 30 MG PO TABS: 30.0000 mg | ORAL_TABLET | Freq: Every day | ORAL | 5 refills | Status: DC

## 2023-11-30 MED ORDER — PIOGLITAZONE HCL 30 MG PO TABS
30.0000 mg | ORAL_TABLET | Freq: Every day | ORAL | 1 refills | Status: DC
Start: 2023-11-30 — End: 2024-04-19

## 2023-11-30 NOTE — Progress Notes (Signed)
 [facility] [address] [place], [place] [address]  Internal MEDICINE  Office Visit Note  Patient Name: Anne Parrish  [id number]  [id number]  Date of Service: 11/30/2023   Complaints/HPI Pt is here for establishment of PCP. Chief Complaint  Patient presents with   New Patient (Initial Visit)    Medicine refill    HPI Anayla presents for a new patient visit to establish care.  Well-appearing 73 y.o. female with dementia, diabetes, hypertension, high cholesterol, and osteopenia Work: not working/retired.  Home: lives at home with family  Diet: fairly healthy Exercise: does some mild work in the yard sometimes.  Tobacco use: none  Alcohol use: none  Illicit drug use: none  Routine CRC screening: due next year  Routine mammogram: due now  DEXA scan: done 5 years ago, showed osteopenia.  Eye exam: has a regular eye doctor.   foot exam: will be done at yearly visit.  Labs: some labs done recently, cholesterol panel due later this year.  New or worsening pain: none  Last A1c was 7.0 in June 2024, taking glimepiride , A1c is 7.3 today.  Sundowners -- was recently started on xanax  which is helping some.  Sees vascular surgery Sees dermatology -- TBSE with Lawyer Pride eye doctor -- [facility].  Dr. Ole Berkeley did her colonoscopy in 2021   Current Medication: Outpatient Encounter Medications as of 11/30/2023  Medication Sig   acetaminophen  (TYLENOL ) 650 MG CR tablet Take 650 mg by mouth every 8 (eight) hours as needed for pain.   Bioflavonoid Products (BIOFLEX PO) Take by mouth daily.   clotrimazole-betamethasone (LOTRISONE) cream Apply 1 Application topically daily.   donepezil  (ARICEPT ) 10 MG tablet Take 1 tablet (10 mg total) by mouth at bedtime.   meloxicam (MOBIC) 7.5 MG tablet Take 7.5 mg by mouth daily.   Multiple Vitamin (MULTIVITAMIN) capsule Take 1 capsule by mouth daily.   OneTouch Delica Lancets 33G MISC Use 1 lancet to check glucose once daily  as needed for diabetes E11.65   pioglitazone  (ACTOS ) 30 MG tablet Take 1 tablet (30 mg total) by mouth daily.   [DISCONTINUED] ALPRAZolam  (XANAX ) 0.25 MG tablet Take 0.25 mg by mouth daily.   [DISCONTINUED] amLODipine  (NORVASC ) 10 MG tablet Take 1 tablet (10 mg total) by mouth daily.   [DISCONTINUED] donepezil  (ARICEPT ) 5 MG tablet Take 5 mg by mouth daily.   [DISCONTINUED] glimepiride  (AMARYL ) 2 MG tablet Take 2 mg by mouth daily.   [DISCONTINUED] glucose blood (ONETOUCH VERIO) test strip To check blood sugar once a day.  DX: E11.9   [DISCONTINUED] iron  polysaccharides (NIFEREX) 150 MG capsule Take 1 capsule (150 mg total) by mouth daily.   [DISCONTINUED] lisinopril  (ZESTRIL ) 30 MG tablet Take 1 tablet (30 mg total) by mouth daily.   [DISCONTINUED] ONETOUCH DELICA LANCETS FINE MISC To check blood sugars once a day.  DX:  E11.9   [DISCONTINUED] pioglitazone  (ACTOS ) 15 MG tablet Take 1 tablet (15 mg total) by mouth daily.   ALPRAZolam  (XANAX ) 0.25 MG tablet Take 1 tablet by mouth at noon and 1 tablet with supper daily.   amLODipine  (NORVASC ) 10 MG tablet Take 1 tablet (10 mg total) by mouth daily.   glimepiride  (AMARYL ) 2 MG tablet Take 1 tablet (2 mg total) by mouth daily. With breakfast   glucose blood (ONETOUCH VERIO) test strip Use 1 test strip to check glucose one daily as needed for diabetes E11.65   iron  polysaccharides (NIFEREX) 150 MG capsule Take 1 capsule (150  mg total) by mouth daily.   lisinopril  (ZESTRIL ) 30 MG tablet Take 1 tablet (30 mg total) by mouth daily.   No facility-administered encounter medications on file as of 11/30/2023.    Surgical History: Past Surgical History:  Procedure Laterality Date   APPENDECTOMY  1960's   BREAST BIOPSY Right 02/28/2018    STROMAL FIBROSIS, 5 MM, AND LOBULAR INVOLUTION.   COLONOSCOPY WITH PROPOFOL  N/A 04/15/2016   Procedure: COLONOSCOPY WITH PROPOFOL ;  Surgeon: Cassie Click, MD;  Location: [facility] ENDOSCOPY;  Service: Endoscopy;   Laterality: N/A;   COLONOSCOPY WITH PROPOFOL  N/A 05/27/2020   Procedure: COLONOSCOPY WITH PROPOFOL ;  Surgeon: Marnee Sink, MD;  Location: [facility] ENDOSCOPY;  Service: Endoscopy;  Laterality: N/A;   REFRACTIVE SURGERY     TONSILLECTOMY AND ADENOIDECTOMY  1975    Medical History: Past Medical History:  Diagnosis Date   Basal cell carcinoma 07/17/2020   R lat canthus/lower eyelid ED&C    Diabetes mellitus without complication (HCC)    Hyperlipidemia    Hypertension     Family History: Family History  Problem Relation Age of Onset   Cancer Mother        lung   Thyroid  disease Mother    Cancer Father        prostate   Heart disease Father    Cancer Brother        prostate   Breast cancer Neg Hx     Social History   Socioeconomic History   Marital status: Divorced    Spouse name: Not on file   Number of children: 2   Years of education: Not on file   Highest education level: Some college, no degree  Occupational History   Occupation: retired   Occupation: owns Manufacturing engineer  Tobacco Use   Smoking status: Never   Smokeless tobacco: Never  Vaping Use   Vaping status: Never Used  Substance and Sexual Activity   Alcohol use: Not Currently   Drug use: No   Sexual activity: Not Currently  Other Topics Concern   Not on file  Social History Narrative   Not on file   Social Drivers of Health   Financial Resource Strain: Low Risk  (09/20/2022)   Overall Financial Resource Strain (CARDIA)    Difficulty of Paying Living Expenses: Not hard at all  Food Insecurity: No Food Insecurity (09/20/2022)   Hunger Vital Sign    Worried About Running Out of Food in the Last Year: Never true    Ran Out of Food in the Last Year: Never true  Transportation Needs: No Transportation Needs (09/20/2022)   PRAPARE - Administrator, Civil Service (Medical): No    Lack of Transportation (Non-Medical): No  Physical Activity: Inactive (09/20/2022)   Exercise Vital Sign    Days of  Exercise per Week: 0 days    Minutes of Exercise per Session: 0 min  Stress: No Stress Concern Present (09/20/2022)   [employer] of Occupational Health - Occupational Stress Questionnaire    Feeling of Stress : Not at all  Social Connections: Moderately Integrated (09/20/2022)   Social Connection and Isolation Panel [NHANES]    Frequency of Communication with Friends and Family: More than three times a week    Frequency of Social Gatherings with Friends and Family: Three times a week    Attends Religious Services: 1 to 4 times per year    Active Member of Clubs or Organizations: No    Attends Banker  Meetings: Never    Marital Status: Living with partner  Intimate Partner Violence: Not At Risk (09/20/2022)   Humiliation, Afraid, Rape, and Kick questionnaire    Fear of Current or Ex-Partner: No    Emotionally Abused: No    Physically Abused: No    Sexually Abused: No     Review of Systems  Constitutional:  Positive for fatigue. Negative for chills and unexpected weight change.  HENT:  Negative for congestion, postnasal drip, rhinorrhea, sneezing and sore throat.   Eyes:  Negative for redness.  Respiratory:  Negative for cough, chest tightness and shortness of breath.   Cardiovascular: Negative.  Negative for chest pain and palpitations.  Gastrointestinal:  Negative for abdominal pain, constipation, diarrhea, nausea and vomiting.  Genitourinary:  Negative for dysuria and frequency.  Musculoskeletal:  Negative for arthralgias, back pain, joint swelling and neck pain.  Skin:  Negative for rash.  Neurological: Negative.  Negative for tremors and numbness.  Hematological:  Negative for adenopathy. Does not bruise/bleed easily.  Psychiatric/Behavioral:  Positive for confusion. Negative for behavioral problems (Depression), sleep disturbance and suicidal ideas. The patient is not nervous/anxious.     Vital Signs: BP 138/69   Pulse 60   Temp 97.8 F (36.6 C)   Resp 16    Ht 5\' 1"  (1.549 m)   Wt 180 lb 3.2 oz (81.7 kg)   SpO2 99%   BMI 34.05 kg/m    Physical Exam Vitals reviewed.  Constitutional:      Appearance: Normal appearance. She is obese. She is not ill-appearing.  HENT:     Head: Normocephalic and atraumatic.  Eyes:     Pupils: Pupils are equal, round, and reactive to light.  Cardiovascular:     Rate and Rhythm: Normal rate and regular rhythm.  Pulmonary:     Effort: Pulmonary effort is normal. No respiratory distress.  Musculoskeletal:     Cervical back: Normal range of motion.  Skin:    General: Skin is warm and dry.     Capillary Refill: Capillary refill takes less than 2 seconds.  Neurological:     Mental Status: She is alert and oriented to person, place, and time.  Psychiatric:        Mood and Affect: Mood normal.        Behavior: Behavior normal.       Assessment/Plan: 1. Type 2 diabetes mellitus with other specified complication, without long-term current use of insulin  (HCC) (Primary) A1c is slightly elevated at 7.3. continue glimpiride and actos  as prescribed.  - POCT glycosylated hemoglobin (Hb A1C) - glimepiride  (AMARYL ) 2 MG tablet; Take 1 tablet (2 mg total) by mouth daily. With breakfast  Dispense: 90 tablet; Refill: 1 - glucose blood (ONETOUCH VERIO) test strip; Use 1 test strip to check glucose one daily as needed for diabetes E11.65  Dispense: 100 each; Refill: 11 - OneTouch Delica Lancets 33G MISC; Use 1 lancet to check glucose once daily as needed for diabetes E11.65  Dispense: 100 each; Refill: 11 - pioglitazone  (ACTOS ) 30 MG tablet; Take 1 tablet (30 mg total) by mouth daily.  Dispense: 90 tablet; Refill: 1  2. Hypertension associated with type 2 diabetes mellitus (HCC) Stable, continue amlodipine  and lisinopril  as prescribed  - amLODipine  (NORVASC ) 10 MG tablet; Take 1 tablet (10 mg total) by mouth daily.  Dispense: 90 tablet; Refill: 1 - lisinopril  (ZESTRIL ) 30 MG tablet; Take 1 tablet (30 mg total) by  mouth daily.  Dispense: 30 tablet; Refill: 5  3. Mild cognitive impairment Continue donepezil  as prescribed  - donepezil  (ARICEPT ) 10 MG tablet; Take 1 tablet (10 mg total) by mouth at bedtime.  Dispense: 90 tablet; Refill: 1  4. Encounter for screening mammogram for malignant neoplasm of breast Routine mammogram ordered . - MM 3D SCREENING MAMMOGRAM BILATERAL BREAST; Future  5. Encounter for medication review Medication list reviewed, updated and refills ordered  - iron  polysaccharides (NIFEREX) 150 MG capsule; Take 1 capsule (150 mg total) by mouth daily.  Dispense: 90 capsule; Refill: 1  6. Anxiety Continue alprazolam  as prescribed as needed.  - ALPRAZolam  (XANAX ) 0.25 MG tablet; Take 1 tablet by mouth at noon and 1 tablet with supper daily.  Dispense: 60 tablet; Refill: 1    General Counseling: Marialice verbalizes understanding of the findings of todays visit and agrees with plan of treatment. I have discussed any further diagnostic evaluation that may be needed or ordered today. We also reviewed her medications today. she has been encouraged to call the office with any questions or concerns that should arise related to todays visit.    Orders Placed This Encounter  Procedures   MM 3D SCREENING MAMMOGRAM BILATERAL BREAST   POCT glycosylated hemoglobin (Hb A1C)    Meds ordered this encounter  Medications   ALPRAZolam  (XANAX ) 0.25 MG tablet    Sig: Take 1 tablet by mouth at noon and 1 tablet with supper daily.    Dispense:  60 tablet    Refill:  1    Fill new script today   donepezil  (ARICEPT ) 10 MG tablet    Sig: Take 1 tablet (10 mg total) by mouth at bedtime.    Dispense:  90 tablet    Refill:  1    Note increased dose, discontinue 5 mg dose and fill new script today for 90 day supply.   amLODipine  (NORVASC ) 10 MG tablet    Sig: Take 1 tablet (10 mg total) by mouth daily.    Dispense:  90 tablet    Refill:  1    Patient request 90 day supply   glimepiride  (AMARYL ) 2  MG tablet    Sig: Take 1 tablet (2 mg total) by mouth daily. With breakfast    Dispense:  90 tablet    Refill:  1    For future refills, 90 day supply please   glucose blood (ONETOUCH VERIO) test strip    Sig: Use 1 test strip to check glucose one daily as needed for diabetes E11.65    Dispense:  100 each    Refill:  11    Dx code E11.65   OneTouch Delica Lancets 33G MISC    Sig: Use 1 lancet to check glucose once daily as needed for diabetes E11.65    Dispense:  100 each    Refill:  11    E11.65   pioglitazone  (ACTOS ) 30 MG tablet    Sig: Take 1 tablet (30 mg total) by mouth daily.    Dispense:  90 tablet    Refill:  1    For future refills, 90 day supply please. Dx code E11.65   iron  polysaccharides (NIFEREX) 150 MG capsule    Sig: Take 1 capsule (150 mg total) by mouth daily.    Dispense:  90 capsule    Refill:  1   lisinopril  (ZESTRIL ) 30 MG tablet    Sig: Take 1 tablet (30 mg total) by mouth daily.    Dispense:  30 tablet    Refill:  5    Return in about 8 weeks (around 01/25/2024) for AWV, Zidane Renner PCP and will evaluate medication changes as well .  Time spent:30 Minutes Time spent with patient included reviewing progress notes, labs, imaging studies, and discussing plan for follow up.   [place] Controlled Substance Database was reviewed by me for overdose risk score (ORS)   This patient was seen by Laurence Pons, FNP-C in collaboration with Dr. Verneta Gone as a part of collaborative care agreement.   Paxson Harrower R. Bobbi Burow, MSN, FNP-C Internal Medicine

## 2023-11-30 NOTE — Telephone Encounter (Signed)
 Notified daughter, Archie Patten,  of mammogram appointment date, arrival time, location-[place]

## 2023-12-19 ENCOUNTER — Ambulatory Visit
Admission: RE | Admit: 2023-12-19 | Discharge: 2023-12-19 | Disposition: A | Discharge status: Home / Self Care | Source: Ambulatory Visit | Attending: Nurse Practitioner | Admitting: Nurse Practitioner

## 2023-12-19 DIAGNOSIS — Z1231 Encounter for screening mammogram for malignant neoplasm of breast: Secondary | ICD-10-CM | POA: Insufficient documentation

## 2024-01-16 ENCOUNTER — Encounter: Payer: Self-pay | Admitting: Nurse Practitioner

## 2024-01-25 ENCOUNTER — Ambulatory Visit: Admitting: Nurse Practitioner

## 2024-01-26 ENCOUNTER — Ambulatory Visit (INDEPENDENT_AMBULATORY_CARE_PROVIDER_SITE_OTHER): Admitting: Nurse Practitioner

## 2024-01-26 ENCOUNTER — Encounter: Payer: Self-pay | Admitting: Nurse Practitioner

## 2024-01-26 VITALS — BP 145/67 | HR 52 | Temp 97.5°F | Resp 16 | Ht 61.0 in | Wt 180.8 lb

## 2024-01-26 DIAGNOSIS — G3184 Mild cognitive impairment, so stated: Secondary | ICD-10-CM | POA: Diagnosis not present

## 2024-01-26 DIAGNOSIS — Z0000 Encounter for general adult medical examination without abnormal findings: Secondary | ICD-10-CM

## 2024-01-26 DIAGNOSIS — F419 Anxiety disorder, unspecified: Secondary | ICD-10-CM | POA: Diagnosis not present

## 2024-01-26 DIAGNOSIS — I152 Hypertension secondary to endocrine disorders: Secondary | ICD-10-CM

## 2024-01-26 DIAGNOSIS — E1159 Type 2 diabetes mellitus with other circulatory complications: Secondary | ICD-10-CM | POA: Diagnosis not present

## 2024-01-26 DIAGNOSIS — E1169 Type 2 diabetes mellitus with other specified complication: Secondary | ICD-10-CM | POA: Diagnosis not present

## 2024-01-26 MED ORDER — ALPRAZOLAM 0.25 MG PO TABS: ORAL_TABLET | ORAL | 2 refills | Status: DC

## 2024-01-26 NOTE — Progress Notes (Signed)
 [facility] [address] [place], [place] [address]  Internal MEDICINE  Office Visit Note  Patient Name: MIAKODA Parrish  [id number]  [id number]  Date of Service: 01/26/2024  Chief Complaint  Patient presents with   Diabetes   Hypertension   Hyperlipidemia   Medicare Wellness    HPI [place]ana presents for a medicare annual wellness visit.  Well-appearing 73 y.o. female with dementia, diabetes, hypertension, high cholesterol, and osteopenia  Routine CRC screening: done in 2021 Routine mammogram: done in apri lthis year  DEXA scan: done in 2017 Labs: last A1c was 7.3.  New or worsening pain: none  Other concerns: Sundowners -- was recently started on xanax  which is helping some, recent change in dose is helping more.  Sees vascular surgery Sees dermatology -- TBSE with Lawyer Pride eye doctor -- [facility].  Dr. Ole Berkeley did her colonoscopy in 2021     01/26/2024   10:19 AM  MMSE - Mini Mental State Exam  Orientation to time 0  Orientation to Place 0  Registration 0  Attention/ Calculation 0  Recall 0  Language- name 2 objects 2  Language- repeat 0  Language- follow 3 step command 3  Language- read & follow direction 1  Write a sentence 0  Copy design 1  Total score 7    Functional Status Survey: Is the patient deaf or have difficulty hearing?: No Does the patient have difficulty seeing, even when wearing glasses/contacts?: No Does the patient have difficulty concentrating, remembering, or making decisions?: No Does the patient have difficulty walking or climbing stairs?: Yes Does the patient have difficulty dressing or bathing?: No Does the patient have difficulty doing errands alone such as visiting a doctor's office or shopping?: No     04/18/2020   11:03 AM 06/05/2021   11:51 AM 05/25/2022    9:46 AM 11/30/2023    9:31 AM 01/26/2024   10:17 AM  Fall Risk  Falls in the past year? 0 0 0 0 0  Was there an injury with Fall? 0 0 0 0 0  Fall Risk  Category Calculator 0 0 0 0 0  Fall Risk Category (Retired) Low  Low  Low     (RETIRED) Patient Fall Risk Level Low fall risk  Low fall risk  Low fall risk     Patient at Risk for Falls Due to  No Fall Risks No Fall Risks No Fall Risks No Fall Risks  Fall risk Follow up  Falls evaluation completed  Falls evaluation completed  Falls evaluation completed Falls evaluation completed     Data saved with a previous flowsheet row definition       01/26/2024   10:17 AM  Depression screen PHQ 2/9  Decreased Interest 0  Down, Depressed, Hopeless 0  PHQ - 2 Score 0      Current Medication: Outpatient Encounter Medications as of 01/26/2024  Medication Sig   acetaminophen  (TYLENOL ) 650 MG CR tablet Take 650 mg by mouth every 8 (eight) hours as needed for pain.   amLODipine  (NORVASC ) 10 MG tablet Take 1 tablet (10 mg total) by mouth daily.   Bioflavonoid Products (BIOFLEX PO) Take by mouth daily.   clotrimazole-betamethasone (LOTRISONE) cream Apply 1 Application topically daily.   donepezil  (ARICEPT ) 10 MG tablet Take 1 tablet (10 mg total) by mouth at bedtime.   glimepiride  (AMARYL ) 2 MG tablet Take 1 tablet (2 mg total) by mouth daily. With breakfast   glucose blood (ONETOUCH VERIO) test  strip Use 1 test strip to check glucose one daily as needed for diabetes E11.65   iron  polysaccharides (NIFEREX) 150 MG capsule Take 1 capsule (150 mg total) by mouth daily.   lisinopril  (ZESTRIL ) 30 MG tablet Take 1 tablet (30 mg total) by mouth daily.   meloxicam (MOBIC) 7.5 MG tablet Take 7.5 mg by mouth daily.   Multiple Vitamin (MULTIVITAMIN) capsule Take 1 capsule by mouth daily.   OneTouch Delica Lancets 33G MISC Use 1 lancet to check glucose once daily as needed for diabetes E11.65   pioglitazone  (ACTOS ) 30 MG tablet Take 1 tablet (30 mg total) by mouth daily.   [DISCONTINUED] ALPRAZolam  (XANAX ) 0.25 MG tablet Take 1 tablet by mouth at noon and 1 tablet with supper daily.   ALPRAZolam  (XANAX ) 0.25 MG  tablet Take 1 tablet by mouth at noon and 1 tablet with supper daily.   No facility-administered encounter medications on file as of 01/26/2024.    Surgical History: Past Surgical History:  Procedure Laterality Date   APPENDECTOMY  1960's   BREAST BIOPSY Right 02/28/2018    STROMAL FIBROSIS, 5 MM, AND LOBULAR INVOLUTION.   COLONOSCOPY WITH PROPOFOL  N/A 04/15/2016   Procedure: COLONOSCOPY WITH PROPOFOL ;  Surgeon: Cassie Click, MD;  Location: [facility] ENDOSCOPY;  Service: Endoscopy;  Laterality: N/A;   COLONOSCOPY WITH PROPOFOL  N/A 05/27/2020   Procedure: COLONOSCOPY WITH PROPOFOL ;  Surgeon: Marnee Sink, MD;  Location: [facility] ENDOSCOPY;  Service: Endoscopy;  Laterality: N/A;   REFRACTIVE SURGERY     TONSILLECTOMY AND ADENOIDECTOMY  1975    Medical History: Past Medical History:  Diagnosis Date   Basal cell carcinoma 07/17/2020   R lat canthus/lower eyelid ED&C    Diabetes mellitus without complication (HCC)    Hyperlipidemia    Hypertension     Family History: Family History  Problem Relation Age of Onset   Cancer Mother        lung   Thyroid  disease Mother    Cancer Father        prostate   Heart disease Father    Cancer Brother        prostate   Breast cancer Neg Hx     Social History   Socioeconomic History   Marital status: Divorced    Spouse name: Not on file   Number of children: 2   Years of education: Not on file   Highest education level: Some college, no degree  Occupational History   Occupation: retired   Occupation: owns Manufacturing engineer  Tobacco Use   Smoking status: Never   Smokeless tobacco: Never  Vaping Use   Vaping status: Never Used  Substance and Sexual Activity   Alcohol use: Not Currently   Drug use: No   Sexual activity: Not Currently  Other Topics Concern   Not on file  Social History Narrative   Not on file   Social Drivers of Health   Financial Resource Strain: Low Risk  (09/20/2022)   Overall Financial Resource Strain (CARDIA)     Difficulty of Paying Living Expenses: Not hard at all  Food Insecurity: No Food Insecurity (09/20/2022)   Hunger Vital Sign    Worried About Running Out of Food in the Last Year: Never true    Ran Out of Food in the Last Year: Never true  Transportation Needs: No Transportation Needs (09/20/2022)   PRAPARE - Administrator, Civil Service (Medical): No    Lack of Transportation (Non-Medical): No  Physical Activity:  Inactive (09/20/2022)   Exercise Vital Sign    Days of Exercise per Week: 0 days    Minutes of Exercise per Session: 0 min  Stress: No Stress Concern Present (09/20/2022)   [employer] of Occupational Health - Occupational Stress Questionnaire    Feeling of Stress : Not at all  Social Connections: Moderately Integrated (09/20/2022)   Social Connection and Isolation Panel    Frequency of Communication with Friends and Family: More than three times a week    Frequency of Social Gatherings with Friends and Family: Three times a week    Attends Religious Services: 1 to 4 times per year    Active Member of Clubs or Organizations: No    Attends Banker Meetings: Never    Marital Status: Living with partner  Intimate Partner Violence: Not At Risk (09/20/2022)   Humiliation, Afraid, Rape, and Kick questionnaire    Fear of Current or Ex-Partner: No    Emotionally Abused: No    Physically Abused: No    Sexually Abused: No      Review of Systems  Constitutional:  Positive for fatigue. Negative for chills and unexpected weight change.  HENT:  Negative for congestion, postnasal drip, rhinorrhea, sneezing and sore throat.   Eyes:  Negative for redness.  Respiratory:  Negative for cough, chest tightness and shortness of breath.   Cardiovascular: Negative.  Negative for chest pain and palpitations.  Gastrointestinal:  Negative for abdominal pain, constipation, diarrhea, nausea and vomiting.  Genitourinary:  Negative for dysuria and frequency.   Musculoskeletal:  Negative for arthralgias, back pain, joint swelling and neck pain.  Skin:  Negative for rash.  Neurological: Negative.  Negative for tremors and numbness.  Hematological:  Negative for adenopathy. Does not bruise/bleed easily.  Psychiatric/Behavioral:  Positive for confusion. Negative for behavioral problems (Depression), sleep disturbance and suicidal ideas. The patient is not nervous/anxious.     Vital Signs: BP (!) 145/67   Pulse (!) 52   Temp (!) 97.5 F (36.4 C)   Resp 16   Ht 5' 1 (1.549 m)   Wt 180 lb 12.8 oz (82 kg)   SpO2 99%   BMI 34.16 kg/m    Physical Exam Vitals reviewed.  Constitutional:      Appearance: Normal appearance. She is obese. She is not ill-appearing.  HENT:     Head: Normocephalic and atraumatic.   Eyes:     Pupils: Pupils are equal, round, and reactive to light.    Cardiovascular:     Rate and Rhythm: Normal rate and regular rhythm.  Pulmonary:     Effort: Pulmonary effort is normal. No respiratory distress.   Musculoskeletal:     Cervical back: Normal range of motion.   Skin:    General: Skin is warm and dry.     Capillary Refill: Capillary refill takes less than 2 seconds.   Neurological:     Mental Status: She is alert and oriented to person, place, and time.   Psychiatric:        Mood and Affect: Mood normal.        Behavior: Behavior normal.        Assessment/Plan: 1. Encounter for subsequent annual wellness visit (AWV) in Medicare patient (Primary) Age-appropriate preventive screenings and vaccinations discussed, annual physical exam completed. Routine labs for health maintenance deferred for now. PHM updated.    2. Type 2 diabetes mellitus with other specified complication, without long-term current use of insulin  (HCC) Continue glimepiride  and  actos  as prescribed   3. Mild cognitive impairment Continue donepezil  as prescribed.   4. Hypertension associated with type 2 diabetes mellitus  (HCC) Stable, continue lisinopril  as prescribed.   5. Anxiety Continue alprazolam  as prescribed, follow up in 3 months for additional refills.  - ALPRAZolam  (XANAX ) 0.25 MG tablet; Take 1 tablet by mouth at noon and 1 tablet with supper daily.  Dispense: 60 tablet; Refill: 2     General Counseling: Breeona verbalizes understanding of the findings of todays visit and agrees with plan of treatment. I have discussed any further diagnostic evaluation that may be needed or ordered today. We also reviewed her medications today. she has been encouraged to call the office with any questions or concerns that should arise related to todays visit.    No orders of the defined types were placed in this encounter.   Meds ordered this encounter  Medications   ALPRAZolam  (XANAX ) 0.25 MG tablet    Sig: Take 1 tablet by mouth at noon and 1 tablet with supper daily.    Dispense:  60 tablet    Refill:  2    For future refills    Return in about 3 months (around 04/18/2024) for F/U, anxiety med refill, Loreley Schwall PCP.   Total time spent:30 Minutes Time spent includes review of chart, medications, test results, and follow up plan with the patient.   [place] Controlled Substance Database was reviewed by me.  This patient was seen by Laurence Pons, FNP-C in collaboration with Dr. Verneta Gone as a part of collaborative care agreement.  Craig Wisnewski R. Bobbi Burow, MSN, FNP-C Internal medicine

## 2024-01-27 ENCOUNTER — Telehealth: Payer: Self-pay

## 2024-01-27 MED ORDER — CIPROFLOXACIN HCL 500 MG PO TABS: 500.0000 mg | ORAL_TABLET | Freq: Two times a day (BID) | ORAL | 0 refills | Status: AC

## 2024-01-27 NOTE — Telephone Encounter (Signed)
 Patient daughter notified

## 2024-01-31 ENCOUNTER — Encounter (INDEPENDENT_AMBULATORY_CARE_PROVIDER_SITE_OTHER): Payer: Self-pay

## 2024-02-26 ENCOUNTER — Encounter: Payer: Self-pay | Admitting: Nurse Practitioner

## 2024-04-19 ENCOUNTER — Encounter: Payer: Self-pay | Admitting: Nurse Practitioner

## 2024-04-19 ENCOUNTER — Ambulatory Visit: Admitting: Nurse Practitioner

## 2024-04-19 VITALS — BP 127/68 | HR 60 | Temp 96.8°F | Resp 16 | Ht 61.0 in | Wt 183.6 lb

## 2024-04-19 DIAGNOSIS — E1169 Type 2 diabetes mellitus with other specified complication: Secondary | ICD-10-CM | POA: Diagnosis not present

## 2024-04-19 DIAGNOSIS — G8929 Other chronic pain: Secondary | ICD-10-CM

## 2024-04-19 DIAGNOSIS — Z79899 Other long term (current) drug therapy: Secondary | ICD-10-CM

## 2024-04-19 DIAGNOSIS — E1159 Type 2 diabetes mellitus with other circulatory complications: Secondary | ICD-10-CM

## 2024-04-19 DIAGNOSIS — F03B11 Unspecified dementia, moderate, with agitation: Secondary | ICD-10-CM

## 2024-04-19 DIAGNOSIS — M25562 Pain in left knee: Secondary | ICD-10-CM | POA: Diagnosis not present

## 2024-04-19 DIAGNOSIS — F419 Anxiety disorder, unspecified: Secondary | ICD-10-CM

## 2024-04-19 DIAGNOSIS — I152 Hypertension secondary to endocrine disorders: Secondary | ICD-10-CM

## 2024-04-19 LAB — POCT GLYCOSYLATED HEMOGLOBIN (HGB A1C): Hemoglobin A1C: 7.1 % — AB (ref 4.0–5.6)

## 2024-04-19 MED ORDER — MELOXICAM 7.5 MG PO TABS: 7.5000 mg | ORAL_TABLET | Freq: Every day | ORAL | 3 refills | Status: AC

## 2024-04-19 MED ORDER — ALPRAZOLAM 0.25 MG PO TABS
ORAL_TABLET | ORAL | 2 refills | Status: DC
Start: 2024-04-19 — End: 2024-05-22

## 2024-04-19 MED ORDER — PIOGLITAZONE HCL 30 MG PO TABS: 30.0000 mg | ORAL_TABLET | Freq: Every day | ORAL | 1 refills | Status: AC

## 2024-04-19 MED ORDER — LISINOPRIL 30 MG PO TABS: 30.0000 mg | ORAL_TABLET | Freq: Every day | ORAL | 5 refills | Status: DC

## 2024-04-19 MED ORDER — QUETIAPINE FUMARATE 25 MG PO TABS: 25.0000 mg | ORAL_TABLET | Freq: Every day | ORAL | 3 refills | Status: DC

## 2024-04-19 MED ORDER — DONEPEZIL HCL 10 MG PO TABS: 10.0000 mg | ORAL_TABLET | Freq: Every day | ORAL | 1 refills | Status: DC

## 2024-04-19 MED ORDER — AMLODIPINE BESYLATE 10 MG PO TABS: 10.0000 mg | ORAL_TABLET | Freq: Every day | ORAL | 1 refills | Status: DC

## 2024-04-19 MED ORDER — GLIMEPIRIDE 2 MG PO TABS: 2.0000 mg | ORAL_TABLET | Freq: Every day | ORAL | 1 refills | Status: DC

## 2024-04-19 MED ORDER — POLYSACCHARIDE IRON COMPLEX 150 MG PO CAPS: 150.0000 mg | ORAL_CAPSULE | Freq: Every day | ORAL | 1 refills | Status: AC

## 2024-04-19 NOTE — Progress Notes (Signed)
 [facility] [address] [place], [place] [address]  Internal MEDICINE  Office Visit Note  Patient Name: Anne Parrish  [id number]  [id number]  Date of Service: 04/19/2024  Chief Complaint  Patient presents with   Diabetes   Hypertension   Hyperlipidemia   Follow-up    HPI Anne Parrish presents for a follow-up visit for diabetes, dementia, trouble sleeping and hypertension  Diabetes -- slightly improved A1c at 7.1 today, from 7.3 in march this year  Sundowners -- takes alprazolam  as needed, due for refills.  Trouble sleeping sometimes -- alprazolam  is not helping that much with sleep, might need something else to help with sleep.  Hypertension -- controlled with current medication Chronic joint pains -- takes meloxicam  daily.     Current Medication: Outpatient Encounter Medications as of 04/19/2024  Medication Sig   QUEtiapine  (SEROQUEL ) 25 MG tablet Take 1 tablet (25 mg total) by mouth at bedtime.   acetaminophen  (TYLENOL ) 650 MG CR tablet Take 650 mg by mouth every 8 (eight) hours as needed for pain.   ALPRAZolam  (XANAX ) 0.25 MG tablet Take 1 tablet by mouth at noon and 1 tablet with supper daily as needed for anxiety/agitation   amLODipine  (NORVASC ) 10 MG tablet Take 1 tablet (10 mg total) by mouth daily.   Bioflavonoid Products (BIOFLEX PO) Take by mouth daily.   clotrimazole-betamethasone (LOTRISONE) cream Apply 1 Application topically daily.   donepezil  (ARICEPT ) 10 MG tablet Take 1 tablet (10 mg total) by mouth at bedtime.   glimepiride  (AMARYL ) 2 MG tablet Take 1 tablet (2 mg total) by mouth daily. With breakfast   glucose blood (ONETOUCH VERIO) test strip Use 1 test strip to check glucose one daily as needed for diabetes E11.65   iron  polysaccharides (NIFEREX) 150 MG capsule Take 1 capsule (150 mg total) by mouth daily.   lisinopril  (ZESTRIL ) 30 MG tablet Take 1 tablet (30 mg total) by mouth daily.   meloxicam  (MOBIC ) 7.5 MG tablet Take 1 tablet (7.5 mg total) by  mouth daily.   Multiple Vitamin (MULTIVITAMIN) capsule Take 1 capsule by mouth daily.   OneTouch Delica Lancets 33G MISC Use 1 lancet to check glucose once daily as needed for diabetes E11.65   pioglitazone  (ACTOS ) 30 MG tablet Take 1 tablet (30 mg total) by mouth daily.   [DISCONTINUED] ALPRAZolam  (XANAX ) 0.25 MG tablet Take 1 tablet by mouth at noon and 1 tablet with supper daily.   [DISCONTINUED] amLODipine  (NORVASC ) 10 MG tablet Take 1 tablet (10 mg total) by mouth daily.   [DISCONTINUED] donepezil  (ARICEPT ) 10 MG tablet Take 1 tablet (10 mg total) by mouth at bedtime.   [DISCONTINUED] glimepiride  (AMARYL ) 2 MG tablet Take 1 tablet (2 mg total) by mouth daily. With breakfast   [DISCONTINUED] iron  polysaccharides (NIFEREX) 150 MG capsule Take 1 capsule (150 mg total) by mouth daily.   [DISCONTINUED] lisinopril  (ZESTRIL ) 30 MG tablet Take 1 tablet (30 mg total) by mouth daily.   [DISCONTINUED] meloxicam  (MOBIC ) 7.5 MG tablet Take 7.5 mg by mouth daily.   [DISCONTINUED] pioglitazone  (ACTOS ) 30 MG tablet Take 1 tablet (30 mg total) by mouth daily.   No facility-administered encounter medications on file as of 04/19/2024.    Surgical History: Past Surgical History:  Procedure Laterality Date   APPENDECTOMY  1960's   BREAST BIOPSY Right 02/28/2018    STROMAL FIBROSIS, 5 MM, AND LOBULAR INVOLUTION.   COLONOSCOPY WITH PROPOFOL  N/A 04/15/2016   Procedure: COLONOSCOPY WITH PROPOFOL ;  Surgeon: Lamar ONEIDA Holmes, MD;  Location: [facility] ENDOSCOPY;  Service: Endoscopy;  Laterality: N/A;   COLONOSCOPY WITH PROPOFOL  N/A 05/27/2020   Procedure: COLONOSCOPY WITH PROPOFOL ;  Surgeon: Jinny Carmine, MD;  Location: [facility] ENDOSCOPY;  Service: Endoscopy;  Laterality: N/A;   REFRACTIVE SURGERY     TONSILLECTOMY AND ADENOIDECTOMY  1975    Medical History: Past Medical History:  Diagnosis Date   Basal cell carcinoma 07/17/2020   R lat canthus/lower eyelid ED&C    Diabetes mellitus without complication (HCC)     Hyperlipidemia    Hypertension     Family History: Family History  Problem Relation Age of Onset   Cancer Mother        lung   Thyroid  disease Mother    Cancer Father        prostate   Heart disease Father    Cancer Brother        prostate   Breast cancer Neg Hx     Social History   Socioeconomic History   Marital status: Divorced    Spouse name: Not on file   Number of children: 2   Years of education: Not on file   Highest education level: Some college, no degree  Occupational History   Occupation: retired   Occupation: owns Manufacturing engineer  Tobacco Use   Smoking status: Never   Smokeless tobacco: Never  Vaping Use   Vaping status: Never Used  Substance and Sexual Activity   Alcohol use: Not Currently   Drug use: No   Sexual activity: Not Currently  Other Topics Concern   Not on file  Social History Narrative   Not on file   Social Drivers of Health   Financial Resource Strain: Low Risk  (09/20/2022)   Overall Financial Resource Strain (CARDIA)    Difficulty of Paying Living Expenses: Not hard at all  Food Insecurity: No Food Insecurity (09/20/2022)   Hunger Vital Sign    Worried About Running Out of Food in the Last Year: Never true    Ran Out of Food in the Last Year: Never true  Transportation Needs: No Transportation Needs (09/20/2022)   PRAPARE - Administrator, Civil Service (Medical): No    Lack of Transportation (Non-Medical): No  Physical Activity: Inactive (09/20/2022)   Exercise Vital Sign    Days of Exercise per Week: 0 days    Minutes of Exercise per Session: 0 min  Stress: No Stress Concern Present (09/20/2022)   [employer] of Occupational Health - Occupational Stress Questionnaire    Feeling of Stress : Not at all  Social Connections: Moderately Integrated (09/20/2022)   Social Connection and Isolation Panel    Frequency of Communication with Friends and Family: More than three times a week    Frequency of Social Gatherings  with Friends and Family: Three times a week    Attends Religious Services: 1 to 4 times per year    Active Member of Clubs or Organizations: No    Attends Banker Meetings: Never    Marital Status: Living with partner  Intimate Partner Violence: Not At Risk (09/20/2022)   Humiliation, Afraid, Rape, and Kick questionnaire    Fear of Current or Ex-Partner: No    Emotionally Abused: No    Physically Abused: No    Sexually Abused: No      Review of Systems  Constitutional:  Positive for fatigue. Negative for chills and unexpected weight change.  HENT:  Negative for congestion, postnasal drip, rhinorrhea, sneezing and sore  throat.   Eyes:  Negative for redness.  Respiratory:  Negative for cough, chest tightness and shortness of breath.   Cardiovascular: Negative.  Negative for chest pain and palpitations.  Gastrointestinal:  Negative for abdominal pain, constipation, diarrhea, nausea and vomiting.  Genitourinary:  Negative for dysuria and frequency.  Musculoskeletal:  Negative for arthralgias, back pain, joint swelling and neck pain.  Skin:  Negative for rash.  Neurological: Negative.  Negative for tremors and numbness.  Hematological:  Negative for adenopathy. Does not bruise/bleed easily.  Psychiatric/Behavioral:  Positive for agitation, confusion and sleep disturbance. Negative for behavioral problems (Depression), self-injury and suicidal ideas. The patient is not nervous/anxious.     Vital Signs: BP 127/68   Pulse 60   Temp (!) 96.8 F (36 C)   Resp 16   Ht 5' 1 (1.549 m)   Wt 183 lb 9.6 oz (83.3 kg)   SpO2 99%   BMI 34.69 kg/m    Physical Exam Vitals reviewed.  Constitutional:      Appearance: Normal appearance. She is obese. She is not ill-appearing.  HENT:     Head: Normocephalic and atraumatic.  Eyes:     Pupils: Pupils are equal, round, and reactive to light.  Cardiovascular:     Rate and Rhythm: Normal rate and regular rhythm.  Pulmonary:      Effort: Pulmonary effort is normal. No respiratory distress.  Musculoskeletal:     Cervical back: Normal range of motion.  Skin:    General: Skin is warm and dry.     Capillary Refill: Capillary refill takes less than 2 seconds.  Neurological:     Mental Status: She is alert and oriented to person, place, and time.  Psychiatric:        Mood and Affect: Mood normal.        Behavior: Behavior normal.        Assessment/Plan: 1. Type 2 diabetes mellitus with other specified complication, without long-term current use of insulin  (HCC) (Primary) A1c is slightly improved to 7.1. continue medications as prescribed.  - POCT glycosylated hemoglobin (Hb A1C) - glimepiride  (AMARYL ) 2 MG tablet; Take 1 tablet (2 mg total) by mouth daily. With breakfast  Dispense: 90 tablet; Refill: 1 - pioglitazone  (ACTOS ) 30 MG tablet; Take 1 tablet (30 mg total) by mouth daily.  Dispense: 90 tablet; Refill: 1  2. Hypertension associated with type 2 diabetes mellitus (HCC) Stable, continue amlodipine  and lisinopril  as prescribed.  - amLODipine  (NORVASC ) 10 MG tablet; Take 1 tablet (10 mg total) by mouth daily.  Dispense: 90 tablet; Refill: 1 - lisinopril  (ZESTRIL ) 30 MG tablet; Take 1 tablet (30 mg total) by mouth daily.  Dispense: 30 tablet; Refill: 5  3. Chronic pain of left knee Continue meloxicam  as prescribed.  - meloxicam  (MOBIC ) 7.5 MG tablet; Take 1 tablet (7.5 mg total) by mouth daily.  Dispense: 90 tablet; Refill: 3  4. Encounter for medication review Medication list reviewed, updated and refills ordered.  - iron  polysaccharides (NIFEREX) 150 MG capsule; Take 1 capsule (150 mg total) by mouth daily.  Dispense: 90 capsule; Refill: 1  5. Moderate dementia with agitation, unspecified dementia type (HCC) Continue aricept  as prescribed and start quetiapine  as prescribed to help with agitation and sleep as well.  - donepezil  (ARICEPT ) 10 MG tablet; Take 1 tablet (10 mg total) by mouth at bedtime.   Dispense: 90 tablet; Refill: 1 - QUEtiapine  (SEROQUEL ) 25 MG tablet; Take 1 tablet (25 mg total) by mouth at bedtime.  Dispense: 30 tablet; Refill: 3  6. Anxiety Continue prn alprazolam  as prescribed.  - ALPRAZolam  (XANAX ) 0.25 MG tablet; Take 1 tablet by mouth at noon and 1 tablet with supper daily as needed for anxiety/agitation  Dispense: 60 tablet; Refill: 2   General Counseling: Anne Parrish verbalizes understanding of the findings of todays visit and agrees with plan of treatment. I have discussed any further diagnostic evaluation that may be needed or ordered today. We also reviewed her medications today. she has been encouraged to call the office with any questions or concerns that should arise related to todays visit.    Orders Placed This Encounter  Procedures   POCT glycosylated hemoglobin (Hb A1C)    Meds ordered this encounter  Medications   donepezil  (ARICEPT ) 10 MG tablet    Sig: Take 1 tablet (10 mg total) by mouth at bedtime.    Dispense:  90 tablet    Refill:  1    Note increased dose, discontinue 5 mg dose and fill new script today for 90 day supply.   amLODipine  (NORVASC ) 10 MG tablet    Sig: Take 1 tablet (10 mg total) by mouth daily.    Dispense:  90 tablet    Refill:  1    Patient request 90 day supply   ALPRAZolam  (XANAX ) 0.25 MG tablet    Sig: Take 1 tablet by mouth at noon and 1 tablet with supper daily as needed for anxiety/agitation    Dispense:  60 tablet    Refill:  2    For future refills   glimepiride  (AMARYL ) 2 MG tablet    Sig: Take 1 tablet (2 mg total) by mouth daily. With breakfast    Dispense:  90 tablet    Refill:  1    For future refills, 90 day supply please   iron  polysaccharides (NIFEREX) 150 MG capsule    Sig: Take 1 capsule (150 mg total) by mouth daily.    Dispense:  90 capsule    Refill:  1   lisinopril  (ZESTRIL ) 30 MG tablet    Sig: Take 1 tablet (30 mg total) by mouth daily.    Dispense:  30 tablet    Refill:  5   pioglitazone   (ACTOS ) 30 MG tablet    Sig: Take 1 tablet (30 mg total) by mouth daily.    Dispense:  90 tablet    Refill:  1    For future refills, 90 day supply please. Dx code E11.65   QUEtiapine  (SEROQUEL ) 25 MG tablet    Sig: Take 1 tablet (25 mg total) by mouth at bedtime.    Dispense:  30 tablet    Refill:  3    Fill new script today   meloxicam  (MOBIC ) 7.5 MG tablet    Sig: Take 1 tablet (7.5 mg total) by mouth daily.    Dispense:  90 tablet    Refill:  3    Fill new script today    Return in about 1 month (around 05/20/2024) for F/U, eval new med, Jef Futch PCP.   Total time spent:30 Minutes Time spent includes review of chart, medications, test results, and follow up plan with the patient.   [place] Controlled Substance Database was reviewed by me.  This patient was seen by Mardy Maxin, FNP-C in collaboration with Dr. Sigrid Bathe as a part of collaborative care agreement.   Ellajane Stong R. Maxin, MSN, FNP-C Internal medicine

## 2024-05-15 ENCOUNTER — Encounter: Payer: Self-pay | Admitting: Nurse Practitioner

## 2024-05-15 DIAGNOSIS — F419 Anxiety disorder, unspecified: Secondary | ICD-10-CM | POA: Insufficient documentation

## 2024-05-15 DIAGNOSIS — F03B11 Unspecified dementia, moderate, with agitation: Secondary | ICD-10-CM | POA: Insufficient documentation

## 2024-05-15 DIAGNOSIS — G8929 Other chronic pain: Secondary | ICD-10-CM | POA: Insufficient documentation

## 2024-05-22 ENCOUNTER — Ambulatory Visit (INDEPENDENT_AMBULATORY_CARE_PROVIDER_SITE_OTHER): Admitting: Nurse Practitioner

## 2024-05-22 ENCOUNTER — Encounter: Payer: Self-pay | Admitting: Nurse Practitioner

## 2024-05-22 VITALS — BP 135/70 | HR 62 | Temp 97.3°F | Resp 16 | Ht 61.0 in | Wt 190.8 lb

## 2024-05-22 DIAGNOSIS — R300 Dysuria: Secondary | ICD-10-CM | POA: Diagnosis not present

## 2024-05-22 DIAGNOSIS — G4701 Insomnia due to medical condition: Secondary | ICD-10-CM

## 2024-05-22 DIAGNOSIS — F419 Anxiety disorder, unspecified: Secondary | ICD-10-CM | POA: Diagnosis not present

## 2024-05-22 DIAGNOSIS — R319 Hematuria, unspecified: Secondary | ICD-10-CM

## 2024-05-22 DIAGNOSIS — F03B11 Unspecified dementia, moderate, with agitation: Secondary | ICD-10-CM

## 2024-05-22 DIAGNOSIS — N390 Urinary tract infection, site not specified: Secondary | ICD-10-CM

## 2024-05-22 LAB — POCT URINALYSIS DIPSTICK
Bilirubin, UA: NEGATIVE
Glucose, UA: NEGATIVE
Nitrite, UA: NEGATIVE
Protein, UA: POSITIVE — AB
Spec Grav, UA: 1.015 (ref 1.010–1.025)
Urobilinogen, UA: 0.2 U/dL
pH, UA: 6 (ref 5.0–8.0)

## 2024-05-22 MED ORDER — CIPROFLOXACIN HCL 500 MG PO TABS: 500.0000 mg | ORAL_TABLET | Freq: Two times a day (BID) | ORAL | 0 refills | Status: AC

## 2024-05-22 MED ORDER — ALPRAZOLAM 0.25 MG PO TABS
ORAL_TABLET | ORAL | 2 refills | Status: DC
Start: 2024-05-22 — End: 2024-06-21

## 2024-05-22 MED ORDER — QUETIAPINE FUMARATE 50 MG PO TABS: 50.0000 mg | ORAL_TABLET | Freq: Every day | ORAL | 1 refills | Status: DC

## 2024-05-22 NOTE — Progress Notes (Signed)
 [facility] [address] [place], [place] [address]  Internal MEDICINE  Office Visit Note  Patient Name: Anne Parrish  [id number]  [id number]  Date of Service: 05/22/2024  Chief Complaint  Patient presents with   Diabetes   Hypertension   Hyperlipidemia   Follow-up    HPI Davyn presents for a follow-up visit for insomnia, agitation, and possible UTI.  Insomnia -- seroquel  was added at her last office visit but she has not had any significant improvement in sleep or agitation.  Irritability and agitation -- stil an issue during the day  Possible UTI -- increased frequency and urgency, some burning with urination and some pain in the suprapubic area sometimes. Urinalysis done if office today, positive for moderate blood and moderate leukocytes. Negative for nitrites.    Current Medication: Outpatient Encounter Medications as of 05/22/2024  Medication Sig   ciprofloxacin  (CIPRO ) 500 MG tablet Take 1 tablet (500 mg total) by mouth 2 (two) times daily for 5 days. Take with food   QUEtiapine  (SEROQUEL ) 50 MG tablet Take 1 tablet (50 mg total) by mouth at bedtime.   acetaminophen  (TYLENOL ) 650 MG CR tablet Take 650 mg by mouth every 8 (eight) hours as needed for pain.   ALPRAZolam  (XANAX ) 0.25 MG tablet Take 1 tablet by mouth at noon and 1 tablet with supper daily as needed for anxiety/agitation   amLODipine  (NORVASC ) 10 MG tablet Take 1 tablet (10 mg total) by mouth daily.   Bioflavonoid Products (BIOFLEX PO) Take by mouth daily.   clotrimazole-betamethasone (LOTRISONE) cream Apply 1 Application topically daily.   donepezil  (ARICEPT ) 10 MG tablet Take 1 tablet (10 mg total) by mouth at bedtime.   glimepiride  (AMARYL ) 2 MG tablet Take 1 tablet (2 mg total) by mouth daily. With breakfast   glucose blood (ONETOUCH VERIO) test strip Use 1 test strip to check glucose one daily as needed for diabetes E11.65   iron  polysaccharides (NIFEREX) 150 MG capsule Take 1 capsule (150 mg  total) by mouth daily.   lisinopril  (ZESTRIL ) 30 MG tablet Take 1 tablet (30 mg total) by mouth daily.   meloxicam  (MOBIC ) 7.5 MG tablet Take 1 tablet (7.5 mg total) by mouth daily.   Multiple Vitamin (MULTIVITAMIN) capsule Take 1 capsule by mouth daily.   OneTouch Delica Lancets 33G MISC Use 1 lancet to check glucose once daily as needed for diabetes E11.65   pioglitazone  (ACTOS ) 30 MG tablet Take 1 tablet (30 mg total) by mouth daily.   [DISCONTINUED] ALPRAZolam  (XANAX ) 0.25 MG tablet Take 1 tablet by mouth at noon and 1 tablet with supper daily as needed for anxiety/agitation   [DISCONTINUED] QUEtiapine  (SEROQUEL ) 25 MG tablet Take 1 tablet (25 mg total) by mouth at bedtime.   No facility-administered encounter medications on file as of 05/22/2024.    Surgical History: Past Surgical History:  Procedure Laterality Date   APPENDECTOMY  1960's   BREAST BIOPSY Right 02/28/2018    STROMAL FIBROSIS, 5 MM, AND LOBULAR INVOLUTION.   COLONOSCOPY WITH PROPOFOL  N/A 04/15/2016   Procedure: COLONOSCOPY WITH PROPOFOL ;  Surgeon: Lamar ONEIDA Holmes, MD;  Location: [facility] ENDOSCOPY;  Service: Endoscopy;  Laterality: N/A;   COLONOSCOPY WITH PROPOFOL  N/A 05/27/2020   Procedure: COLONOSCOPY WITH PROPOFOL ;  Surgeon: Jinny Carmine, MD;  Location: [facility] ENDOSCOPY;  Service: Endoscopy;  Laterality: N/A;   REFRACTIVE SURGERY     TONSILLECTOMY AND ADENOIDECTOMY  1975    Medical History: Past Medical History:  Diagnosis Date   Basal cell  carcinoma 07/17/2020   R lat canthus/lower eyelid ED&C    Diabetes mellitus without complication (HCC)    Hyperlipidemia    Hypertension     Family History: Family History  Problem Relation Age of Onset   Cancer Mother        lung   Thyroid  disease Mother    Cancer Father        prostate   Heart disease Father    Cancer Brother        prostate   Breast cancer Neg Hx     Social History   Socioeconomic History   Marital status: Divorced    Spouse name: Not on file    Number of children: 2   Years of education: Not on file   Highest education level: Some college, no degree  Occupational History   Occupation: retired   Occupation: owns Manufacturing engineer  Tobacco Use   Smoking status: Never   Smokeless tobacco: Never  Vaping Use   Vaping status: Never Used  Substance and Sexual Activity   Alcohol use: Not Currently   Drug use: No   Sexual activity: Not Currently  Other Topics Concern   Not on file  Social History Narrative   Not on file   Social Drivers of Health   Financial Resource Strain: Low Risk  (09/20/2022)   Overall Financial Resource Strain (CARDIA)    Difficulty of Paying Living Expenses: Not hard at all  Food Insecurity: No Food Insecurity (09/20/2022)   Hunger Vital Sign    Worried About Running Out of Food in the Last Year: Never true    Ran Out of Food in the Last Year: Never true  Transportation Needs: No Transportation Needs (09/20/2022)   PRAPARE - Administrator, Civil Service (Medical): No    Lack of Transportation (Non-Medical): No  Physical Activity: Inactive (09/20/2022)   Exercise Vital Sign    Days of Exercise per Week: 0 days    Minutes of Exercise per Session: 0 min  Stress: No Stress Concern Present (09/20/2022)   [employer] of Occupational Health - Occupational Stress Questionnaire    Feeling of Stress : Not at all  Social Connections: Moderately Integrated (09/20/2022)   Social Connection and Isolation Panel    Frequency of Communication with Friends and Family: More than three times a week    Frequency of Social Gatherings with Friends and Family: Three times a week    Attends Religious Services: 1 to 4 times per year    Active Member of Clubs or Organizations: No    Attends Banker Meetings: Never    Marital Status: Living with partner  Intimate Partner Violence: Not At Risk (09/20/2022)   Humiliation, Afraid, Rape, and Kick questionnaire    Fear of Current or Ex-Partner: No     Emotionally Abused: No    Physically Abused: No    Sexually Abused: No      Review of Systems  Constitutional:  Positive for fatigue. Negative for chills and unexpected weight change.  HENT:  Negative for congestion, postnasal drip, rhinorrhea, sneezing and sore throat.   Eyes:  Negative for redness.  Respiratory:  Negative for cough, chest tightness and shortness of breath.   Cardiovascular: Negative.  Negative for chest pain and palpitations.  Gastrointestinal:  Negative for abdominal pain, constipation, diarrhea, nausea and vomiting.  Genitourinary:  Negative for dysuria and frequency.  Musculoskeletal:  Negative for arthralgias, back pain, joint swelling and neck pain.  Skin:  Negative for rash.  Neurological: Negative.  Negative for tremors and numbness.  Hematological:  Negative for adenopathy. Does not bruise/bleed easily.  Psychiatric/Behavioral:  Positive for agitation, confusion and sleep disturbance. Negative for behavioral problems (Depression), self-injury and suicidal ideas. The patient is not nervous/anxious.     Vital Signs: BP 135/70 Comment: 163/69  Pulse 62   Temp (!) 97.3 F (36.3 C)   Resp 16   Ht 5' 1 (1.549 m)   Wt 190 lb 12.8 oz (86.5 kg)   SpO2 99%   BMI 36.05 kg/m    Physical Exam Vitals reviewed.  Constitutional:      Appearance: Normal appearance. She is obese. She is not ill-appearing.  HENT:     Head: Normocephalic and atraumatic.  Eyes:     Pupils: Pupils are equal, round, and reactive to light.  Cardiovascular:     Rate and Rhythm: Normal rate and regular rhythm.  Pulmonary:     Effort: Pulmonary effort is normal. No respiratory distress.  Musculoskeletal:     Cervical back: Normal range of motion.  Skin:    General: Skin is warm and dry.     Capillary Refill: Capillary refill takes less than 2 seconds.  Neurological:     Mental Status: She is alert and oriented to person, place, and time.  Psychiatric:        Mood and Affect:  Mood normal.        Behavior: Behavior normal.        Assessment/Plan: 1. Urinary tract infection with hematuria, site unspecified (Primary) Urien sent for culture, cipro  prescribed, take until gone  - ciprofloxacin  (CIPRO ) 500 MG tablet; Take 1 tablet (500 mg total) by mouth 2 (two) times daily for 5 days. Take with food  Dispense: 10 tablet; Refill: 0 - CULTURE, URINE COMPREHENSIVE  2. Insomnia due to medical condition Seroquel  dose increased, and continue prn alprazolam  as prescribed.  - QUEtiapine  (SEROQUEL ) 50 MG tablet; Take 1 tablet (50 mg total) by mouth at bedtime.  Dispense: 90 tablet; Refill: 1 - ALPRAZolam  (XANAX ) 0.25 MG tablet; Take 1 tablet by mouth at noon and 1 tablet with supper daily as needed for anxiety/agitation  Dispense: 60 tablet; Refill: 2  3. Dysuria Urinalysis is abnormal, urine sent for culture  - POCT Urinalysis Dipstick  4. Anxiety Continue alprazolam  as needed as prescribed.  - ALPRAZolam  (XANAX ) 0.25 MG tablet; Take 1 tablet by mouth at noon and 1 tablet with supper daily as needed for anxiety/agitation  Dispense: 60 tablet; Refill: 2  5. Moderate dementia with agitation, unspecified dementia type (HCC) Seroquel  dose increased and alprazolam  reordered.  - QUEtiapine  (SEROQUEL ) 50 MG tablet; Take 1 tablet (50 mg total) by mouth at bedtime.  Dispense: 90 tablet; Refill: 1 - ALPRAZolam  (XANAX ) 0.25 MG tablet; Take 1 tablet by mouth at noon and 1 tablet with supper daily as needed for anxiety/agitation  Dispense: 60 tablet; Refill: 2   General Counseling: Atziri verbalizes understanding of the findings of todays visit and agrees with plan of treatment. I have discussed any further diagnostic evaluation that may be needed or ordered today. We also reviewed her medications today. she has been encouraged to call the office with any questions or concerns that should arise related to todays visit.    Orders Placed This Encounter  Procedures   POCT  Urinalysis Dipstick    Meds ordered this encounter  Medications   QUEtiapine  (SEROQUEL ) 50 MG tablet    Sig: Take  1 tablet (50 mg total) by mouth at bedtime.    Dispense:  90 tablet    Refill:  1    Note increased dose, Fill new script today, discontinue 25 mg tablet   ALPRAZolam  (XANAX ) 0.25 MG tablet    Sig: Take 1 tablet by mouth at noon and 1 tablet with supper daily as needed for anxiety/agitation    Dispense:  60 tablet    Refill:  2    Discontinue previous orders for alprazolam  and please fill script today.   ciprofloxacin  (CIPRO ) 500 MG tablet    Sig: Take 1 tablet (500 mg total) by mouth 2 (two) times daily for 5 days. Take with food    Dispense:  10 tablet    Refill:  0    Fill new script today    Return in about 1 month (around 06/21/2024) for F/U, eval new med, Sergey Ishler PCP.   Total time spent:30 Minutes Time spent includes review of chart, medications, test results, and follow up plan with the patient.   [place] Controlled Substance Database was reviewed by me.  This patient was seen by Mardy Maxin, FNP-C in collaboration with Dr. Sigrid Bathe as a part of collaborative care agreement.   Ellias Mcelreath R. Maxin, MSN, FNP-C Internal medicine

## 2024-05-23 ENCOUNTER — Encounter: Payer: Self-pay | Admitting: Nurse Practitioner

## 2024-05-25 ENCOUNTER — Other Ambulatory Visit: Payer: Self-pay

## 2024-05-25 DIAGNOSIS — F03B11 Unspecified dementia, moderate, with agitation: Secondary | ICD-10-CM

## 2024-05-25 MED ORDER — DONEPEZIL HCL 10 MG PO TABS: 10.0000 mg | ORAL_TABLET | Freq: Every day | ORAL | 1 refills | Status: AC

## 2024-05-27 LAB — CULTURE, URINE COMPREHENSIVE

## 2024-06-21 ENCOUNTER — Ambulatory Visit: Admitting: Nurse Practitioner

## 2024-06-21 ENCOUNTER — Encounter: Payer: Self-pay | Admitting: Nurse Practitioner

## 2024-06-21 VITALS — BP 160/70 | HR 63 | Temp 96.7°F | Resp 16 | Ht 61.0 in | Wt 195.6 lb

## 2024-06-21 DIAGNOSIS — N390 Urinary tract infection, site not specified: Secondary | ICD-10-CM | POA: Diagnosis not present

## 2024-06-21 DIAGNOSIS — F419 Anxiety disorder, unspecified: Secondary | ICD-10-CM

## 2024-06-21 DIAGNOSIS — I10 Essential (primary) hypertension: Secondary | ICD-10-CM

## 2024-06-21 DIAGNOSIS — G4701 Insomnia due to medical condition: Secondary | ICD-10-CM

## 2024-06-21 DIAGNOSIS — F03B11 Unspecified dementia, moderate, with agitation: Secondary | ICD-10-CM

## 2024-06-21 MED ORDER — ALPRAZOLAM 0.25 MG PO TABS: ORAL_TABLET | ORAL | 2 refills | Status: AC

## 2024-06-21 MED ORDER — LISINOPRIL 40 MG PO TABS: 40.0000 mg | ORAL_TABLET | Freq: Every day | ORAL | 3 refills | Status: AC

## 2024-06-21 MED ORDER — QUETIAPINE FUMARATE 50 MG PO TABS: 50.0000 mg | ORAL_TABLET | Freq: Two times a day (BID) | ORAL | 1 refills | Status: DC

## 2024-06-21 MED ORDER — NITROFURANTOIN MONOHYD MACRO 100 MG PO CAPS: 100.0000 mg | ORAL_CAPSULE | Freq: Two times a day (BID) | ORAL | 0 refills | Status: AC

## 2024-06-21 NOTE — Progress Notes (Signed)
 [facility] [address] [place], [place] [address]  Internal MEDICINE  Office Visit Note  Patient Name: Anne Parrish  [id number]  [id number]  Date of Service: 06/21/2024  Chief Complaint  Patient presents with   Diabetes   Hypertension   Hyperlipidemia   Follow-up    HPI Anne Parrish presents for a follow-up visit for hypertension, UTI, dementia, agitation, insomnia, and anxiety.  Hypertension -- BP is elevated, she has taken her medication today but it is still elevated.  Recurrent UTI -- took cipro  last month, but symptoms came back due to wiping back to front when using the toilet per her caregiver.  Dementia with agitation and behavioral disturbances Insomnia --increased dose of seroquel  is not helping her sleep at night  Anxiety -- has alprazolam  as needed     Current Medication: Outpatient Encounter Medications as of 06/21/2024  Medication Sig   lisinopril  (ZESTRIL ) 40 MG tablet Take 1 tablet (40 mg total) by mouth daily.   nitrofurantoin, macrocrystal-monohydrate, (MACROBID) 100 MG capsule Take 1 capsule (100 mg total) by mouth 2 (two) times daily for 7 days. Take with food   acetaminophen  (TYLENOL ) 650 MG CR tablet Take 650 mg by mouth every 8 (eight) hours as needed for pain.   ALPRAZolam  (XANAX ) 0.25 MG tablet Take 1 tablet by mouth at noon and 1 tablet with supper daily as needed for anxiety/agitation   amLODipine  (NORVASC ) 10 MG tablet Take 1 tablet (10 mg total) by mouth daily.   Bioflavonoid Products (BIOFLEX PO) Take by mouth daily.   clotrimazole-betamethasone (LOTRISONE) cream Apply 1 Application topically daily.   donepezil  (ARICEPT ) 10 MG tablet Take 1 tablet (10 mg total) by mouth at bedtime.   glimepiride  (AMARYL ) 2 MG tablet Take 1 tablet (2 mg total) by mouth daily. With breakfast   glucose blood (ONETOUCH VERIO) test strip Use 1 test strip to check glucose one daily as needed for diabetes E11.65 (Patient not taking: Reported on 06/21/2024)   iron   polysaccharides (NIFEREX) 150 MG capsule Take 1 capsule (150 mg total) by mouth daily.   meloxicam  (MOBIC ) 7.5 MG tablet Take 1 tablet (7.5 mg total) by mouth daily.   Multiple Vitamin (MULTIVITAMIN) capsule Take 1 capsule by mouth daily.   OneTouch Delica Lancets 33G MISC Use 1 lancet to check glucose once daily as needed for diabetes E11.65 (Patient not taking: Reported on 06/21/2024)   pioglitazone  (ACTOS ) 30 MG tablet Take 1 tablet (30 mg total) by mouth daily.   QUEtiapine  (SEROQUEL ) 50 MG tablet Take 1 tablet (50 mg total) by mouth 2 (two) times daily.   [DISCONTINUED] ALPRAZolam  (XANAX ) 0.25 MG tablet Take 1 tablet by mouth at noon and 1 tablet with supper daily as needed for anxiety/agitation (Patient not taking: Reported on 06/21/2024)   [DISCONTINUED] lisinopril  (ZESTRIL ) 30 MG tablet Take 1 tablet (30 mg total) by mouth daily.   [DISCONTINUED] QUEtiapine  (SEROQUEL ) 50 MG tablet Take 1 tablet (50 mg total) by mouth at bedtime.   No facility-administered encounter medications on file as of 06/21/2024.    Surgical History: Past Surgical History:  Procedure Laterality Date   APPENDECTOMY  1960's   BREAST BIOPSY Right 02/28/2018    STROMAL FIBROSIS, 5 MM, AND LOBULAR INVOLUTION.   COLONOSCOPY WITH PROPOFOL  N/A 04/15/2016   Procedure: COLONOSCOPY WITH PROPOFOL ;  Surgeon: Lamar ONEIDA Holmes, MD;  Location: [facility] ENDOSCOPY;  Service: Endoscopy;  Laterality: N/A;   COLONOSCOPY WITH PROPOFOL  N/A 05/27/2020   Procedure: COLONOSCOPY WITH PROPOFOL ;  Surgeon: Jinny,  Darren, MD;  Location: [facility] ENDOSCOPY;  Service: Endoscopy;  Laterality: N/A;   REFRACTIVE SURGERY     TONSILLECTOMY AND ADENOIDECTOMY  1975    Medical History: Past Medical History:  Diagnosis Date   Basal cell carcinoma 07/17/2020   R lat canthus/lower eyelid ED&C    Diabetes mellitus without complication (HCC)    Hyperlipidemia    Hypertension     Family History: Family History  Problem Relation Age of Onset   Cancer Mother         lung   Thyroid  disease Mother    Cancer Father        prostate   Heart disease Father    Cancer Brother        prostate   Breast cancer Neg Hx     Social History   Socioeconomic History   Marital status: Divorced    Spouse name: Not on file   Number of children: 2   Years of education: Not on file   Highest education level: Some college, no degree  Occupational History   Occupation: retired   Occupation: owns Manufacturing engineer  Tobacco Use   Smoking status: Never   Smokeless tobacco: Never  Vaping Use   Vaping status: Never Used  Substance and Sexual Activity   Alcohol use: Not Currently   Drug use: No   Sexual activity: Not Currently  Other Topics Concern   Not on file  Social History Narrative   Not on file   Social Drivers of Health   Financial Resource Strain: Low Risk  (09/20/2022)   Overall Financial Resource Strain (CARDIA)    Difficulty of Paying Living Expenses: Not hard at all  Food Insecurity: No Food Insecurity (09/20/2022)   Hunger Vital Sign    Worried About Running Out of Food in the Last Year: Never true    Ran Out of Food in the Last Year: Never true  Transportation Needs: No Transportation Needs (09/20/2022)   PRAPARE - Administrator, Civil Service (Medical): No    Lack of Transportation (Non-Medical): No  Physical Activity: Inactive (09/20/2022)   Exercise Vital Sign    Days of Exercise per Week: 0 days    Minutes of Exercise per Session: 0 min  Stress: No Stress Concern Present (09/20/2022)   [employer] of Occupational Health - Occupational Stress Questionnaire    Feeling of Stress : Not at all  Social Connections: Moderately Integrated (09/20/2022)   Social Connection and Isolation Panel    Frequency of Communication with Friends and Family: More than three times a week    Frequency of Social Gatherings with Friends and Family: Three times a week    Attends Religious Services: 1 to 4 times per year    Active Member of  Clubs or Organizations: No    Attends Banker Meetings: Never    Marital Status: Living with partner  Intimate Partner Violence: Not At Risk (09/20/2022)   Humiliation, Afraid, Rape, and Kick questionnaire    Fear of Current or Ex-Partner: No    Emotionally Abused: No    Physically Abused: No    Sexually Abused: No      Review of Systems  Constitutional:  Positive for fatigue. Negative for chills and unexpected weight change.  HENT:  Negative for congestion, postnasal drip, rhinorrhea, sneezing and sore throat.   Eyes:  Negative for redness.  Respiratory:  Negative for cough, chest tightness, shortness of breath and wheezing.   Cardiovascular: Negative.  Negative for chest pain and palpitations.  Gastrointestinal:  Negative for abdominal pain, constipation, diarrhea, nausea and vomiting.  Genitourinary:  Positive for dysuria, flank pain, frequency and urgency.  Musculoskeletal:  Negative for arthralgias, back pain, joint swelling and neck pain.  Skin:  Negative for rash.  Neurological: Negative.  Negative for tremors and numbness.  Hematological:  Negative for adenopathy. Does not bruise/bleed easily.  Psychiatric/Behavioral:  Positive for agitation, confusion and sleep disturbance. Negative for behavioral problems (Depression), self-injury and suicidal ideas. The patient is nervous/anxious.     Vital Signs: BP (!) 160/70 Comment: 170/74  Pulse 63   Temp (!) 96.7 F (35.9 C)   Resp 16   Ht 5' 1 (1.549 m)   Wt 195 lb 9.6 oz (88.7 kg)   SpO2 98%   BMI 36.96 kg/m    Physical Exam Vitals reviewed.  Constitutional:      Appearance: Normal appearance. She is obese. She is not ill-appearing.  HENT:     Head: Normocephalic and atraumatic.  Eyes:     Pupils: Pupils are equal, round, and reactive to light.  Cardiovascular:     Rate and Rhythm: Normal rate and regular rhythm.  Pulmonary:     Effort: Pulmonary effort is normal. No respiratory distress.   Musculoskeletal:     Cervical back: Normal range of motion.  Skin:    General: Skin is warm and dry.     Capillary Refill: Capillary refill takes less than 2 seconds.  Neurological:     Mental Status: She is alert and oriented to person, place, and time.  Psychiatric:        Mood and Affect: Mood normal.        Behavior: Behavior normal.        Assessment/Plan: 1. Essential hypertension (Primary) Lisinopril  dose increased, follow up in 4 weeks  - lisinopril  (ZESTRIL ) 40 MG tablet; Take 1 tablet (40 mg total) by mouth daily.  Dispense: 30 tablet; Refill: 3  2. Urinary tract infection without hematuria, site unspecified Nitrofurantoin prescribed, take until gone  - nitrofurantoin, macrocrystal-monohydrate, (MACROBID) 100 MG capsule; Take 1 capsule (100 mg total) by mouth 2 (two) times daily for 7 days. Take with food  Dispense: 14 capsule; Refill: 0  3. Obesity, morbid (HCC) Noted with BMI 36.96, encouraged patient to eat small portions and limit carbohydrates.   4. Insomnia due to medical condition Seroquel  increased to twice daily. May continue alprazolam  as prescribed.  - ALPRAZolam  (XANAX ) 0.25 MG tablet; Take 1 tablet by mouth at noon and 1 tablet with supper daily as needed for anxiety/agitation  Dispense: 60 tablet; Refill: 2 - QUEtiapine  (SEROQUEL ) 50 MG tablet; Take 1 tablet (50 mg total) by mouth 2 (two) times daily.  Dispense: 180 tablet; Refill: 1  5. Moderate dementia with agitation, unspecified dementia type (HCC) Continue prn alprazolam  as prescribed. Seroquel  increased to twice daily.  - ALPRAZolam  (XANAX ) 0.25 MG tablet; Take 1 tablet by mouth at noon and 1 tablet with supper daily as needed for anxiety/agitation  Dispense: 60 tablet; Refill: 2 - QUEtiapine  (SEROQUEL ) 50 MG tablet; Take 1 tablet (50 mg total) by mouth 2 (two) times daily.  Dispense: 180 tablet; Refill: 1  6. Anxiety Continue prn alprazolam  as prescribed  - ALPRAZolam  (XANAX ) 0.25 MG tablet;  Take 1 tablet by mouth at noon and 1 tablet with supper daily as needed for anxiety/agitation  Dispense: 60 tablet; Refill: 2   General Counseling: [place] understanding of the findings of todays  visit and agrees with plan of treatment. I have discussed any further diagnostic evaluation that may be needed or ordered today. We also reviewed her medications today. she has been encouraged to call the office with any questions or concerns that should arise related to todays visit.    No orders of the defined types were placed in this encounter.   Meds ordered this encounter  Medications   ALPRAZolam  (XANAX ) 0.25 MG tablet    Sig: Take 1 tablet by mouth at noon and 1 tablet with supper daily as needed for anxiety/agitation    Dispense:  60 tablet    Refill:  2    Discontinue previous orders for alprazolam  and please fill script today.   QUEtiapine  (SEROQUEL ) 50 MG tablet    Sig: Take 1 tablet (50 mg total) by mouth 2 (two) times daily.    Dispense:  180 tablet    Refill:  1    Note change in frequency and number of tablets. Please fill new script today.   nitrofurantoin, macrocrystal-monohydrate, (MACROBID) 100 MG capsule    Sig: Take 1 capsule (100 mg total) by mouth 2 (two) times daily for 7 days. Take with food    Dispense:  14 capsule    Refill:  0    Fill new script today   lisinopril  (ZESTRIL ) 40 MG tablet    Sig: Take 1 tablet (40 mg total) by mouth daily.    Dispense:  30 tablet    Refill:  3    Note increased dose, Discontinue 30 mg dose and fill new script today    Return in about 4 weeks (around 07/19/2024) for F/U, eval new med, Branna Cortina PCP.   Total time spent:30 Minutes Time spent includes review of chart, medications, test results, and follow up plan with the patient.   [place] Controlled Substance Database was reviewed by me.  This patient was seen by Mardy Maxin, FNP-C in collaboration with Dr. Sigrid Bathe as a part of collaborative care  agreement.   Kacey Dysert R. Maxin, MSN, FNP-C Internal medicine

## 2024-07-19 ENCOUNTER — Encounter: Payer: Self-pay | Admitting: Nurse Practitioner

## 2024-07-19 ENCOUNTER — Ambulatory Visit (INDEPENDENT_AMBULATORY_CARE_PROVIDER_SITE_OTHER): Admitting: Nurse Practitioner

## 2024-07-19 VITALS — BP 150/70 | HR 66 | Temp 97.2°F | Resp 16 | Ht 61.0 in | Wt 199.6 lb

## 2024-07-19 DIAGNOSIS — I10 Essential (primary) hypertension: Secondary | ICD-10-CM | POA: Diagnosis not present

## 2024-07-19 DIAGNOSIS — N390 Urinary tract infection, site not specified: Secondary | ICD-10-CM

## 2024-07-19 DIAGNOSIS — Z9181 History of falling: Secondary | ICD-10-CM

## 2024-07-19 DIAGNOSIS — F03B11 Unspecified dementia, moderate, with agitation: Secondary | ICD-10-CM

## 2024-07-19 DIAGNOSIS — G4701 Insomnia due to medical condition: Secondary | ICD-10-CM | POA: Diagnosis not present

## 2024-07-19 MED ORDER — HYDRALAZINE HCL 10 MG PO TABS: 10.0000 mg | ORAL_TABLET | Freq: Two times a day (BID) | ORAL | 5 refills | Status: DC

## 2024-07-19 MED ORDER — SULFAMETHOXAZOLE-TRIMETHOPRIM 800-160 MG PO TABS: 1.0000 | ORAL_TABLET | Freq: Two times a day (BID) | ORAL | 0 refills | Status: AC

## 2024-07-19 NOTE — Progress Notes (Signed)
 [facility] [address] [place], [place] [address]  Internal MEDICINE  Office Visit Note  Patient Name: Anne Parrish  [id number]  [id number]  Date of Service: 07/19/2024  Chief Complaint  Patient presents with   Diabetes   Hypertension   Hyperlipidemia   Follow-up    HPI Anne Parrish presents for a follow-up visit for recent fall, UTI, dementia and hypertension.  Recent fall -- happened this past Saturday, landed on carpet, no injuries were sustained UTI -- prior UTI was treated with nitrofurantoin  which was intermediate on the prior urine culture. Unable to provide urine sample today.  Dementia -- has insomnia and prone to wandering at night. The increase in seroquel  did improve her symptoms some but she is still waking up after a couple of hours at night.  Hypertension -- blood pressure was elevated at last visit and lisinopril  dose was increased to 40 mg daily. Her BP is elevated today, rechecked.     Current Medication: Outpatient Encounter Medications as of 07/19/2024  Medication Sig   hydrALAZINE  (APRESOLINE ) 10 MG tablet Take 1 tablet (10 mg total) by mouth in the morning and at bedtime.   sulfamethoxazole -trimethoprim  (BACTRIM  DS) 800-160 MG tablet Take 1 tablet by mouth 2 (two) times daily for 5 days. Take with food   acetaminophen  (TYLENOL ) 650 MG CR tablet Take 650 mg by mouth every 8 (eight) hours as needed for pain.   ALPRAZolam  (XANAX ) 0.25 MG tablet Take 1 tablet by mouth at noon and 1 tablet with supper daily as needed for anxiety/agitation   amLODipine  (NORVASC ) 10 MG tablet Take 1 tablet (10 mg total) by mouth daily.   Bioflavonoid Products (BIOFLEX PO) Take by mouth daily.   clotrimazole-betamethasone (LOTRISONE) cream Apply 1 Application topically daily.   donepezil  (ARICEPT ) 10 MG tablet Take 1 tablet (10 mg total) by mouth at bedtime.   glimepiride  (AMARYL ) 2 MG tablet Take 1 tablet (2 mg total) by mouth daily. With breakfast   glucose blood (ONETOUCH  VERIO) test strip Use 1 test strip to check glucose one daily as needed for diabetes E11.65 (Patient not taking: Reported on 06/21/2024)   iron  polysaccharides (NIFEREX) 150 MG capsule Take 1 capsule (150 mg total) by mouth daily.   lisinopril  (ZESTRIL ) 40 MG tablet Take 1 tablet (40 mg total) by mouth daily.   meloxicam  (MOBIC ) 7.5 MG tablet Take 1 tablet (7.5 mg total) by mouth daily.   Multiple Vitamin (MULTIVITAMIN) capsule Take 1 capsule by mouth daily.   OneTouch Delica Lancets 33G MISC Use 1 lancet to check glucose once daily as needed for diabetes E11.65 (Patient not taking: Reported on 06/21/2024)   pioglitazone  (ACTOS ) 30 MG tablet Take 1 tablet (30 mg total) by mouth daily.   QUEtiapine  (SEROQUEL ) 50 MG tablet Take 1 tablet (50 mg total) by mouth 2 (two) times daily.   No facility-administered encounter medications on file as of 07/19/2024.    Surgical History: Past Surgical History:  Procedure Laterality Date   APPENDECTOMY  1960's   BREAST BIOPSY Right 02/28/2018    STROMAL FIBROSIS, 5 MM, AND LOBULAR INVOLUTION.   COLONOSCOPY WITH PROPOFOL  N/A 04/15/2016   Procedure: COLONOSCOPY WITH PROPOFOL ;  Surgeon: Lamar ONEIDA Holmes, MD;  Location: [facility] ENDOSCOPY;  Service: Endoscopy;  Laterality: N/A;   COLONOSCOPY WITH PROPOFOL  N/A 05/27/2020   Procedure: COLONOSCOPY WITH PROPOFOL ;  Surgeon: Jinny Carmine, MD;  Location: [facility] ENDOSCOPY;  Service: Endoscopy;  Laterality: N/A;   REFRACTIVE SURGERY     TONSILLECTOMY AND  ADENOIDECTOMY  1975    Medical History: Past Medical History:  Diagnosis Date   Basal cell carcinoma 07/17/2020   R lat canthus/lower eyelid ED&C    Diabetes mellitus without complication (HCC)    Hyperlipidemia    Hypertension     Family History: Family History  Problem Relation Age of Onset   Cancer Mother        lung   Thyroid  disease Mother    Cancer Father        prostate   Heart disease Father    Cancer Brother        prostate   Breast cancer Neg Hx      Social History   Socioeconomic History   Marital status: Divorced    Spouse name: Not on file   Number of children: 2   Years of education: Not on file   Highest education level: Some college, no degree  Occupational History   Occupation: retired   Occupation: owns manufacturing engineer  Tobacco Use   Smoking status: Never   Smokeless tobacco: Never  Vaping Use   Vaping status: Never Used  Substance and Sexual Activity   Alcohol use: Not Currently   Drug use: No   Sexual activity: Not Currently  Other Topics Concern   Not on file  Social History Narrative   Not on file   Social Drivers of Health   Financial Resource Strain: Low Risk  (09/20/2022)   Overall Financial Resource Strain (CARDIA)    Difficulty of Paying Living Expenses: Not hard at all  Food Insecurity: No Food Insecurity (09/20/2022)   Hunger Vital Sign    Worried About Running Out of Food in the Last Year: Never true    Ran Out of Food in the Last Year: Never true  Transportation Needs: No Transportation Needs (09/20/2022)   PRAPARE - Administrator, Civil Service (Medical): No    Lack of Transportation (Non-Medical): No  Physical Activity: Inactive (09/20/2022)   Exercise Vital Sign    Days of Exercise per Week: 0 days    Minutes of Exercise per Session: 0 min  Stress: No Stress Concern Present (09/20/2022)   [employer] of Occupational Health - Occupational Stress Questionnaire    Feeling of Stress : Not at all  Social Connections: Moderately Integrated (09/20/2022)   Social Connection and Isolation Panel    Frequency of Communication with Friends and Family: More than three times a week    Frequency of Social Gatherings with Friends and Family: Three times a week    Attends Religious Services: 1 to 4 times per year    Active Member of Clubs or Organizations: No    Attends Banker Meetings: Never    Marital Status: Living with partner  Intimate Partner Violence: Not At Risk  (09/20/2022)   Humiliation, Afraid, Rape, and Kick questionnaire    Fear of Current or Ex-Partner: No    Emotionally Abused: No    Physically Abused: No    Sexually Abused: No      Review of Systems  Constitutional:  Positive for fatigue. Negative for chills and unexpected weight change.  HENT:  Negative for congestion, postnasal drip, rhinorrhea, sneezing and sore throat.   Eyes:  Negative for redness.  Respiratory: Negative.  Negative for cough, chest tightness, shortness of breath and wheezing.   Cardiovascular: Negative.  Negative for chest pain and palpitations.  Gastrointestinal: Negative.  Negative for abdominal pain, constipation, diarrhea, nausea and vomiting.  Genitourinary:  Positive for dysuria, flank pain, frequency and urgency.  Musculoskeletal:  Negative for arthralgias, back pain, joint swelling and neck pain.  Skin:  Negative for rash.  Neurological: Negative.  Negative for tremors and numbness.  Hematological:  Negative for adenopathy. Does not bruise/bleed easily.  Psychiatric/Behavioral:  Positive for agitation, confusion and sleep disturbance. Negative for behavioral problems (Depression), self-injury and suicidal ideas. The patient is nervous/anxious.     Vital Signs: BP (!) 150/70 Comment: 164/72  Pulse 66   Temp (!) 97.2 F (36.2 C)   Resp 16   Ht 5' 1 (1.549 m)   Wt 199 lb 9.6 oz (90.5 kg)   SpO2 96%   BMI 37.71 kg/m    Physical Exam Vitals reviewed.  Constitutional:      Appearance: Normal appearance. She is obese. She is not ill-appearing.  HENT:     Head: Normocephalic and atraumatic.  Eyes:     Pupils: Pupils are equal, round, and reactive to light.  Cardiovascular:     Rate and Rhythm: Normal rate and regular rhythm.     Heart sounds: Normal heart sounds.  Pulmonary:     Effort: Pulmonary effort is normal. No respiratory distress.     Breath sounds: Normal breath sounds. No wheezing.  Musculoskeletal:     Cervical back: Normal range of  motion.  Skin:    General: Skin is warm and dry.     Capillary Refill: Capillary refill takes less than 2 seconds.  Neurological:     Mental Status: She is alert. Mental status is at baseline.     Cranial Nerves: No cranial nerve deficit.     Coordination: Coordination normal.     Gait: Gait normal.  Psychiatric:        Mood and Affect: Mood normal.        Behavior: Behavior normal.        Assessment/Plan: 1. Essential hypertension (Primary) Start hydralazine  as prescribed. Follow up in 4 weeks to reassess blood pressure  - hydrALAZINE  (APRESOLINE ) 10 MG tablet; Take 1 tablet (10 mg total) by mouth in the morning and at bedtime.  Dispense: 60 tablet; Refill: 5  2. Urinary tract infection without hematuria, site unspecified Bactrim  prescribed, take until gone.  - sulfamethoxazole -trimethoprim  (BACTRIM  DS) 800-160 MG tablet; Take 1 tablet by mouth 2 (two) times daily for 5 days. Take with food  Dispense: 10 tablet; Refill: 0  3. Insomnia due to medical condition Continue quetiapine  as prescribed.   4. History of recent fall Use assistive devices as directed.   5. Moderate dementia with agitation, unspecified dementia type (HCC) Continue medications as prescribed.    General Counseling: Anne Parrish understanding of the findings of todays visit and agrees with plan of treatment. I have discussed any further diagnostic evaluation that may be needed or ordered today. We also reviewed her medications today. she has been encouraged to call the office with any questions or concerns that should arise related to todays visit.    No orders of the defined types were placed in this encounter.   Meds ordered this encounter  Medications   sulfamethoxazole -trimethoprim  (BACTRIM  DS) 800-160 MG tablet    Sig: Take 1 tablet by mouth 2 (two) times daily for 5 days. Take with food    Dispense:  10 tablet    Refill:  0    Fill new script today   hydrALAZINE  (APRESOLINE ) 10 MG tablet     Sig: Take 1 tablet (10 mg total)  by mouth in the morning and at bedtime.    Dispense:  60 tablet    Refill:  5    Fill new script today    Return in about 4 weeks (around 08/16/2024) for F/U, eval new med, BP check, Sunjai Levandoski PCP.   Total time spent:30 Minutes Time spent includes review of chart, medications, test results, and follow up plan with the patient.   [place] Controlled Substance Database was reviewed by me.  This patient was seen by Mardy Maxin, FNP-C in collaboration with Dr. Sigrid Bathe as a part of collaborative care agreement.   Sultan Pargas R. Maxin, MSN, FNP-C Internal medicine

## 2024-08-13 ENCOUNTER — Encounter: Payer: Self-pay | Admitting: Nurse Practitioner

## 2024-08-16 ENCOUNTER — Ambulatory Visit (INDEPENDENT_AMBULATORY_CARE_PROVIDER_SITE_OTHER): Admitting: Nurse Practitioner

## 2024-08-16 ENCOUNTER — Encounter: Payer: Self-pay | Admitting: Nurse Practitioner

## 2024-08-16 VITALS — BP 130/69 | HR 60 | Temp 95.8°F | Resp 16 | Ht 61.0 in | Wt 203.8 lb

## 2024-08-16 DIAGNOSIS — E1169 Type 2 diabetes mellitus with other specified complication: Secondary | ICD-10-CM | POA: Diagnosis not present

## 2024-08-16 DIAGNOSIS — G4701 Insomnia due to medical condition: Secondary | ICD-10-CM

## 2024-08-16 DIAGNOSIS — Z9181 History of falling: Secondary | ICD-10-CM

## 2024-08-16 DIAGNOSIS — F03B11 Unspecified dementia, moderate, with agitation: Secondary | ICD-10-CM

## 2024-08-16 DIAGNOSIS — E1159 Type 2 diabetes mellitus with other circulatory complications: Secondary | ICD-10-CM | POA: Diagnosis not present

## 2024-08-16 DIAGNOSIS — I152 Hypertension secondary to endocrine disorders: Secondary | ICD-10-CM

## 2024-08-16 MED ORDER — GLIMEPIRIDE 2 MG PO TABS: 2.0000 mg | ORAL_TABLET | Freq: Every day | ORAL | 1 refills | Status: AC

## 2024-08-16 MED ORDER — HYDRALAZINE HCL 10 MG PO TABS: 10.0000 mg | ORAL_TABLET | Freq: Two times a day (BID) | ORAL | 3 refills | Status: AC

## 2024-08-16 MED ORDER — AMLODIPINE BESYLATE 10 MG PO TABS: 10.0000 mg | ORAL_TABLET | Freq: Every day | ORAL | 1 refills | Status: AC

## 2024-08-16 NOTE — Progress Notes (Signed)
 [facility] [address] [place], [place] [address]  Internal MEDICINE  Office Visit Note  Patient Name: Anne Parrish  [id number]  [id number]  Date of Service: 08/16/2024  Chief Complaint  Patient presents with   Diabetes   Hypertension   Hyperlipidemia   Follow-up    HPI Lashandra presents for a follow-up visit for dementia, insomnia, wandering, hypertension and refills.  Dementia with increased wandering at night, increased insomnia, and increased aggression. She is starting to try to hit others sometimes Hypertension -- BP is improved with the addition of hydralazine . Refills today Patient has had another fall about 4 days ago. She did hit her head but has not had any nausea, vomiting, dizziness, or altered level of consciousness. She had now had 2 falls without serious injuries. Medications that can help with her agitation and insomnia have side effects of dizziness, drowsiness and will increase her risk for falls. We need assistance with medication management from neurology.    Current Medication: Outpatient Encounter Medications as of 08/16/2024  Medication Sig   acetaminophen  (TYLENOL ) 650 MG CR tablet Take 650 mg by mouth every 8 (eight) hours as needed for pain.   ALPRAZolam  (XANAX ) 0.25 MG tablet Take 1 tablet by mouth at noon and 1 tablet with supper daily as needed for anxiety/agitation   amLODipine  (NORVASC ) 10 MG tablet Take 1 tablet (10 mg total) by mouth daily.   Bioflavonoid Products (BIOFLEX PO) Take by mouth daily.   clotrimazole-betamethasone (LOTRISONE) cream Apply 1 Application topically daily.   donepezil  (ARICEPT ) 10 MG tablet Take 1 tablet (10 mg total) by mouth at bedtime.   glimepiride  (AMARYL ) 2 MG tablet Take 1 tablet (2 mg total) by mouth daily. With breakfast   glucose blood (ONETOUCH VERIO) test strip Use 1 test strip to check glucose one daily as needed for diabetes E11.65 (Patient not taking: Reported on 06/21/2024)   hydrALAZINE   (APRESOLINE ) 10 MG tablet Take 1 tablet (10 mg total) by mouth in the morning and at bedtime.   iron  polysaccharides (NIFEREX) 150 MG capsule Take 1 capsule (150 mg total) by mouth daily.   lisinopril  (ZESTRIL ) 40 MG tablet Take 1 tablet (40 mg total) by mouth daily.   meloxicam  (MOBIC ) 7.5 MG tablet Take 1 tablet (7.5 mg total) by mouth daily.   Multiple Vitamin (MULTIVITAMIN) capsule Take 1 capsule by mouth daily.   OneTouch Delica Lancets 33G MISC Use 1 lancet to check glucose once daily as needed for diabetes E11.65 (Patient not taking: Reported on 06/21/2024)   pioglitazone  (ACTOS ) 30 MG tablet Take 1 tablet (30 mg total) by mouth daily.   QUEtiapine  (SEROQUEL ) 50 MG tablet Take 1 tablet (50 mg total) by mouth 2 (two) times daily.   [DISCONTINUED] amLODipine  (NORVASC ) 10 MG tablet Take 1 tablet (10 mg total) by mouth daily.   [DISCONTINUED] glimepiride  (AMARYL ) 2 MG tablet Take 1 tablet (2 mg total) by mouth daily. With breakfast   [DISCONTINUED] hydrALAZINE  (APRESOLINE ) 10 MG tablet Take 1 tablet (10 mg total) by mouth in the morning and at bedtime.   No facility-administered encounter medications on file as of 08/16/2024.    Surgical History: Past Surgical History:  Procedure Laterality Date   APPENDECTOMY  1960's   BREAST BIOPSY Right 02/28/2018    STROMAL FIBROSIS, 5 MM, AND LOBULAR INVOLUTION.   COLONOSCOPY WITH PROPOFOL  N/A 04/15/2016   Procedure: COLONOSCOPY WITH PROPOFOL ;  Surgeon: Lamar ONEIDA Holmes, MD;  Location: [facility] ENDOSCOPY;  Service: Endoscopy;  Laterality: N/A;  COLONOSCOPY WITH PROPOFOL  N/A 05/27/2020   Procedure: COLONOSCOPY WITH PROPOFOL ;  Surgeon: Jinny Carmine, MD;  Location: [facility] ENDOSCOPY;  Service: Endoscopy;  Laterality: N/A;   REFRACTIVE SURGERY     TONSILLECTOMY AND ADENOIDECTOMY  1975    Medical History: Past Medical History:  Diagnosis Date   Basal cell carcinoma 07/17/2020   R lat canthus/lower eyelid ED&C    Diabetes mellitus without complication (HCC)     Hyperlipidemia    Hypertension     Family History: Family History  Problem Relation Age of Onset   Cancer Mother        lung   Thyroid  disease Mother    Cancer Father        prostate   Heart disease Father    Cancer Brother        prostate   Breast cancer Neg Hx     Social History   Socioeconomic History   Marital status: Divorced    Spouse name: Not on file   Number of children: 2   Years of education: Not on file   Highest education level: Some college, no degree  Occupational History   Occupation: retired   Occupation: owns manufacturing engineer  Tobacco Use   Smoking status: Never   Smokeless tobacco: Never  Vaping Use   Vaping status: Never Used  Substance and Sexual Activity   Alcohol use: Not Currently   Drug use: No   Sexual activity: Not Currently  Other Topics Concern   Not on file  Social History Narrative   Not on file   Social Drivers of Health   Financial Resource Strain: Low Risk  (09/20/2022)   Overall Financial Resource Strain (CARDIA)    Difficulty of Paying Living Expenses: Not hard at all  Food Insecurity: No Food Insecurity (09/20/2022)   Hunger Vital Sign    Worried About Running Out of Food in the Last Year: Never true    Ran Out of Food in the Last Year: Never true  Transportation Needs: No Transportation Needs (09/20/2022)   PRAPARE - Administrator, Civil Service (Medical): No    Lack of Transportation (Non-Medical): No  Physical Activity: Inactive (09/20/2022)   Exercise Vital Sign    Days of Exercise per Week: 0 days    Minutes of Exercise per Session: 0 min  Stress: No Stress Concern Present (09/20/2022)   [employer] of Occupational Health - Occupational Stress Questionnaire    Feeling of Stress : Not at all  Social Connections: Moderately Integrated (09/20/2022)   Social Connection and Isolation Panel    Frequency of Communication with Friends and Family: More than three times a week    Frequency of Social Gatherings  with Friends and Family: Three times a week    Attends Religious Services: 1 to 4 times per year    Active Member of Clubs or Organizations: No    Attends Banker Meetings: Never    Marital Status: Living with partner  Intimate Partner Violence: Not At Risk (09/20/2022)   Humiliation, Afraid, Rape, and Kick questionnaire    Fear of Current or Ex-Partner: No    Emotionally Abused: No    Physically Abused: No    Sexually Abused: No      Review of Systems  Constitutional:  Positive for fatigue. Negative for chills and unexpected weight change.  HENT:  Negative for congestion, postnasal drip, rhinorrhea, sneezing and sore throat.   Eyes:  Negative for redness.  Respiratory: Negative.  Negative for cough, chest tightness, shortness of breath and wheezing.   Cardiovascular: Negative.  Negative for chest pain and palpitations.  Gastrointestinal: Negative.  Negative for abdominal pain, constipation, diarrhea, nausea and vomiting.  Genitourinary:  Negative for dysuria, flank pain, frequency and urgency.  Musculoskeletal:  Negative for arthralgias, back pain, joint swelling and neck pain.  Skin:  Negative for rash.  Neurological: Negative.  Negative for tremors and numbness.  Hematological:  Negative for adenopathy. Does not bruise/bleed easily.  Psychiatric/Behavioral:  Positive for agitation, confusion and sleep disturbance. Negative for behavioral problems (Depression), self-injury and suicidal ideas. The patient is nervous/anxious.     Vital Signs: BP 130/69   Pulse 60   Temp (!) 95.8 F (35.4 C)   Resp 16   Ht 5' 1 (1.549 m)   Wt 203 lb 12.8 oz (92.4 kg)   SpO2 99%   BMI 38.51 kg/m    Physical Exam Vitals reviewed.  Constitutional:      Appearance: Normal appearance. She is obese. She is not ill-appearing.  HENT:     Head: Normocephalic and atraumatic.  Eyes:     Pupils: Pupils are equal, round, and reactive to light.  Cardiovascular:     Rate and Rhythm:  Normal rate and regular rhythm.     Heart sounds: Normal heart sounds.  Pulmonary:     Effort: Pulmonary effort is normal. No respiratory distress.     Breath sounds: Normal breath sounds. No wheezing.  Musculoskeletal:     Cervical back: Normal range of motion.  Skin:    General: Skin is warm and dry.     Capillary Refill: Capillary refill takes less than 2 seconds.  Neurological:     Mental Status: She is alert. Mental status is at baseline.     Cranial Nerves: No cranial nerve deficit.     Coordination: Coordination normal.     Gait: Gait normal.  Psychiatric:        Mood and Affect: Mood normal.        Behavior: Behavior normal.        Assessment/Plan: 1. Type 2 diabetes mellitus with other specified complication, without long-term current use of insulin  (HCC) (Primary) Continue medications as prescribed.  - glimepiride  (AMARYL ) 2 MG tablet; Take 1 tablet (2 mg total) by mouth daily. With breakfast  Dispense: 90 tablet; Refill: 1  2. Hypertension associated with type 2 diabetes mellitus (HCC) Stable, Continue medications as prescribed.  - hydrALAZINE  (APRESOLINE ) 10 MG tablet; Take 1 tablet (10 mg total) by mouth in the morning and at bedtime.  Dispense: 180 tablet; Refill: 3 - amLODipine  (NORVASC ) 10 MG tablet; Take 1 tablet (10 mg total) by mouth daily.  Dispense: 90 tablet; Refill: 1  3. Insomnia due to medical condition Referred to neurology  - Ambulatory referral to Neurology  4. Obesity, morbid (HCC) Continue diabetic diet   5. History of recent fall Continue to use walker and family is present to provider assistance to patient   6. Moderate dementia with agitation, unspecified dementia type [facility]) Referred to neurology - Ambulatory referral to Neurology   General Counseling: [place] understanding of the findings of todays visit and agrees with plan of treatment. I have discussed any further diagnostic evaluation that may be needed or ordered today.  We also reviewed her medications today. she has been encouraged to call the office with any questions or concerns that should arise related to todays visit.    Orders Placed This Encounter  Procedures   Ambulatory referral to Neurology    Meds ordered this encounter  Medications   hydrALAZINE  (APRESOLINE ) 10 MG tablet    Sig: Take 1 tablet (10 mg total) by mouth in the morning and at bedtime.    Dispense:  180 tablet    Refill:  3    Fill new script today   amLODipine  (NORVASC ) 10 MG tablet    Sig: Take 1 tablet (10 mg total) by mouth daily.    Dispense:  90 tablet    Refill:  1    Patient request 90 day supply   glimepiride  (AMARYL ) 2 MG tablet    Sig: Take 1 tablet (2 mg total) by mouth daily. With breakfast    Dispense:  90 tablet    Refill:  1    For future refills, 90 day supply please    Return in about 2 months (around 10/17/2024) for F/U, Suzy Kugel PCP.   Total time spent:30 Minutes Time spent includes review of chart, medications, test results, and follow up plan with the patient.   [place] Controlled Substance Database was reviewed by me.  This patient was seen by Mardy Maxin, FNP-C in collaboration with Dr. Sigrid Bathe as a part of collaborative care agreement.   Rozina Pointer R. Maxin, MSN, FNP-C Internal medicine

## 2024-08-17 ENCOUNTER — Encounter: Payer: Self-pay | Admitting: Physician Assistant

## 2024-09-21 ENCOUNTER — Telehealth: Payer: Self-pay

## 2024-09-25 MED ORDER — SULFAMETHOXAZOLE-TRIMETHOPRIM 800-160 MG PO TABS: 1.0000 | ORAL_TABLET | Freq: Two times a day (BID) | ORAL | 0 refills | Status: AC

## 2024-09-25 NOTE — Telephone Encounter (Signed)
 Patient daughter notified

## 2024-10-12 ENCOUNTER — Other Ambulatory Visit: Payer: Self-pay | Admitting: Nurse Practitioner

## 2024-10-12 DIAGNOSIS — F03B11 Unspecified dementia, moderate, with agitation: Secondary | ICD-10-CM

## 2024-10-12 DIAGNOSIS — G4701 Insomnia due to medical condition: Secondary | ICD-10-CM

## 2024-10-18 ENCOUNTER — Ambulatory Visit: Admitting: Nurse Practitioner

## 2024-10-24 ENCOUNTER — Ambulatory Visit

## 2024-10-24 ENCOUNTER — Ambulatory Visit: Admitting: Physician Assistant

## 2024-10-30 ENCOUNTER — Ambulatory Visit: Admitting: Nurse Practitioner

## 2025-01-28 ENCOUNTER — Ambulatory Visit: Admitting: Nurse Practitioner

## 2025-11-14 ENCOUNTER — Ambulatory Visit (INDEPENDENT_AMBULATORY_CARE_PROVIDER_SITE_OTHER): Admitting: Vascular Surgery
